# Patient Record
Sex: Male | Born: 1940
Health system: Southern US, Community
[De-identification: ages and names within clinical notes are randomized; demographics above are authoritative.]

## PROBLEM LIST (undated history)

## (undated) DIAGNOSIS — D689 Coagulation defect, unspecified: Secondary | ICD-10-CM

## (undated) DIAGNOSIS — D6862 Lupus anticoagulant syndrome: Secondary | ICD-10-CM

## (undated) DIAGNOSIS — Q211 Atrial septal defect: Secondary | ICD-10-CM

## (undated) DIAGNOSIS — Q2112 Patent foramen ovale: Secondary | ICD-10-CM

## (undated) DIAGNOSIS — I10 Essential (primary) hypertension: Secondary | ICD-10-CM

## (undated) DIAGNOSIS — R911 Solitary pulmonary nodule: Secondary | ICD-10-CM

## (undated) DIAGNOSIS — E785 Hyperlipidemia, unspecified: Secondary | ICD-10-CM

## (undated) DIAGNOSIS — Z87442 Personal history of urinary calculi: Secondary | ICD-10-CM

## (undated) DIAGNOSIS — M109 Gout, unspecified: Secondary | ICD-10-CM

## (undated) HISTORY — DX: Coagulation defect, unspecified: D68.9

## (undated) HISTORY — DX: Solitary pulmonary nodule: R91.1

## (undated) HISTORY — DX: Hyperlipidemia, unspecified: E78.5

## (undated) HISTORY — DX: Personal history of urinary calculi: Z87.442

## (undated) HISTORY — DX: Patent foramen ovale: Q21.12

## (undated) HISTORY — DX: Gout, unspecified: M10.9

## (undated) HISTORY — PX: THROMBECTOMY: SHX45

## (undated) HISTORY — DX: Atrial septal defect: Q21.1

## (undated) HISTORY — DX: Lupus anticoagulant syndrome: D68.62

## (undated) HISTORY — DX: Essential (primary) hypertension: I10

---

## 1999-05-26 DIAGNOSIS — Z87442 Personal history of urinary calculi: Secondary | ICD-10-CM

## 1999-05-26 HISTORY — DX: Personal history of urinary calculi: Z87.442

## 2008-02-01 ENCOUNTER — Ambulatory Visit: Payer: Self-pay | Admitting: Gastroenterology

## 2008-02-01 LAB — HM COLONOSCOPY: HM Colonoscopy: NORMAL

## 2009-05-25 DIAGNOSIS — R911 Solitary pulmonary nodule: Secondary | ICD-10-CM

## 2009-05-25 DIAGNOSIS — D689 Coagulation defect, unspecified: Secondary | ICD-10-CM

## 2009-05-25 HISTORY — DX: Solitary pulmonary nodule: R91.1

## 2009-05-25 HISTORY — DX: Coagulation defect, unspecified: D68.9

## 2009-12-23 ENCOUNTER — Ambulatory Visit: Payer: Self-pay | Admitting: Internal Medicine

## 2010-01-19 ENCOUNTER — Inpatient Hospital Stay: Payer: Self-pay | Admitting: Vascular Surgery

## 2010-01-21 LAB — PATHOLOGY REPORT

## 2010-01-23 ENCOUNTER — Ambulatory Visit: Payer: Self-pay | Admitting: Internal Medicine

## 2010-01-24 ENCOUNTER — Ambulatory Visit: Payer: Self-pay | Admitting: Internal Medicine

## 2010-02-04 ENCOUNTER — Ambulatory Visit: Payer: Self-pay | Admitting: Internal Medicine

## 2010-02-08 LAB — CEA: CEA: 1.6 ng/mL (ref 0.0–4.7)

## 2010-02-08 LAB — PSA

## 2010-02-22 ENCOUNTER — Ambulatory Visit: Payer: Self-pay | Admitting: Internal Medicine

## 2010-03-25 ENCOUNTER — Ambulatory Visit: Payer: Self-pay | Admitting: Internal Medicine

## 2010-04-24 ENCOUNTER — Ambulatory Visit: Payer: Self-pay | Admitting: Internal Medicine

## 2010-06-04 ENCOUNTER — Ambulatory Visit: Payer: Self-pay | Admitting: Internal Medicine

## 2010-06-11 ENCOUNTER — Ambulatory Visit: Payer: Self-pay | Admitting: Internal Medicine

## 2010-06-25 ENCOUNTER — Ambulatory Visit: Payer: Self-pay | Admitting: Internal Medicine

## 2010-07-24 ENCOUNTER — Ambulatory Visit: Payer: Self-pay | Admitting: Internal Medicine

## 2010-08-24 ENCOUNTER — Ambulatory Visit: Payer: Self-pay | Admitting: Internal Medicine

## 2010-10-08 ENCOUNTER — Ambulatory Visit: Payer: Self-pay | Admitting: Internal Medicine

## 2010-10-24 ENCOUNTER — Ambulatory Visit: Payer: Self-pay | Admitting: Internal Medicine

## 2011-04-22 ENCOUNTER — Ambulatory Visit (INDEPENDENT_AMBULATORY_CARE_PROVIDER_SITE_OTHER): Payer: Medicare Other | Admitting: Internal Medicine

## 2011-04-22 ENCOUNTER — Encounter: Payer: Self-pay | Admitting: Internal Medicine

## 2011-04-22 DIAGNOSIS — Z7901 Long term (current) use of anticoagulants: Secondary | ICD-10-CM

## 2011-04-22 DIAGNOSIS — Z79899 Other long term (current) drug therapy: Secondary | ICD-10-CM

## 2011-04-22 DIAGNOSIS — D689 Coagulation defect, unspecified: Secondary | ICD-10-CM | POA: Insufficient documentation

## 2011-04-22 DIAGNOSIS — R911 Solitary pulmonary nodule: Secondary | ICD-10-CM | POA: Insufficient documentation

## 2011-04-22 DIAGNOSIS — Z23 Encounter for immunization: Secondary | ICD-10-CM

## 2011-04-22 DIAGNOSIS — G4733 Obstructive sleep apnea (adult) (pediatric): Secondary | ICD-10-CM | POA: Insufficient documentation

## 2011-04-22 DIAGNOSIS — M109 Gout, unspecified: Secondary | ICD-10-CM | POA: Insufficient documentation

## 2011-04-22 DIAGNOSIS — I10 Essential (primary) hypertension: Secondary | ICD-10-CM

## 2011-04-22 DIAGNOSIS — Z1211 Encounter for screening for malignant neoplasm of colon: Secondary | ICD-10-CM | POA: Insufficient documentation

## 2011-04-22 DIAGNOSIS — Z87442 Personal history of urinary calculi: Secondary | ICD-10-CM | POA: Insufficient documentation

## 2011-04-22 NOTE — Patient Instructions (Addendum)
Return for your labs next week and we will call you with the results   You received your pneumonia vaccine today  Check with ble cross about the TdaP vaccine

## 2011-04-22 NOTE — Progress Notes (Signed)
Subjective:    Patient ID: Stephen Kelly, male    DOB: 09/08/1940, 70 y.o.   MRN: [id number]  HPI  Mr. Provencal is a 69 yo male who is here to establish care, referred by his son. He has a  history of a left upper arm DVT brachiocephalic vein which occurred spontaneously in January 19, 2010 treated with embolectomy by Festus Barren.  He continues to take anticoagulantsas advised by Dr. 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  His INRs have been between 2.0 and 3.0 consistently since Oct 2011, per review of list provided by patient today.  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  He also reports a history of an abnormal finding on ECHO , possibly a PFO (?) , which was reviewed by Arnoldo Hooker during ER evaluation.   Past Medical History  Diagnosis Date  . Clotting disorder 2011    history of DVT,   . Hypertension   . History of renal calculi 2001    history of lithotripsy ,  no stent  . Gout, arthritis     managed with allopurinol  . Pulmonary nodule, right 2011    unchanged, followed by gitten with serial ct   No current outpatient prescriptions on file prior to visit.     Review of Systems  Constitutional: Negative for fever, chills, diaphoresis, activity change, appetite change, fatigue and unexpected weight change.  HENT: Negative for hearing loss, ear pain, nosebleeds, congestion, sore throat, facial swelling, rhinorrhea, sneezing, drooling, mouth sores, trouble swallowing, neck pain, neck stiffness, dental problem, voice change, postnasal drip, sinus pressure, tinnitus and ear discharge.   Eyes: Negative for photophobia, pain, discharge, redness, itching and visual disturbance.  Respiratory: Negative for apnea, cough, choking, chest tightness, shortness of breath, wheezing and stridor.   Cardiovascular: Negative for chest pain, palpitations and leg swelling.  Gastrointestinal: Negative for nausea, vomiting, abdominal pain,  diarrhea, constipation, blood in stool, abdominal distention, anal bleeding and rectal pain.  Genitourinary: Negative for dysuria, urgency, frequency, hematuria, flank pain, decreased urine volume, scrotal swelling, difficulty urinating and testicular pain.  Musculoskeletal: Negative for myalgias, back pain, joint swelling, arthralgias and gait problem.  Skin: Negative for color change, rash and wound.  Neurological: Negative for dizziness, tremors, seizures, syncope, speech difficulty, weakness, light-headedness, numbness and headaches.  Psychiatric/Behavioral: Negative for suicidal ideas, hallucinations, behavioral problems, confusion, sleep disturbance, dysphoric mood, decreased concentration and agitation. The patient is not nervous/anxious.        Objective:   Physical Exam  Constitutional: He is oriented to person, place, and time.  HENT:  Head: Normocephalic and atraumatic.  Mouth/Throat: Oropharynx is clear and moist.  Eyes: Conjunctivae and EOM are normal.  Neck: Normal range of motion. Neck supple. No JVD present. No thyromegaly present.  Cardiovascular: Normal rate, regular rhythm and normal heart sounds.   Pulmonary/Chest: Effort normal and breath sounds normal. He has no wheezes. He has no rales.  Abdominal: Soft. Bowel sounds are normal. He exhibits no mass. There is no tenderness. There is no rebound.  Musculoskeletal: Normal range of motion. He exhibits no edema.  Neurological: He is alert and oriented to person, place, and time.  Skin: Skin is warm and dry.     Psychiatric: He has a normal mood and affect.          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  Records from [facility] will be requested to determine if he has a protein deficiency Factor V Leiden mutation, or AT III deficiency. I will manage his coumadin with monthly PT/INR going forward.  Hypertension Well  controlled on current medications.  No changes today.  Sleep apnea, obstructive He has not used CPAP in years since he lost 30 lbs.  He i snot interested in repeat sleep study at this time since he has good energy, restful sleep, and controlled HTN.     Updated Medication List Outpatient Encounter Prescriptions as of 04/22/2011  Medication Sig Dispense Refill  . allopurinol (ZYLOPRIM) 100 MG tablet Take 100 mg by mouth daily.        . calcium carbonate (OS-CAL) 600 MG TABS Take 600 mg by mouth 2 (two) times daily with a meal.        . fish oil-omega-3 fatty acids 1000 MG capsule Take 2 g by mouth daily.        [name] lisinopril-hydrochlorothiazide (PRINZIDE,ZESTORETIC) 10-12.5 MG per tablet Take 1 tablet by mouth daily.        . Multiple Vitamin (MULTIVITAMIN) tablet Take 1 tablet by mouth daily.        [name] warfarin (COUMADIN) 2.5 MG tablet Take 2.5 mg by mouth daily.

## 2011-04-23 ENCOUNTER — Encounter: Payer: Self-pay | Admitting: Internal Medicine

## 2011-04-23 DIAGNOSIS — I10 Essential (primary) hypertension: Secondary | ICD-10-CM | POA: Insufficient documentation

## 2011-04-23 NOTE — Assessment & Plan Note (Signed)
He has not used CPAP in years since he lost 30 lbs.  He i snot interested in repeat sleep study at this time since he has good energy, restful sleep, and controlled HTN.

## 2011-04-23 NOTE — Assessment & Plan Note (Signed)
Well controlled on current medications.  No changes today. 

## 2011-04-23 NOTE — Assessment & Plan Note (Signed)
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  Records from [facility] will be requested to determine if he has a protein deficiency Factor V Leiden mutation, or AT III deficiency. I will manage his coumadin with monthly PT/INR going forward.

## 2011-04-29 ENCOUNTER — Other Ambulatory Visit (INDEPENDENT_AMBULATORY_CARE_PROVIDER_SITE_OTHER): Payer: Medicare Other | Admitting: *Deleted

## 2011-04-29 DIAGNOSIS — Z79899 Other long term (current) drug therapy: Secondary | ICD-10-CM

## 2011-04-29 DIAGNOSIS — Z7901 Long term (current) use of anticoagulants: Secondary | ICD-10-CM

## 2011-04-29 DIAGNOSIS — Z23 Encounter for immunization: Secondary | ICD-10-CM

## 2011-04-29 LAB — COMPREHENSIVE METABOLIC PANEL
ALT: 24 U/L (ref 0–53)
AST: 23 U/L (ref 0–37)
Albumin: 3.9 g/dL (ref 3.5–5.2)
Alkaline Phosphatase: 39 U/L (ref 39–117)
BUN: 24 mg/dL — ABNORMAL HIGH (ref 6–23)
CO2: 29 mEq/L (ref 19–32)
Calcium: 9.1 mg/dL (ref 8.4–10.5)
Chloride: 104 mEq/L (ref 96–112)
Creatinine, Ser: 1.3 mg/dL (ref 0.4–1.5)
GFR: 56.05 mL/min — ABNORMAL LOW (ref >60.00)
Glucose, Bld: 127 mg/dL — ABNORMAL HIGH (ref 70–99)
Potassium: 3.9 mEq/L (ref 3.5–5.1)
Sodium: 139 mEq/L (ref 135–145)
Total Bilirubin: 0.5 mg/dL (ref 0.3–1.2)
Total Protein: 6.8 g/dL (ref 6.0–8.3)

## 2011-04-29 LAB — PROTIME-INR
INR: 2.2 ratio — ABNORMAL HIGH (ref 0.8–1.0)
Prothrombin Time: 24 s — ABNORMAL HIGH (ref 10.2–12.4)

## 2011-04-29 NOTE — Progress Notes (Signed)
Addended by: Melody Comas L on: 04/29/2011 11:46 AM   Modules accepted: Orders

## 2011-05-06 ENCOUNTER — Other Ambulatory Visit (INDEPENDENT_AMBULATORY_CARE_PROVIDER_SITE_OTHER): Payer: Medicare Other | Admitting: *Deleted

## 2011-05-06 DIAGNOSIS — Z131 Encounter for screening for diabetes mellitus: Secondary | ICD-10-CM

## 2011-05-06 DIAGNOSIS — E785 Hyperlipidemia, unspecified: Secondary | ICD-10-CM

## 2011-05-06 LAB — LIPID PANEL
Cholesterol: 168 mg/dL (ref 0–200)
HDL: 35.2 mg/dL — ABNORMAL LOW (ref >39.00)
Total CHOL/HDL Ratio: 5
Triglycerides: 232 mg/dL — ABNORMAL HIGH (ref 0.0–149.0)
VLDL: 46.4 mg/dL — ABNORMAL HIGH (ref 0.0–40.0)

## 2011-05-06 LAB — HEMOGLOBIN A1C
Hgb A1c MFr Bld: 6.5 % — ABNORMAL HIGH (ref <5.7)
Mean Plasma Glucose: 140 mg/dL — ABNORMAL HIGH (ref <117)

## 2011-05-06 LAB — POCT CBG (FASTING - GLUCOSE)-MANUAL ENTRY: Glucose Fasting, POC: 107 mg/dL — AB (ref 70–99)

## 2011-05-07 LAB — LDL CHOLESTEROL, DIRECT: Direct LDL: 84.5 mg/dL

## 2011-05-27 ENCOUNTER — Telehealth: Payer: Self-pay | Admitting: Internal Medicine

## 2011-05-27 NOTE — Telephone Encounter (Signed)
[phone] Pt called wanted to be seen today.  He has been coughing x3weeks.  Last night he started feeling congested in chest. No fever

## 2011-05-27 NOTE — Telephone Encounter (Signed)
Made appointment for 05/27/10 pt aware

## 2011-05-28 ENCOUNTER — Encounter: Payer: Self-pay | Admitting: Internal Medicine

## 2011-05-28 ENCOUNTER — Ambulatory Visit (INDEPENDENT_AMBULATORY_CARE_PROVIDER_SITE_OTHER): Payer: Medicare Other | Admitting: Internal Medicine

## 2011-05-28 ENCOUNTER — Encounter: Payer: Self-pay | Admitting: *Deleted

## 2011-05-28 VITALS — BP 120/70 | HR 76 | Temp 98.4°F | Wt 205.0 lb

## 2011-05-28 DIAGNOSIS — J40 Bronchitis, not specified as acute or chronic: Secondary | ICD-10-CM

## 2011-05-28 MED ORDER — AZITHROMYCIN 500 MG PO TABS: 500.0000 mg | ORAL_TABLET | Freq: Every day | ORAL | Status: AC

## 2011-05-28 MED ORDER — BENZONATATE 200 MG PO CAPS: 200.0000 mg | ORAL_CAPSULE | Freq: Two times a day (BID) | ORAL | Status: AC | PRN

## 2011-05-28 NOTE — Progress Notes (Signed)
Subjective:    Patient ID: Stephen Kelly, male    DOB: Sep 05, 1940, 71 y.o.   MRN: [id number]  HPI       Review of Systems     Objective:   Physical Exam  Constitutional: He is oriented to person, place, and time.  HENT:  Head: Normocephalic and atraumatic.  Mouth/Throat: Oropharynx is clear and moist.  Eyes: Conjunctivae and EOM are normal.  Neck: Normal range of motion. Neck supple. No JVD present. No thyromegaly present.  Cardiovascular: Normal rate, regular rhythm and normal heart sounds.   Pulmonary/Chest: Effort normal and breath sounds normal. He has no wheezes. He has no rales.  Abdominal: Soft. Bowel sounds are normal. He exhibits no mass. There is no tenderness. There is no rebound.  Musculoskeletal: Normal range of motion. He exhibits no edema.  Neurological: He is alert and oriented to person, place, and time.  Skin: Skin is warm and dry.  Psychiatric: He has a normal mood and affect.          Assessment & Plan:   Subjective:     Stephen Kelly is a 71 y.o. male who presents with new onset of Type 2 diabetes.. Current symptoms include: none. Patient denies foot ulcerations, increased appetite, nausea, polydipsia and visual disturbances. Evaluation to date has been: fasting blood sugar, fasting lipid panel and hemoglobin A1C. Home sugars: patient does not check sugars. Current treatments: no recent interventions. Last dilated eye exam one month ago.  He is being seen urgent for a 3 week history of cough which is worse during the day.[name]  He reports scratchy throat,  Sinuses are nonpainful and not congested.  Cough is nonproductive. No recent travel.  Denies chest pain, shortness of breath. No coughing after eating.    The following portions of the patient's history were reviewed and updated as appropriate: allergies, current medications, past family history, past medical history, past social history, past surgical history and problem list.  Review of Systems A  comprehensive review of systems was negative.    Objective:    Ears: normal TM's and external ear canals both ears Nose: Nares normal. Septum midline. Mucosa normal. No drainage or sinus tenderness. Throat: lips, mucosa, and tongue normal; teeth and gums normal Neck: JVD - 0 cm above sternal notch, no adenopathy, no carotid bruit, no JVD, supple, symmetrical, trachea midline and thyroid not enlarged, symmetric, no tenderness/mass/nodules Lungs: clear to auscultation bilaterally Chest wall: no tenderness Heart: regular rate and rhythm, S1, S2 normal, no murmur, click, rub or gallop    @DMFOOTEXAM @  Patient was not evaluated for proper footwear and sizing.  Laboratory: No components found with this basename: A1C      Assessment:    Diabetes mellitus Type II, under excellent control.    Bronchitis:  Given persistence of symptoms for 3 weeks, will treat with azithromycin x 1 week and obtain chest x ray if no improvement.  Will also consider suspending lisinopril if cxr is negative.   Updated Medication List Outpatient Encounter Prescriptions as of 05/28/2011  Medication Sig Dispense Refill  . allopurinol (ZYLOPRIM) 100 MG tablet Take 100 mg by mouth daily.        . calcium carbonate (OS-CAL) 600 MG TABS Take 600 mg by mouth 2 (two) times daily with a meal.        . fish oil-omega-3 fatty acids 1000 MG capsule Take 2 g by mouth daily.        [name] lisinopril-hydrochlorothiazide (PRINZIDE,ZESTORETIC) 10-12.5 MG  per tablet Take 1 tablet by mouth daily.        . Multiple Vitamin (MULTIVITAMIN) tablet Take 1 tablet by mouth daily.        [name] warfarin (COUMADIN) 2.5 MG tablet Take 2.5 mg by mouth daily.        [name] azithromycin (ZITHROMAX) 500 MG tablet Take 1 tablet (500 mg total) by mouth daily.  7 tablet  0  . benzonatate (TESSALON) 200 MG capsule Take 1 capsule (200 mg total) by mouth 2 (two) times daily as needed for cough.  20 capsule  0       Plan:    Discussed general issues about  diabetes pathophysiology and management. Counseling at today's visit: discussed the need for weight loss, focused on the need for regular aerobic exercise and discussed the advantages of a diet low in carbohydrates. Addressed ADA diet. Encouraged aerobic exercise. Reminded to get yearly retinal exam.

## 2011-05-28 NOTE — Patient Instructions (Signed)
Your diabetes is well controlled currently.  You do not need medications.    You need daily exercise for 20 minutes and limit your starches  To two servings per day.    Read about the low glycemic index  diet .    I am prescribing azithromycin 500 mg daily for 7 days.  If the cough does not resolve,  Call me to set up an x ray

## 2011-06-01 ENCOUNTER — Ambulatory Visit: Payer: Medicare Other | Admitting: Internal Medicine

## 2011-06-01 ENCOUNTER — Other Ambulatory Visit (INDEPENDENT_AMBULATORY_CARE_PROVIDER_SITE_OTHER): Payer: Medicare Other | Admitting: *Deleted

## 2011-06-01 DIAGNOSIS — Z7901 Long term (current) use of anticoagulants: Secondary | ICD-10-CM

## 2011-06-01 LAB — PROTIME-INR
INR: 2.4 ratio — ABNORMAL HIGH (ref 0.8–1.0)
Prothrombin Time: 26.7 s — ABNORMAL HIGH (ref 10.2–12.4)

## 2011-07-03 ENCOUNTER — Other Ambulatory Visit (INDEPENDENT_AMBULATORY_CARE_PROVIDER_SITE_OTHER): Payer: Medicare Other | Admitting: *Deleted

## 2011-07-03 DIAGNOSIS — Z7901 Long term (current) use of anticoagulants: Secondary | ICD-10-CM

## 2011-07-03 LAB — PROTIME-INR
INR: 2.2 ratio — ABNORMAL HIGH (ref 0.8–1.0)
Prothrombin Time: 24.4 s — ABNORMAL HIGH (ref 10.2–12.4)

## 2011-07-31 ENCOUNTER — Other Ambulatory Visit (INDEPENDENT_AMBULATORY_CARE_PROVIDER_SITE_OTHER): Payer: Medicare Other

## 2011-07-31 DIAGNOSIS — Z7901 Long term (current) use of anticoagulants: Secondary | ICD-10-CM

## 2011-07-31 LAB — PROTIME-INR
INR: 1.9 ratio — ABNORMAL HIGH (ref 0.8–1.0)
Prothrombin Time: 20.6 s — ABNORMAL HIGH (ref 10.2–12.4)

## 2011-08-31 ENCOUNTER — Ambulatory Visit (INDEPENDENT_AMBULATORY_CARE_PROVIDER_SITE_OTHER): Payer: Medicare Other | Admitting: Internal Medicine

## 2011-08-31 ENCOUNTER — Encounter: Payer: Self-pay | Admitting: Internal Medicine

## 2011-08-31 VITALS — BP 124/62 | HR 70 | Temp 97.8°F | Resp 16 | Wt 205.8 lb

## 2011-08-31 DIAGNOSIS — I10 Essential (primary) hypertension: Secondary | ICD-10-CM

## 2011-08-31 DIAGNOSIS — Q2112 Patent foramen ovale: Secondary | ICD-10-CM | POA: Insufficient documentation

## 2011-08-31 DIAGNOSIS — E1121 Type 2 diabetes mellitus with diabetic nephropathy: Secondary | ICD-10-CM | POA: Insufficient documentation

## 2011-08-31 DIAGNOSIS — Q211 Atrial septal defect: Secondary | ICD-10-CM | POA: Insufficient documentation

## 2011-08-31 DIAGNOSIS — Z7901 Long term (current) use of anticoagulants: Secondary | ICD-10-CM

## 2011-08-31 DIAGNOSIS — R911 Solitary pulmonary nodule: Secondary | ICD-10-CM

## 2011-08-31 DIAGNOSIS — D6862 Lupus anticoagulant syndrome: Secondary | ICD-10-CM | POA: Insufficient documentation

## 2011-08-31 DIAGNOSIS — L570 Actinic keratosis: Secondary | ICD-10-CM | POA: Insufficient documentation

## 2011-08-31 DIAGNOSIS — E119 Type 2 diabetes mellitus without complications: Secondary | ICD-10-CM

## 2011-08-31 DIAGNOSIS — M109 Gout, unspecified: Secondary | ICD-10-CM

## 2011-08-31 DIAGNOSIS — I742 Embolism and thrombosis of arteries of the upper extremities: Secondary | ICD-10-CM

## 2011-08-31 DIAGNOSIS — L989 Disorder of the skin and subcutaneous tissue, unspecified: Secondary | ICD-10-CM

## 2011-08-31 LAB — COMPREHENSIVE METABOLIC PANEL
ALT: 21 U/L (ref 0–53)
AST: 24 U/L (ref 0–37)
Albumin: 4.1 g/dL (ref 3.5–5.2)
Alkaline Phosphatase: 34 U/L — ABNORMAL LOW (ref 39–117)
BUN: 24 mg/dL — ABNORMAL HIGH (ref 6–23)
CO2: 25 mEq/L (ref 19–32)
Calcium: 9.9 mg/dL (ref 8.4–10.5)
Chloride: 102 mEq/L (ref 96–112)
Creat: 1.28 mg/dL (ref 0.50–1.35)
Glucose, Bld: 184 mg/dL — ABNORMAL HIGH (ref 70–99)
Potassium: 3.9 mEq/L (ref 3.5–5.3)
Sodium: 137 mEq/L (ref 135–145)
Total Bilirubin: 0.3 mg/dL (ref 0.3–1.2)
Total Protein: 7.2 g/dL (ref 6.0–8.3)

## 2011-08-31 NOTE — Progress Notes (Signed)
Addended by: Jobie Quaker on: 08/31/2011 02:20 PM   Modules accepted: Orders

## 2011-08-31 NOTE — Assessment & Plan Note (Signed)
On allopurinol, recent uric acid level checked by other physician after a gout flare. Records requested.

## 2011-08-31 NOTE — Progress Notes (Signed)
Patient ID: Stephen Kelly, male   DOB: 08-Jan-1941, 71 y.o.   MRN: [id number]  Patient Active Problem List  Diagnoses  . Clotting disorder  . History of renal calculi  . Gout, arthritis  . Pulmonary nodule, right  . Screening for colon cancer  . Sleep apnea, obstructive  . Hypertension  . Diabetes mellitus  . 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 Skin lesion    Subjective:  CC:   Chief Complaint  Patient presents with  . Follow-up    HPI:   Stephen Kelly a 71 y.o. male who presents   Follow up on chronic conditions including DVT/Pe in 2011 and recent onset DM.  Eats whatever he wants. Weight stable.  Not too concerned about the diabetes.  Understands it may get worse with time and require medication.  No hypoglycemic events.  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 , as far as [place], [place].  Takes coumadin,  No recent bruising or bleeding.  Last hgba1c 6.5 .  Due for PT.  Has question about several skin lesions: foreheaf, bilateral, right temple   Chest wall (2)  Past Medical History  Diagnosis Date  . Clotting disorder 2011    history of DVT,   . Hypertension   . History of renal calculi 2001    history of lithotripsy ,  no stent  . Gout, arthritis     managed with allopurinol  . Pulmonary nodule, right 2011    unchanged, followed by gitten with serial ct    Past Surgical History  Procedure Date  . Thrombectomy     left brachicephalic vein         The following portions of the patient's history were reviewed and updated as appropriate: Allergies, current medications, and problem list.    Review of Systems:   12 Pt  review of systems was negative except those addressed in the HPI,     History   Social History  . Marital Status: Married    Spouse Name: N/A    Number of Children: N/A  . Years of Education: N/A   Occupational History  . Not on file.   Social History Main Topics  . Smoking  status: Never Smoker   . Smokeless tobacco: Never Used  . Alcohol Use: No  . Drug Use: No  . Sexually Active: Not on file   Other Topics Concern  . Not on file   Social History Narrative  . No narrative on file    Objective:  BP 124/62  Pulse 70  Temp(Src) 97.8 F (36.6 C) (Oral)  Resp 16  Wt 205 lb 12 oz (93.328 kg)  SpO2 96%  General appearance: alert, cooperative and appears stated age Ears: normal TM's and external ear canals both ears Throat: lips, mucosa, and tongue normal; teeth and gums normal Neck: no adenopathy, no carotid bruit, supple, symmetrical, trachea midline and thyroid not enlarged, symmetric, no tenderness/mass/nodules Back: symmetric, no curvature. ROM normal. No CVA tenderness. Lungs: clear to auscultation bilaterally Heart: regular rate and rhythm, S1, S2 normal, no murmur, click, rub or gallop Abdomen: soft, non-tender; bowel sounds normal; no masses,  no organomegaly Pulses: 2+ and symmetric Skin: Skin color, texture, turgor normal. No rashes or lesions Lymph nodes: Cervical, supraclavicular, and axillary nodes normal.  Assessment and Plan:  Hypertension Well controlled on current medications.  No changes today.  Gout, arthritis On allopurinol, recent uric acid level checked  by other physician after a gout flare. Records requested.   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 But wife dose most of cooking. If repeat a1c is > 7.0  Will bring wife in for educations and start metformin.   Skin lesion He has multiple areas of concern,  bilateral forehead and temple have signs of sun damage, and possible AK.  Chest has two large macular hyperpigmented areas with irregular border.  Refer to [facility] for evaluation of all.     Updated Medication List Outpatient Encounter Prescriptions as of 08/31/2011    Medication Sig Dispense Refill  . allopurinol (ZYLOPRIM) 100 MG tablet Take 100 mg by mouth daily.        . calcium carbonate (OS-CAL) 600 MG TABS Take 600 mg by mouth 2 (two) times daily with a meal.        . fish oil-omega-3 fatty acids 1000 MG capsule Take 2 g by mouth daily.        [name] lisinopril-hydrochlorothiazide (PRINZIDE,ZESTORETIC) 10-12.5 MG per tablet Take 1 tablet by mouth daily.        . Multiple Vitamin (MULTIVITAMIN) tablet Take 1 tablet by mouth daily.        [name] warfarin (COUMADIN) 2.5 MG tablet Take 2.5 mg by mouth daily.           Orders Placed This Encounter  Procedures  . COMPLETE METABOLIC PANEL WITH GFR  . Hemoglobin A1c  . Microalbumin / creatinine urine ratio  . Ambulatory referral to Dermatology  . HM COLONOSCOPY    Return in about 3 months (around 11/30/2011).

## 2011-08-31 NOTE — Progress Notes (Signed)
Addended by: Jobie Quaker on: 08/31/2011 05:06 PM   Modules accepted: Orders

## 2011-08-31 NOTE — Assessment & Plan Note (Signed)
Patient counseled on low glycemic index diet. But wife dose most of cooking. If repeat a1c is > 7.0  Will bring wife in for educations and start metformin.

## 2011-08-31 NOTE — Assessment & Plan Note (Signed)
He has multiple areas of concern,  bilateral forehead and temple have signs of sun damage, and possible AK.  Chest has two large macular hyperpigmented areas with irregular border.  Refer to [facility] for evaluation of all.

## 2011-08-31 NOTE — Assessment & Plan Note (Signed)
Last seen by Festus Barren Dec,  Next visit June,  Continue coumadin

## 2011-08-31 NOTE — Assessment & Plan Note (Signed)
Well controlled on current medications.  No changes today. 

## 2011-08-31 NOTE — Progress Notes (Signed)
Addended by: Jobie Quaker on: 08/31/2011 02:39 PM   Modules accepted: Orders

## 2011-08-31 NOTE — Assessment & Plan Note (Signed)
Followed with serial CTs, last one ordered in June 2012 by gitten,  Likely benign nodules,.  With evidence of granulomatous disease suggested.

## 2011-08-31 NOTE — Patient Instructions (Addendum)
You need to have a dilated eye exam every year because of the new diagnosis of diabetes.    We will check your hgba1c every 3 month to make sure diabetes stays under control with diet.   I recommend limiting your starches to one serving per day  As long as you also switch your breads to lower carb alternatives:[name]  Arnold's Sandwich thins (everywhere) Joseph's flatbread and Pita bread ([employer] and [employer])  [employer] make whole wheat low carb tortillas (found in most grocery stores)  Food [employer] carries a brand Toufayah (low carb wrap)   For ice cream:  Look for no sugar added varieties.  Skinny Cow,  Weight Watchers,  Breyer's carb smart  Moderation in all things!!!!!  Referral to [facility] to look at your forehead, temple and chest spots.   Try generic zantac 150 mg in the evening to see if the cough at night is due to reflux.

## 2011-09-01 LAB — COMPLETE METABOLIC PANEL WITH GFR
ALT: 18 U/L (ref 0–53)
AST: 20 U/L (ref 0–37)
Albumin: 4.1 g/dL (ref 3.5–5.2)
Alkaline Phosphatase: 35 U/L — ABNORMAL LOW (ref 39–117)
BUN: 24 mg/dL — ABNORMAL HIGH (ref 6–23)
CO2: 27 mEq/L (ref 19–32)
Calcium: 9.8 mg/dL (ref 8.4–10.5)
Chloride: 102 mEq/L (ref 96–112)
Creat: 1.33 mg/dL (ref 0.50–1.35)
GFR, Est African American: 62 mL/min
GFR, Est Non African American: 54 mL/min — ABNORMAL LOW
Glucose, Bld: 185 mg/dL — ABNORMAL HIGH (ref 70–99)
Potassium: 3.9 mEq/L (ref 3.5–5.3)
Sodium: 138 mEq/L (ref 135–145)
Total Bilirubin: 0.4 mg/dL (ref 0.3–1.2)
Total Protein: 6.9 g/dL (ref 6.0–8.3)

## 2011-09-01 LAB — PROTIME-INR
INR: 2.21 — ABNORMAL HIGH (ref <1.50)
Prothrombin Time: 25.3 seconds — ABNORMAL HIGH (ref 11.6–15.2)

## 2011-09-01 LAB — MICROALBUMIN / CREATININE URINE RATIO
Creatinine, Urine: 57.6 mg/dL
Microalb Creat Ratio: 32.1 mg/g — ABNORMAL HIGH (ref 0.0–30.0)
Microalb, Ur: 1.85 mg/dL (ref 0.00–1.89)

## 2011-09-01 LAB — HEMOGLOBIN A1C
Hgb A1c MFr Bld: 6.6 % — ABNORMAL HIGH (ref <5.7)
Mean Plasma Glucose: 143 mg/dL — ABNORMAL HIGH (ref <117)

## 2011-09-29 ENCOUNTER — Other Ambulatory Visit (INDEPENDENT_AMBULATORY_CARE_PROVIDER_SITE_OTHER): Payer: Medicare Other | Admitting: *Deleted

## 2011-09-29 DIAGNOSIS — Z7901 Long term (current) use of anticoagulants: Secondary | ICD-10-CM

## 2011-09-29 LAB — PROTIME-INR
INR: 2.4 ratio — ABNORMAL HIGH (ref 0.8–1.0)
Prothrombin Time: 26.5 s — ABNORMAL HIGH (ref 10.2–12.4)

## 2011-10-27 ENCOUNTER — Other Ambulatory Visit (INDEPENDENT_AMBULATORY_CARE_PROVIDER_SITE_OTHER): Payer: Medicare Other | Admitting: *Deleted

## 2011-10-27 DIAGNOSIS — Z7901 Long term (current) use of anticoagulants: Secondary | ICD-10-CM

## 2011-10-27 LAB — PROTIME-INR
INR: 2.4 ratio — ABNORMAL HIGH (ref 0.8–1.0)
Prothrombin Time: 26.6 s — ABNORMAL HIGH (ref 10.2–12.4)

## 2011-12-01 ENCOUNTER — Ambulatory Visit (INDEPENDENT_AMBULATORY_CARE_PROVIDER_SITE_OTHER): Payer: Medicare Other | Admitting: Internal Medicine

## 2011-12-01 ENCOUNTER — Ambulatory Visit (INDEPENDENT_AMBULATORY_CARE_PROVIDER_SITE_OTHER)
Admission: RE | Admit: 2011-12-01 | Discharge: 2011-12-01 | Disposition: A | Discharge status: Home / Self Care | Payer: Medicare Other | Source: Ambulatory Visit | Attending: Internal Medicine | Admitting: Internal Medicine

## 2011-12-01 ENCOUNTER — Encounter: Payer: Self-pay | Admitting: Internal Medicine

## 2011-12-01 VITALS — BP 112/68 | HR 76 | Temp 98.4°F | Resp 16 | Wt 206.5 lb

## 2011-12-01 DIAGNOSIS — R05 Cough: Secondary | ICD-10-CM

## 2011-12-01 DIAGNOSIS — M109 Gout, unspecified: Secondary | ICD-10-CM

## 2011-12-01 DIAGNOSIS — R059 Cough, unspecified: Secondary | ICD-10-CM

## 2011-12-01 DIAGNOSIS — N179 Acute kidney failure, unspecified: Secondary | ICD-10-CM

## 2011-12-01 DIAGNOSIS — D689 Coagulation defect, unspecified: Secondary | ICD-10-CM

## 2011-12-01 DIAGNOSIS — I10 Essential (primary) hypertension: Secondary | ICD-10-CM

## 2011-12-01 DIAGNOSIS — L989 Disorder of the skin and subcutaneous tissue, unspecified: Secondary | ICD-10-CM

## 2011-12-01 DIAGNOSIS — Z7901 Long term (current) use of anticoagulants: Secondary | ICD-10-CM

## 2011-12-01 DIAGNOSIS — E119 Type 2 diabetes mellitus without complications: Secondary | ICD-10-CM

## 2011-12-01 LAB — PROTIME-INR
INR: 2.1 — ABNORMAL HIGH (ref <1.50)
Prothrombin Time: 24.3 seconds — ABNORMAL HIGH (ref 11.6–15.2)

## 2011-12-01 NOTE — Progress Notes (Signed)
Patient ID: Stephen Kelly, male   DOB: 1941/01/15, 71 y.o.   MRN: [id number]  Patient Active Problem List  Diagnosis  . Clotting disorder  . History of renal calculi  . Gout, arthritis  . Pulmonary nodule, right  . Screening for colon cancer  . Sleep apnea, obstructive  . Hypertension  . Diabetes mellitus type 2, controlled  . Radial artery thrombosis, right  . Actinic keratosis of left side of forehead  . Patent foramen ovale  . Lupus anticoagulant inhibitor syndrome  . Cough    Subjective:  CC:   Chief Complaint  Patient presents with  . Follow-up    3 month    HPI:   Stephen Brittian Payneis a 71 y.o. male who presents  For follow up on chronic issues of diabetes, hypertension and hyperlipidemia.  He was treated recently by Dr. Charlsie Merles for foot pain secondary to gout vs OA with a prednisone taper and a steroid shot.   He was a former patient of Dr.  Jarold Motto for scalp lesions due to some type of hair root problems  Treated with T gel XS.  Which helped, and was recently treated by Willeen Niece [facility] for AKs and irritated SKs and was not happy with the process bc informed consent was not obtained, per patient.  Was not told what to expect, and his forehead skin peeled off after a bout a week or so. Wants to see Dr. Adolphus Birchwood for follow up.  He is requesting refills on all med's, prefers 90 days with 3 refills. blood sugars have been well controlled, fastings < 130. No lows.   Past Medical History  Diagnosis Date  . Hypertension   . History of renal calculi 2001    history of lithotripsy ,  no stent  . Gout, arthritis     managed with allopurinol  . Pulmonary nodule, right 2011    unchanged, followed by gitten with serial ct  . Patent foramen ovale   . Lupus anticoagulant inhibitor syndrome     per gitten's test March 2012  . Clotting disorder 2011    Lupus Inhibitor positive by March 2012 studies    Past Surgical History  Procedure Date  . Thrombectomy     left  brachicephalic artery         The following portions of the patient's history were reviewed and updated as appropriate: Allergies, current medications, and problem list.    Review of Systems:   12 Pt  review of systems was negative except those addressed in the HPI,     History   Social History  . Marital Status: Married    Spouse Name: N/A    Number of Children: N/A  . Years of Education: N/A   Occupational History  . Not on file.   Social History Main Topics  . Smoking status: Never Smoker   . Smokeless tobacco: Never Used  . Alcohol Use: No  . Drug Use: No  . Sexually Active: Not on file   Other Topics Concern  . Not on file   Social History Narrative  . No narrative on file    Objective:  BP 112/68  Pulse 76  Temp 98.4 F (36.9 C) (Oral)  Resp 16  Wt 206 lb 8 oz (93.668 kg)  SpO2 98%  General appearance: alert, cooperative and appears stated age Ears: normal TM's and external ear canals both ears Throat: lips, mucosa, and tongue normal; teeth and gums normal Neck: no  adenopathy, no carotid bruit, supple, symmetrical, trachea midline and thyroid not enlarged, symmetric, no tenderness/mass/nodules Back: symmetric, no curvature. ROM normal. No CVA tenderness. Lungs: clear to auscultation bilaterally Heart: regular rate and rhythm, S1, S2 normal, no murmur, click, rub or gallop Abdomen: soft, non-tender; bowel sounds normal; no masses,  no organomegaly Pulses: 2+ and symmetric Skin: Skin color, texture, turgor normal. No rashes or lesions Lymph nodes: Cervical, supraclavicular, and axillary nodes normal. Foot exam:  Nails are well trimmed,  No callouses,  Sensation intact to microfilament  Assessment and Plan:  Skin lesion Recently treated for AKs and SK by Willeen Niece.  Forehead lesions no resolved, skin intact  Gout, arthritis Treated with prednisone by podiatry recently.  Has one attacks per year. Continue allopurinol  Cough Nocturnal,  not resolved with PPI trial.  CXR ordered.  If normal will try antihistamine followed by PFTs  Diabetes mellitus type 2, controlled Well controlled previously without medications.  Hgba1c is due, he is up to date on eye exams.  Foot exam done,  Sees Dr. Charlsie Merles for toenail and gout care.   Hypertension Well controlled on current medications.  No changes today.  Clotting disorder PT INR due today for chronic anticoagulation   Updated Medication List Outpatient Encounter Prescriptions as of 12/01/2011  Medication Sig Dispense Refill  . allopurinol (ZYLOPRIM) 100 MG tablet Take 1 tablet (100 mg total) by mouth daily.  90 tablet  3  . calcium carbonate (OS-CAL) 600 MG TABS Take 600 mg by mouth 2 (two) times daily with a meal.        . fish oil-omega-3 fatty acids 1000 MG capsule Take 2 g by mouth daily.        [name] lisinopril-hydrochlorothiazide (PRINZIDE,ZESTORETIC) 10-12.5 MG per tablet Take 1 tablet by mouth daily.  90 tablet  3  . Multiple Vitamin (MULTIVITAMIN) tablet Take 1 tablet by mouth daily.        [name] warfarin (COUMADIN) 2.5 MG tablet Take 1 tablet (2.5 mg total) by mouth daily.  90 tablet  3  . DISCONTD: allopurinol (ZYLOPRIM) 100 MG tablet Take 100 mg by mouth daily.        [name] DISCONTD: lisinopril-hydrochlorothiazide (PRINZIDE,ZESTORETIC) 10-12.5 MG per tablet Take 1 tablet by mouth daily.        [name] DISCONTD: warfarin (COUMADIN) 2.5 MG tablet Take 2.5 mg by mouth daily.           Orders Placed This Encounter  Procedures  . DG Chest 2 View  . COMPLETE METABOLIC PANEL WITH GFR  . Sedimentation rate  . Protime-INR  . Hemoglobin A1c  . Microalbumin / creatinine urine ratio  . Sedimentation rate  . Microalbumin, urine  . Hemoglobin A1c  . Comp Met (CMET)  . INR/PT  . Sedimentation rate  . Microalbumin, urine  . INR/PT  . Hemoglobin A1c  . Comp Met (CMET)  . Sedimentation rate  . Hemoglobin A1c  . Comprehensive metabolic panel  . Microalbumin, urine  . INR/PT    Return in  about 3 months (around 03/02/2012).

## 2011-12-02 ENCOUNTER — Telehealth: Payer: Self-pay | Admitting: Internal Medicine

## 2011-12-02 ENCOUNTER — Other Ambulatory Visit: Payer: Self-pay | Admitting: Internal Medicine

## 2011-12-02 LAB — COMPREHENSIVE METABOLIC PANEL
ALT: 24 U/L (ref 0–53)
AST: 26 U/L (ref 0–37)
Albumin: 4 g/dL (ref 3.5–5.2)
Alkaline Phosphatase: 36 U/L — ABNORMAL LOW (ref 39–117)
BUN: 27 mg/dL — ABNORMAL HIGH (ref 6–23)
CO2: 29 mEq/L (ref 19–32)
Calcium: 9.6 mg/dL (ref 8.4–10.5)
Chloride: 101 mEq/L (ref 96–112)
Creatinine, Ser: 1.6 mg/dL — ABNORMAL HIGH (ref 0.4–1.5)
GFR: 45.6 mL/min — ABNORMAL LOW (ref >60.00)
Glucose, Bld: 123 mg/dL — ABNORMAL HIGH (ref 70–99)
Potassium: 4.4 mEq/L (ref 3.5–5.1)
Sodium: 138 mEq/L (ref 135–145)
Total Bilirubin: 0.5 mg/dL (ref 0.3–1.2)
Total Protein: 7.2 g/dL (ref 6.0–8.3)

## 2011-12-02 LAB — MICROALBUMIN, URINE: Microalb, Ur: 1.1 mg/dL (ref 0.00–1.89)

## 2011-12-02 LAB — HEMOGLOBIN A1C: Hgb A1c MFr Bld: 6.4 % (ref 4.6–6.5)

## 2011-12-02 LAB — SEDIMENTATION RATE: Sed Rate: 10 mm/hr (ref 0–22)

## 2011-12-02 MED ORDER — ALLOPURINOL 100 MG PO TABS: 100.0000 mg | ORAL_TABLET | Freq: Every day | ORAL | Status: DC

## 2011-12-02 MED ORDER — WARFARIN SODIUM 2.5 MG PO TABS: 2.5000 mg | ORAL_TABLET | Freq: Every day | ORAL | Status: DC

## 2011-12-02 MED ORDER — PREDNISONE (PAK) 10 MG PO TABS: ORAL_TABLET | ORAL | Status: AC

## 2011-12-02 MED ORDER — AZITHROMYCIN 250 MG PO TABS: ORAL_TABLET | ORAL | Status: AC

## 2011-12-02 MED ORDER — LISINOPRIL-HYDROCHLOROTHIAZIDE 10-12.5 MG PO TABS: 1.0000 | ORAL_TABLET | Freq: Every day | ORAL | Status: DC

## 2011-12-02 NOTE — Assessment & Plan Note (Signed)
Well controlled previously without medications.  Hgba1c is due, he is up to date on eye exams.  Foot exam done,  Sees Dr. Charlsie Merles for toenail and gout care.

## 2011-12-02 NOTE — Assessment & Plan Note (Addendum)
Treated with prednisone by podiatry recently.  Has one attacks per year. Continue allopurinol

## 2011-12-02 NOTE — Assessment & Plan Note (Signed)
Recently treated for AKs and SK by Willeen Niece.  Forehead lesions no resolved, skin intact

## 2011-12-02 NOTE — Assessment & Plan Note (Signed)
Well controlled on current medications.  No changes today. 

## 2011-12-02 NOTE — Assessment & Plan Note (Signed)
PT INR due today for chronic anticoagulation

## 2011-12-02 NOTE — Telephone Encounter (Signed)
Make sure he suspends coumadin for 2 days during treatment for bronchitis to keep blood from getting too thin

## 2011-12-02 NOTE — Assessment & Plan Note (Signed)
Nocturnal, not resolved with PPI trial.  CXR ordered.  If normal will try antihistamine followed by PFTs

## 2011-12-03 NOTE — Telephone Encounter (Signed)
Patient notified

## 2011-12-04 ENCOUNTER — Telehealth: Payer: Self-pay | Admitting: Internal Medicine

## 2011-12-04 NOTE — Telephone Encounter (Signed)
No, he should stop it, if he is still coughing on Monday I will call in an new abx

## 2011-12-04 NOTE — Telephone Encounter (Signed)
Patient stated he started the azithromycin last night and when he woke up this morning he has a rash on his face.  He wanted to know if you wanted him to continue the medication, he has 3 days left.  He stated he has never taken azithromycin before and that was the only new thing he started.  Please advise.

## 2011-12-04 NOTE — Addendum Note (Signed)
Addended by: Duncan Dull on: 12/04/2011 06:53 AM   Modules accepted: Orders

## 2011-12-04 NOTE — Telephone Encounter (Signed)
Patient notified

## 2011-12-08 ENCOUNTER — Telehealth: Payer: Self-pay | Admitting: *Deleted

## 2011-12-08 ENCOUNTER — Other Ambulatory Visit (INDEPENDENT_AMBULATORY_CARE_PROVIDER_SITE_OTHER): Payer: Medicare Other | Admitting: *Deleted

## 2011-12-08 DIAGNOSIS — N179 Acute kidney failure, unspecified: Secondary | ICD-10-CM

## 2011-12-08 LAB — BASIC METABOLIC PANEL
BUN: 34 mg/dL — ABNORMAL HIGH (ref 6–23)
CO2: 31 mEq/L (ref 19–32)
Calcium: 9.1 mg/dL (ref 8.4–10.5)
Chloride: 100 mEq/L (ref 96–112)
Creatinine, Ser: 1.3 mg/dL (ref 0.4–1.5)
GFR: 60.07 mL/min (ref >60.00)
Glucose, Bld: 155 mg/dL — ABNORMAL HIGH (ref 70–99)
Potassium: 3.8 mEq/L (ref 3.5–5.1)
Sodium: 139 mEq/L (ref 135–145)

## 2011-12-08 NOTE — Telephone Encounter (Signed)
Patient came in for labs today and while he was here he was stating that when he came in last week he had bronchitis. He has now finished the meds that were given to him, but he is still coughing quite a bit and feels that there is mucus in her chest. He is asking if he needs to give it more time or if he needs to take another round of antibiotics. Please advise.

## 2011-12-08 NOTE — Telephone Encounter (Signed)
nomore antibiotics.  Give it time

## 2011-12-09 NOTE — Telephone Encounter (Signed)
Patient notified

## 2012-01-04 ENCOUNTER — Other Ambulatory Visit (INDEPENDENT_AMBULATORY_CARE_PROVIDER_SITE_OTHER): Payer: Medicare Other | Admitting: *Deleted

## 2012-01-04 DIAGNOSIS — Z7901 Long term (current) use of anticoagulants: Secondary | ICD-10-CM

## 2012-01-04 LAB — PROTIME-INR
INR: 2.4 ratio — ABNORMAL HIGH (ref 0.8–1.0)
Prothrombin Time: 26.3 s — ABNORMAL HIGH (ref 10.2–12.4)

## 2012-02-08 ENCOUNTER — Other Ambulatory Visit (INDEPENDENT_AMBULATORY_CARE_PROVIDER_SITE_OTHER): Payer: Medicare Other

## 2012-02-08 DIAGNOSIS — Z7901 Long term (current) use of anticoagulants: Secondary | ICD-10-CM

## 2012-02-08 LAB — PROTIME-INR
INR: 2.4 ratio — ABNORMAL HIGH (ref 0.8–1.0)
Prothrombin Time: 26.8 s — ABNORMAL HIGH (ref 10.2–12.4)

## 2012-03-02 ENCOUNTER — Ambulatory Visit (INDEPENDENT_AMBULATORY_CARE_PROVIDER_SITE_OTHER): Payer: Medicare Other | Admitting: Internal Medicine

## 2012-03-02 ENCOUNTER — Encounter: Payer: Self-pay | Admitting: Internal Medicine

## 2012-03-02 VITALS — BP 118/66 | HR 69 | Temp 97.7°F | Ht 69.0 in | Wt 202.2 lb

## 2012-03-02 DIAGNOSIS — I10 Essential (primary) hypertension: Secondary | ICD-10-CM

## 2012-03-02 DIAGNOSIS — E119 Type 2 diabetes mellitus without complications: Secondary | ICD-10-CM

## 2012-03-02 DIAGNOSIS — E785 Hyperlipidemia, unspecified: Secondary | ICD-10-CM

## 2012-03-02 DIAGNOSIS — Z23 Encounter for immunization: Secondary | ICD-10-CM

## 2012-03-02 DIAGNOSIS — D689 Coagulation defect, unspecified: Secondary | ICD-10-CM

## 2012-03-02 LAB — HM DIABETES FOOT EXAM: HM Diabetic Foot Exam: NORMAL

## 2012-03-02 NOTE — Patient Instructions (Addendum)
Return tomorrow for fasting labs including protime  Return in 6 months for office visit

## 2012-03-02 NOTE — Progress Notes (Signed)
Patient ID: Stephen Kelly, male   DOB: 02/25/1941, 71 y.o.   MRN: [id number]   Patient Active Problem List  Diagnosis  . Clotting disorder  . History of renal calculi  . Gout, arthritis  . Pulmonary nodule, right  . Screening for colon cancer  . Sleep apnea, obstructive  . Hypertension  . Diabetes mellitus type 2, controlled  . Radial artery thrombosis, right  . Actinic keratosis of left side of forehead  . Patent foramen ovale  . Lupus anticoagulant inhibitor syndrome  . Other and unspecified hyperlipidemia    Subjective:  CC:   Chief Complaint  Patient presents with  . Follow-up    HPI:   Stephen Kelly a 71 y.o. male who presents  Past Medical History  Diagnosis Date  . Hypertension   . History of renal calculi 2001    history of lithotripsy ,  no stent  . Gout, arthritis     managed with allopurinol  . Pulmonary nodule, right 2011    unchanged, followed by gitten with serial ct  . Patent foramen ovale   . Lupus anticoagulant inhibitor syndrome     per gitten's test March 2012  . Clotting disorder 2011    Lupus Inhibitor positive by March 2012 studies    Past Surgical History  Procedure Date  . Thrombectomy     left brachicephalic artery         The following portions of the patient's history were reviewed and updated as appropriate: Allergies, current medications, and problem list.    Review of Systems:   12 Pt  review of systems was negative except those addressed in the HPI,     History   Social History  . Marital Status: Married    Spouse Name: N/A    Number of Children: N/A  . Years of Education: N/A   Occupational History  . Not on file.   Social History Main Topics  . Smoking status: Never Smoker   . Smokeless tobacco: Never Used  . Alcohol Use: No  . Drug Use: No  . Sexually Active: Not on file   Other Topics Concern  . Not on file   Social History Narrative  . No narrative on file    Objective:  BP  118/66  Pulse 69  Temp 97.7 F (36.5 C) (Oral)  Ht 5\' 9"  (1.753 m)  Wt 202 lb 4 oz (91.74 kg)  BMI 29.87 kg/m2  SpO2 96%  General appearance: alert, cooperative and appears stated age Ears: normal TM's and external ear canals both ears Throat: lips, mucosa, and tongue normal; teeth and gums normal Neck: no adenopathy, no carotid bruit, supple, symmetrical, trachea midline and thyroid not enlarged, symmetric, no tenderness/mass/nodules Back: symmetric, no curvature. ROM normal. No CVA tenderness. Lungs: clear to auscultation bilaterally Heart: regular rate and rhythm, S1, S2 normal, no murmur, click, rub or gallop Abdomen: soft, non-tender; bowel sounds normal; no masses,  no organomegaly Pulses: 2+ and symmetric Skin: Skin color, texture, turgor normal. No rashes or lesions Lymph nodes: Cervical, supraclavicular, and axillary nodes normal. Foot exam:  Nails are well trimmed,  No callouses,  Sensation intact to microfilament   Assessment and Plan:  Hypertension Well controlled on current regimen. Renal function stable, no changes today.  Clotting disorder Secondary to Lupus Anticoagulant inhibitor syndrome. 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  asmptomatic.   Other and  unspecified hyperlipidemia LDL 84 and triglycerides  161 at time of diabetes diagnosis in Dec 2012. Repeat lipids due.   Diabetes mellitus type 2, controlled Managed with low glycemic index diet and regular exercise.  Repeat A1c is due. He is up to date on eye exams  .  Foot exam is normal   Updated Medication List Outpatient Encounter Prescriptions as of 03/02/2012  Medication Sig Dispense Refill  . allopurinol (ZYLOPRIM) 100 MG tablet Take 1 tablet (100 mg total) by mouth daily.  90 tablet  3  . calcium carbonate (OS-CAL) 600 MG TABS Take 600 mg by mouth 2 (two) times daily with a meal.        . fish oil-omega-3  fatty acids 1000 MG capsule Take 2 g by mouth daily.        [name] lisinopril-hydrochlorothiazide (PRINZIDE,ZESTORETIC) 10-12.5 MG per tablet Take 1 tablet by mouth daily.  90 tablet  3  . Multiple Vitamin (MULTIVITAMIN) tablet Take 1 tablet by mouth daily.        [name] warfarin (COUMADIN) 2.5 MG tablet Take 1 tablet (2.5 mg total) by mouth daily.  90 tablet  3

## 2012-03-03 ENCOUNTER — Encounter: Payer: Self-pay | Admitting: Internal Medicine

## 2012-03-03 DIAGNOSIS — E785 Hyperlipidemia, unspecified: Secondary | ICD-10-CM | POA: Insufficient documentation

## 2012-03-03 DIAGNOSIS — E1169 Type 2 diabetes mellitus with other specified complication: Secondary | ICD-10-CM | POA: Insufficient documentation

## 2012-03-03 NOTE — Assessment & Plan Note (Signed)
Well controlled on current regimen. Renal function stable, no changes today. 

## 2012-03-03 NOTE — Assessment & Plan Note (Signed)
LDL 84 and triglycerides  161 at time of diabetes diagnosis in Dec 2012. Repeat lipids due.

## 2012-03-03 NOTE — Assessment & Plan Note (Addendum)
Managed with low glycemic index diet and regular exercise.  Repeat A1c is due. He is up to date on eye exams  .  Foot exam is normal

## 2012-03-03 NOTE — Assessment & Plan Note (Addendum)
Secondary to Lupus Anticoagulant inhibitor syndrome. 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  asmptomatic.

## 2012-03-04 ENCOUNTER — Other Ambulatory Visit (INDEPENDENT_AMBULATORY_CARE_PROVIDER_SITE_OTHER): Payer: Medicare Other

## 2012-03-04 DIAGNOSIS — Z79899 Other long term (current) drug therapy: Secondary | ICD-10-CM

## 2012-03-04 DIAGNOSIS — E119 Type 2 diabetes mellitus without complications: Secondary | ICD-10-CM

## 2012-03-04 DIAGNOSIS — Z7901 Long term (current) use of anticoagulants: Secondary | ICD-10-CM

## 2012-03-04 LAB — COMPREHENSIVE METABOLIC PANEL
ALT: 21 U/L (ref 0–53)
AST: 20 U/L (ref 0–37)
Albumin: 3.7 g/dL (ref 3.5–5.2)
Alkaline Phosphatase: 35 U/L — ABNORMAL LOW (ref 39–117)
BUN: 26 mg/dL — ABNORMAL HIGH (ref 6–23)
CO2: 28 mEq/L (ref 19–32)
Calcium: 9 mg/dL (ref 8.4–10.5)
Chloride: 103 mEq/L (ref 96–112)
Creatinine, Ser: 1.4 mg/dL (ref 0.4–1.5)
GFR: 51.87 mL/min — ABNORMAL LOW (ref >60.00)
Glucose, Bld: 137 mg/dL — ABNORMAL HIGH (ref 70–99)
Potassium: 4.2 mEq/L (ref 3.5–5.1)
Sodium: 138 mEq/L (ref 135–145)
Total Bilirubin: 1.1 mg/dL (ref 0.3–1.2)
Total Protein: 7 g/dL (ref 6.0–8.3)

## 2012-03-04 LAB — LIPID PANEL
Cholesterol: 157 mg/dL (ref 0–200)
HDL: 29.8 mg/dL — ABNORMAL LOW (ref >39.00)
Total CHOL/HDL Ratio: 5
Triglycerides: 225 mg/dL — ABNORMAL HIGH (ref 0.0–149.0)
VLDL: 45 mg/dL — ABNORMAL HIGH (ref 0.0–40.0)

## 2012-03-04 LAB — HEMOGLOBIN A1C: Hgb A1c MFr Bld: 6.3 % (ref 4.6–6.5)

## 2012-03-04 LAB — PROTIME-INR
INR: 2.1 ratio — ABNORMAL HIGH (ref 0.8–1.0)
Prothrombin Time: 22.2 s — ABNORMAL HIGH (ref 10.2–12.4)

## 2012-03-04 LAB — MICROALBUMIN / CREATININE URINE RATIO
Creatinine,U: 161.6 mg/dL
Microalb Creat Ratio: 0.9 mg/g (ref 0.0–30.0)
Microalb, Ur: 1.4 mg/dL (ref 0.0–1.9)

## 2012-03-04 LAB — LDL CHOLESTEROL, DIRECT: Direct LDL: 91.6 mg/dL

## 2012-03-05 LAB — HM DIABETES EYE EXAM: HM Diabetic Eye Exam: NORMAL

## 2012-03-08 ENCOUNTER — Encounter: Payer: Self-pay | Admitting: *Deleted

## 2012-03-22 ENCOUNTER — Telehealth: Payer: Self-pay | Admitting: Internal Medicine

## 2012-03-22 MED ORDER — ZOSTER VACCINE LIVE 19400 UNT/0.65ML ~~LOC~~ SOLR: 0.6500 mL | Freq: Once | SUBCUTANEOUS | Status: DC

## 2012-03-22 NOTE — Telephone Encounter (Signed)
Rx for Zostavax sent to [facility] electronically.

## 2012-03-22 NOTE — Telephone Encounter (Signed)
Pt is needing rx for Shingles shot jrt

## 2012-03-22 NOTE — Telephone Encounter (Signed)
On printer

## 2012-06-14 ENCOUNTER — Telehealth: Payer: Self-pay | Admitting: Internal Medicine

## 2012-06-14 MED ORDER — GUAIFENESIN-CODEINE 100-10 MG/5ML PO SYRP: 5.0000 mL | ORAL_SOLUTION | Freq: Three times a day (TID) | ORAL | Status: DC | PRN

## 2012-06-14 NOTE — Telephone Encounter (Signed)
Ok to call in ,  authorizied in epic

## 2012-06-14 NOTE — Telephone Encounter (Signed)
Pt called  Stating he has cold and wanted to know if dr Darrick Huntsman would call him in a rx for cough med.  Dr Artis Flock prescribed him cheratussin hc cough sryup  Take 2 teaspoon by mouth as needed [facility]

## 2012-06-14 NOTE — Telephone Encounter (Signed)
Called prescription in to pharmacy 

## 2012-09-02 ENCOUNTER — Encounter: Payer: Self-pay | Admitting: Internal Medicine

## 2012-09-02 ENCOUNTER — Ambulatory Visit (INDEPENDENT_AMBULATORY_CARE_PROVIDER_SITE_OTHER): Payer: Medicare Other | Admitting: Internal Medicine

## 2012-09-02 ENCOUNTER — Telehealth: Payer: Self-pay | Admitting: Internal Medicine

## 2012-09-02 VITALS — BP 110/68 | HR 80 | Temp 98.1°F | Resp 12 | Wt 203.0 lb

## 2012-09-02 DIAGNOSIS — E1059 Type 1 diabetes mellitus with other circulatory complications: Secondary | ICD-10-CM

## 2012-09-02 DIAGNOSIS — Q211 Atrial septal defect: Secondary | ICD-10-CM

## 2012-09-02 DIAGNOSIS — Z7901 Long term (current) use of anticoagulants: Secondary | ICD-10-CM

## 2012-09-02 DIAGNOSIS — D6862 Lupus anticoagulant syndrome: Secondary | ICD-10-CM

## 2012-09-02 DIAGNOSIS — D6859 Other primary thrombophilia: Secondary | ICD-10-CM

## 2012-09-02 DIAGNOSIS — R911 Solitary pulmonary nodule: Secondary | ICD-10-CM

## 2012-09-02 DIAGNOSIS — I10 Essential (primary) hypertension: Secondary | ICD-10-CM

## 2012-09-02 DIAGNOSIS — R05 Cough: Secondary | ICD-10-CM

## 2012-09-02 DIAGNOSIS — E119 Type 2 diabetes mellitus without complications: Secondary | ICD-10-CM

## 2012-09-02 DIAGNOSIS — I742 Embolism and thrombosis of arteries of the upper extremities: Secondary | ICD-10-CM

## 2012-09-02 DIAGNOSIS — M109 Gout, unspecified: Secondary | ICD-10-CM

## 2012-09-02 DIAGNOSIS — R059 Cough, unspecified: Secondary | ICD-10-CM

## 2012-09-02 DIAGNOSIS — Q2111 Secundum atrial septal defect: Secondary | ICD-10-CM

## 2012-09-02 DIAGNOSIS — E785 Hyperlipidemia, unspecified: Secondary | ICD-10-CM

## 2012-09-02 DIAGNOSIS — G4733 Obstructive sleep apnea (adult) (pediatric): Secondary | ICD-10-CM

## 2012-09-02 DIAGNOSIS — Q2112 Patent foramen ovale: Secondary | ICD-10-CM

## 2012-09-02 LAB — COMPREHENSIVE METABOLIC PANEL
ALT: 24 U/L (ref 0–53)
AST: 23 U/L (ref 0–37)
Albumin: 4.1 g/dL (ref 3.5–5.2)
Alkaline Phosphatase: 35 U/L — ABNORMAL LOW (ref 39–117)
BUN: 28 mg/dL — ABNORMAL HIGH (ref 6–23)
CO2: 26 mEq/L (ref 19–32)
Calcium: 9.1 mg/dL (ref 8.4–10.5)
Chloride: 101 mEq/L (ref 96–112)
Creatinine, Ser: 1.4 mg/dL (ref 0.4–1.5)
GFR: 53.08 mL/min — ABNORMAL LOW (ref >60.00)
Glucose, Bld: 146 mg/dL — ABNORMAL HIGH (ref 70–99)
Potassium: 4.1 mEq/L (ref 3.5–5.1)
Sodium: 135 mEq/L (ref 135–145)
Total Bilirubin: 0.5 mg/dL (ref 0.3–1.2)
Total Protein: 7.2 g/dL (ref 6.0–8.3)

## 2012-09-02 LAB — PROTIME-INR
INR: 2.7 ratio — ABNORMAL HIGH (ref 0.8–1.0)
Prothrombin Time: 28.5 s — ABNORMAL HIGH (ref 10.2–12.4)

## 2012-09-02 LAB — MICROALBUMIN / CREATININE URINE RATIO
Creatinine,U: 129.9 mg/dL
Microalb Creat Ratio: 1.2 mg/g (ref 0.0–30.0)
Microalb, Ur: 1.6 mg/dL (ref 0.0–1.9)

## 2012-09-02 LAB — HM DIABETES EYE EXAM: HM Diabetic Eye Exam: NORMAL

## 2012-09-02 LAB — HEMOGLOBIN A1C: Hgb A1c MFr Bld: 6.5 % (ref 4.6–6.5)

## 2012-09-02 LAB — LDL CHOLESTEROL, DIRECT: Direct LDL: 77.8 mg/dL

## 2012-09-02 MED ORDER — LOSARTAN POTASSIUM 50 MG PO TABS: 50.0000 mg | ORAL_TABLET | Freq: Every day | ORAL | Status: DC

## 2012-09-02 NOTE — Patient Instructions (Addendum)
Return in 6 months for your medicare wellness exam  Fasting labs the same day!    Changing your blood pressure medication to losartan in July to see if the cough goes away when we stop the lisinopril

## 2012-09-02 NOTE — Telephone Encounter (Signed)
Please advise if any results come in.

## 2012-09-02 NOTE — Progress Notes (Signed)
Patient ID: Stephen Kelly, male   DOB: 1940/07/18, 72 y.o.   MRN: [id number]   Patient Active Problem List  Diagnosis  . Clotting disorder  . History of renal calculi  . Gout, arthritis  . Pulmonary nodule, right  . Screening for colon cancer  . Sleep apnea, obstructive  . Hypertension  . Diabetes mellitus type 2, controlled  . Radial artery thrombosis, right  . Actinic keratosis of left side of forehead  . Patent foramen ovale  . Lupus anticoagulant inhibitor syndrome  . Other and unspecified hyperlipidemia    Subjective:  CC:   Chief Complaint  Patient presents with  . Follow-up    6 months    HPI:   Stephen Hamed Payneis a 72 y.o. male who presents for Six-month followup on diet-controlled diabetes, hyper tension, and gout. He has had no gout episodes since last visit. He takes allopurinol daily with good tolerance. He has noticed a dry cough which occurs several times daily but does not wake him from sleep. He denies any sinus symptoms or symptoms of allergic rhinitis. His weight is stable. He is nonsmoker. He does take lisinopril. He will he is physically active but does not exercise during the winter months. He has gained several pounds. He denies any numbness or tingling in his legs and has no claudication symptoms. He is frustrated by the continued living arrangement which involves his 19 year old grandson living with him and his wife for the past 4 years. His grandson is unemployed and has multiple psychiatric diagnoses which prevent him from maintaining gainful appointment and living independently.       Past Medical History  Diagnosis Date  . Hypertension   . History of renal calculi 2001    history of lithotripsy ,  no stent  . Gout, arthritis     managed with allopurinol  . Pulmonary nodule, right 2011    unchanged, followed by gitten with serial ct  . Patent foramen ovale   . Lupus anticoagulant inhibitor syndrome     per gitten's test March 2012  .  Clotting disorder 2011    Lupus Inhibitor positive by March 2012 studies    Past Surgical History  Procedure Laterality Date  . Thrombectomy      left brachicephalic artery     The following portions of the patient's history were reviewed and updated as appropriate: Allergies, current medications, and problem list.    Review of Systems:   Patient denies headache, fevers, malaise, unintentional weight loss, skin rash, eye pain, sinus congestion and sinus pain, sore throat, dysphagia,  hemoptysis , cough, dyspnea, wheezing, chest pain, palpitations, orthopnea, edema, abdominal pain, nausea, melena, diarrhea, constipation, flank pain, dysuria, hematuria, urinary  Frequency, nocturia, numbness, tingling, seizures,  Focal weakness, Loss of consciousness,  Tremor, insomnia, depression, anxiety, and suicidal ideation.        History   Social History  . Marital Status: Married    Spouse Name: N/A    Number of Children: N/A  . Years of Education: N/A   Occupational History  . Not on file.   Social History Main Topics  . Smoking status: Never Smoker   . Smokeless tobacco: Never Used  . Alcohol Use: No  . Drug Use: No  . Sexually Active: Not on file   Other Topics Concern  . Not on file   Social History Narrative  . No narrative on file    Objective:  BP 110/68  Pulse 80  Temp(Src) 98.1  F (36.7 C) (Oral)  Resp 12  Wt 203 lb (92.08 kg)  BMI 29.96 kg/m2  SpO2 95%  General appearance: alert, cooperative and appears stated age Ears: normal TM's and external ear canals both ears Throat: lips, mucosa, and tongue normal; teeth and gums normal Neck: no adenopathy, no carotid bruit, supple, symmetrical, trachea midline and thyroid not enlarged, symmetric, no tenderness/mass/nodules Back: symmetric, no curvature. ROM normal. No CVA tenderness. Lungs: clear to auscultation bilaterally Heart: regular rate and rhythm, S1, S2 normal, no murmur, click, rub or gallop Abdomen:  soft, non-tender; bowel sounds normal; no masses,  no organomegaly Pulses: 2+ and symmetric Skin: Skin color, texture, turgor normal. No rashes or lesions Lymph nodes: Cervical, supraclavicular, and axillary nodes normal. Foot exam:  Nails are well trimmed,  No callouses,  Sensation intact to microfilament  Assessment and Plan:  Hypertension Changing meds to losartan due to cough starting in July.  Sleep apnea, obstructive He maintains good sleep habits with no morning fatigue or hypersomnolence. He has no interest in repeating a sleep study given his lack of symptoms.  Radial artery thrombosis, right  continue Coumadin, INR is therapeutic today at 2.7  He is inquiring about whether he is a suitable candidate for the Coumadin alternatives. Because of his history of lupus anticoagulator he is not a candidate for the other anticoagulants but may discuss this with Dr. 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  He is not a candidate for Eliquis or Pradaxa due to his history.   Gout, arthritis Quiescent since allopurinol was started.  Diabetes mellitus type 2, controlled Excellent control, hemoglobin A1c 6.6. No signs of proteinuria by today's urinalysis. No neuropathy by foot exam today. LDL is at goal. Her mind or for annual diabetic eye exam given. Foot exam done today.  Cough Ideology may clear. May be due to the ACE inhibitor versus pulmonary nodules, no, versus reflux. We discussed stopping his lisinopril and changing to losartan for a trial to meds. He has medications to last until July and does not want to switch before then. He is up-to-date on followup on pulmonary nodules.  Pulmonary nodule, right His last chest CT was in 2012 at which time he nodules seen were care (stable calcified granulomas). He has not had followup  since then. If his cough does not improve with change in blood pressure medications he will need to undergo repeat pulmonary evaluation.  A total of 40 minutes was spent with patient more than half of which was spent in counseling, reviewing records from other prviders and coordination of care.  Updated Medication List Outpatient Encounter Prescriptions as of 09/02/2012  Medication Sig Dispense Refill  . allopurinol (ZYLOPRIM) 100 MG tablet Take 1 tablet (100 mg total) by mouth daily.  90 tablet  3  . warfarin (COUMADIN) 2.5 MG tablet Take 1 tablet (2.5 mg total) by mouth daily.  90 tablet  3  . [DISCONTINUED] lisinopril-hydrochlorothiazide (PRINZIDE,ZESTORETIC) 10-12.5 MG per tablet Take 1 tablet by mouth daily.  90 tablet  3  . calcium carbonate (OS-CAL) 600 MG TABS Take 600 mg by mouth 2 (two) times daily with a meal.        . fish oil-omega-3 fatty acids 1000 MG capsule Take 2 g by mouth daily.        [name] guaiFENesin-codeine (CHERATUSSIN AC) 100-10 MG/5ML syrup  Take 5 mLs by mouth 3 (three) times daily as needed for cough.  180 mL  0  . losartan (COZAAR) 50 MG tablet Take 1 tablet (50 mg total) by mouth daily.  90 tablet  3  . Multiple Vitamin (MULTIVITAMIN) tablet Take 1 tablet by mouth daily.        [name] zoster vaccine live, PF, (ZOSTAVAX) 16109 UNT/0.65ML injection Inject 19,400 Units into the skin once.  1 vial  0   No facility-administered encounter medications on file as of 09/02/2012.

## 2012-09-02 NOTE — Telephone Encounter (Signed)
Pt call pt when you get lab results

## 2012-09-02 NOTE — Assessment & Plan Note (Addendum)
Changing meds to losartan due to cough starting in July.

## 2012-09-03 ENCOUNTER — Encounter: Payer: Self-pay | Admitting: Internal Medicine

## 2012-09-03 DIAGNOSIS — R05 Cough: Secondary | ICD-10-CM | POA: Insufficient documentation

## 2012-09-03 DIAGNOSIS — R059 Cough, unspecified: Secondary | ICD-10-CM | POA: Insufficient documentation

## 2012-09-03 LAB — HM DIABETES FOOT EXAM: HM Diabetic Foot Exam: NORMAL

## 2012-09-03 NOTE — Assessment & Plan Note (Signed)
His last chest CT was in 2012 at which time he nodules seen were care (stable calcified granulomas). He has not had followup since then. If his cough does not improve with change in blood pressure medications he will need to undergo repeat pulmonary evaluation.

## 2012-09-03 NOTE — Assessment & Plan Note (Signed)
Ideology may clear. May be due to the ACE inhibitor versus pulmonary nodules, no, versus reflux. We discussed stopping his lisinopril and changing to losartan for a trial to meds. He has medications to last until July and does not want to switch before then. He is up-to-date on followup on pulmonary nodules.

## 2012-09-03 NOTE — Assessment & Plan Note (Signed)
Excellent control, hemoglobin A1c 6.6. No signs of proteinuria by today's urinalysis. No neuropathy by foot exam today. LDL is at goal. Her mind or for annual diabetic eye exam given. Foot exam done today.

## 2012-09-03 NOTE — Assessment & Plan Note (Signed)
Quiescent since allopurinol was started.

## 2012-09-03 NOTE — Assessment & Plan Note (Addendum)
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  He is not a candidate for Eliquis or Pradaxa due to his history.  

## 2012-09-03 NOTE — Assessment & Plan Note (Signed)
He maintains good sleep habits with no morning fatigue or hypersomnolence. He has no interest in repeating a sleep study given his lack of symptoms.

## 2012-09-03 NOTE — Assessment & Plan Note (Signed)
continue Coumadin, INR due today. He is inquiring about whether he is a suitable candidate for the Coumadin alternatives.

## 2012-09-08 ENCOUNTER — Ambulatory Visit: Payer: Medicare Other

## 2013-01-04 ENCOUNTER — Other Ambulatory Visit: Payer: Self-pay | Admitting: Internal Medicine

## 2013-01-05 ENCOUNTER — Other Ambulatory Visit (INDEPENDENT_AMBULATORY_CARE_PROVIDER_SITE_OTHER): Payer: Medicare Other

## 2013-01-05 DIAGNOSIS — Z7901 Long term (current) use of anticoagulants: Secondary | ICD-10-CM

## 2013-01-05 NOTE — Addendum Note (Signed)
Addended by: Montine Circle D on: 01/05/2013 11:23 AM   Modules accepted: Orders

## 2013-01-06 LAB — PROTIME-INR
INR: 4.15 — ABNORMAL HIGH (ref <1.50)
Prothrombin Time: 37.9 seconds — ABNORMAL HIGH (ref 11.6–15.2)

## 2013-01-10 MED ORDER — WARFARIN SODIUM 2 MG PO TABS: 2.0000 mg | ORAL_TABLET | Freq: Every day | ORAL | Status: DC

## 2013-01-10 NOTE — Addendum Note (Signed)
Addended by: Sherlene Shams on: 01/10/2013 06:52 AM   Modules accepted: Orders

## 2013-01-18 ENCOUNTER — Other Ambulatory Visit (INDEPENDENT_AMBULATORY_CARE_PROVIDER_SITE_OTHER): Payer: Medicare Other

## 2013-01-18 ENCOUNTER — Other Ambulatory Visit: Payer: Self-pay | Admitting: *Deleted

## 2013-01-18 DIAGNOSIS — Z7901 Long term (current) use of anticoagulants: Secondary | ICD-10-CM

## 2013-01-18 LAB — PROTIME-INR
INR: 2 ratio — ABNORMAL HIGH (ref 0.8–1.0)
Prothrombin Time: 21.1 s — ABNORMAL HIGH (ref 10.2–12.4)

## 2013-01-20 ENCOUNTER — Other Ambulatory Visit: Payer: Self-pay | Admitting: *Deleted

## 2013-02-05 ENCOUNTER — Other Ambulatory Visit: Payer: Self-pay | Admitting: Internal Medicine

## 2013-02-07 ENCOUNTER — Other Ambulatory Visit: Payer: Self-pay | Admitting: Internal Medicine

## 2013-02-07 NOTE — Telephone Encounter (Signed)
Lisinopril was stopped at 4/14 visit due to cough, changed to Losartan

## 2013-02-20 ENCOUNTER — Other Ambulatory Visit (INDEPENDENT_AMBULATORY_CARE_PROVIDER_SITE_OTHER): Payer: Medicare Other

## 2013-02-20 ENCOUNTER — Ambulatory Visit (INDEPENDENT_AMBULATORY_CARE_PROVIDER_SITE_OTHER): Payer: Medicare Other

## 2013-02-20 DIAGNOSIS — Z23 Encounter for immunization: Secondary | ICD-10-CM

## 2013-02-20 DIAGNOSIS — Z7901 Long term (current) use of anticoagulants: Secondary | ICD-10-CM

## 2013-02-20 LAB — PROTIME-INR
INR: 2.5 ratio — ABNORMAL HIGH (ref 0.8–1.0)
Prothrombin Time: 26.3 s — ABNORMAL HIGH (ref 10.2–12.4)

## 2013-02-27 LAB — HM DIABETES EYE EXAM: HM Diabetic Eye Exam: NORMAL

## 2013-03-08 ENCOUNTER — Encounter: Payer: Self-pay | Admitting: Internal Medicine

## 2013-03-08 ENCOUNTER — Encounter (INDEPENDENT_AMBULATORY_CARE_PROVIDER_SITE_OTHER): Payer: Self-pay

## 2013-03-08 ENCOUNTER — Ambulatory Visit (INDEPENDENT_AMBULATORY_CARE_PROVIDER_SITE_OTHER): Payer: Medicare Other | Admitting: Internal Medicine

## 2013-03-08 VITALS — BP 148/88 | HR 82 | Temp 98.2°F | Resp 14 | Ht 68.25 in | Wt 211.0 lb

## 2013-03-08 DIAGNOSIS — M109 Gout, unspecified: Secondary | ICD-10-CM

## 2013-03-08 DIAGNOSIS — E119 Type 2 diabetes mellitus without complications: Secondary | ICD-10-CM

## 2013-03-08 DIAGNOSIS — Z1211 Encounter for screening for malignant neoplasm of colon: Secondary | ICD-10-CM

## 2013-03-08 DIAGNOSIS — Z0000 Encounter for general adult medical examination without abnormal findings: Secondary | ICD-10-CM

## 2013-03-08 DIAGNOSIS — R5381 Other malaise: Secondary | ICD-10-CM

## 2013-03-08 DIAGNOSIS — E785 Hyperlipidemia, unspecified: Secondary | ICD-10-CM

## 2013-03-08 DIAGNOSIS — Z7901 Long term (current) use of anticoagulants: Secondary | ICD-10-CM

## 2013-03-08 DIAGNOSIS — Z125 Encounter for screening for malignant neoplasm of prostate: Secondary | ICD-10-CM

## 2013-03-08 LAB — COMPREHENSIVE METABOLIC PANEL
ALT: 20 U/L (ref 0–53)
AST: 18 U/L (ref 0–37)
Albumin: 3.7 g/dL (ref 3.5–5.2)
Alkaline Phosphatase: 51 U/L (ref 39–117)
BUN: 24 mg/dL — ABNORMAL HIGH (ref 6–23)
CO2: 29 mEq/L (ref 19–32)
Calcium: 9.2 mg/dL (ref 8.4–10.5)
Chloride: 103 mEq/L (ref 96–112)
Creatinine, Ser: 1.2 mg/dL (ref 0.4–1.5)
GFR: 63.33 mL/min (ref >60.00)
Glucose, Bld: 176 mg/dL — ABNORMAL HIGH (ref 70–99)
Potassium: 4.3 mEq/L (ref 3.5–5.1)
Sodium: 139 mEq/L (ref 135–145)
Total Bilirubin: 0.5 mg/dL (ref 0.3–1.2)
Total Protein: 6.9 g/dL (ref 6.0–8.3)

## 2013-03-08 LAB — CBC WITH DIFFERENTIAL/PLATELET
Basophils Absolute: 0 10*3/uL (ref 0.0–0.1)
Basophils Relative: 0.6 % (ref 0.0–3.0)
Eosinophils Absolute: 0.3 10*3/uL (ref 0.0–0.7)
Eosinophils Relative: 3.6 % (ref 0.0–5.0)
HCT: 41.5 % (ref 39.0–52.0)
Hemoglobin: 14.3 g/dL (ref 13.0–17.0)
Lymphocytes Relative: 30 % (ref 12.0–46.0)
Lymphs Abs: 2.2 10*3/uL (ref 0.7–4.0)
MCHC: 34.3 g/dL (ref 30.0–36.0)
MCV: 85.9 fl (ref 78.0–100.0)
Monocytes Absolute: 0.5 10*3/uL (ref 0.1–1.0)
Monocytes Relative: 6.3 % (ref 3.0–12.0)
Neutro Abs: 4.3 10*3/uL (ref 1.4–7.7)
Neutrophils Relative %: 59.5 % (ref 43.0–77.0)
Platelets: 273 10*3/uL (ref 150.0–400.0)
RBC: 4.83 Mil/uL (ref 4.22–5.81)
RDW: 14.1 % (ref 11.5–14.6)
WBC: 7.2 10*3/uL (ref 4.5–10.5)

## 2013-03-08 LAB — LDL CHOLESTEROL, DIRECT: Direct LDL: 87.4 mg/dL

## 2013-03-08 LAB — LIPID PANEL
Cholesterol: 175 mg/dL (ref 0–200)
HDL: 30.3 mg/dL — ABNORMAL LOW (ref >39.00)
Total CHOL/HDL Ratio: 6
Triglycerides: 348 mg/dL — ABNORMAL HIGH (ref 0.0–149.0)
VLDL: 69.6 mg/dL — ABNORMAL HIGH (ref 0.0–40.0)

## 2013-03-08 LAB — URIC ACID: Uric Acid, Serum: 5.9 mg/dL (ref 4.0–7.8)

## 2013-03-08 LAB — PSA, MEDICARE: PSA: 1.25 ng/ml (ref 0.10–4.00)

## 2013-03-08 LAB — PROTIME-INR
INR: 2.3 ratio — ABNORMAL HIGH (ref 0.8–1.0)
Prothrombin Time: 23.8 s — ABNORMAL HIGH (ref 10.2–12.4)

## 2013-03-08 LAB — MICROALBUMIN / CREATININE URINE RATIO
Creatinine,U: 182.4 mg/dL
Microalb Creat Ratio: 9.8 mg/g (ref 0.0–30.0)
Microalb, Ur: 17.9 mg/dL — ABNORMAL HIGH (ref 0.0–1.9)

## 2013-03-08 LAB — HEMOGLOBIN A1C: Hgb A1c MFr Bld: 7.3 % — ABNORMAL HIGH (ref 4.6–6.5)

## 2013-03-08 NOTE — Patient Instructions (Signed)
Ask your insurance agent about the [facility] Advantage plan  I checked your stool for blood today when i checked your prostate and it was normal   You had your annual Medicare wellness exam today  Please use the stool kit to send [place] back a sample to test for colon CA     We will contact you with the bloodwork results

## 2013-03-08 NOTE — Progress Notes (Signed)
Patient ID: Stephen Kelly, male   DOB: [birth year], 72 y.o.   MRN: [id number]  Annual exam and follow up on chronic conditions including DNM, htn,  VTE disease,  Lipids.,  And obesity  The patient is here for annual Medicare wellness examination and management of other chronic and acute problems.   The risk factors are reflected in the social history.  The roster of all physicians providing medical care to patient - is listed in the Snapshot section of the chart.  Activities of daily living:  The patient is 100% independent in all ADLs: dressing, toileting, feeding as well as independent mobility  Home safety : The patient has smoke detectors in the home. They wear seatbelts.  There are no firearms at home. There is no violence in the home.   There is no risks for hepatitis, STDs or HIV. There is no   history of blood transfusion. They have no travel history to infectious disease endemic areas of the world.  The patient has seen their dentist in the last six month. They have seen their eye doctor in the last year. They admit to slight hearing difficulty with regard to whispered voices and some television programs.  They have deferred audiologic testing in the last year.  They do not  have excessive sun exposure. Discussed the need for sun protection: hats, long sleeves and use of sunscreen if there is significant sun exposure.   Diet: the importance of a healthy diet is discussed. They do have a healthy diet.  The benefits of regular aerobic exercise were discussed. She walks 4 times per week ,  20 minutes.   Mows 6 yards per week with a push mower, in the fall does yardwark.   Had a gout flare in right knee last week,  Mild with swelling and warmth . Uses aleve On allopurinol.   Depression screen: there are no signs or vegative symptoms of depression- irritability, change in appetite, anhedonia, sadness/tearfullness.  Cognitive assessment: the patient manages all their financial and personal  affairs and is actively engaged. They could relate day,date,year and events; recalled 2/3 objects at 3 minutes; performed clock-face test normally.  The following portions of the patient's history were reviewed and updated as appropriate: allergies, current medications, past family history, past medical history,  past surgical history, past social history  and problem list.  Visual acuity was not assessed per patient preference since she has regular follow up with her ophthalmologist. Hearing and body mass index were assessed and reviewed.   During the course of the visit the patient was educated and counseled about appropriate screening and preventive services including : fall prevention , diabetes screening, nutrition counseling, colorectal cancer screening, and recommended immunizations.    Objective:  BP 148/88  Pulse 82  Temp(Src) 98.2 F (36.8 C) (Oral)  Resp 14  Ht 5' 8.25" (1.734 m)  Wt 211 lb (95.709 kg)  BMI 31.83 kg/m2  SpO2 97%  General Appearance:    Alert, cooperative, no distress, appears stated age  Head:    Normocephalic, without obvious abnormality, atraumatic  Eyes:    PERRL, conjunctiva/corneas clear, EOM's intact, fundi    benign, both eyes       Ears:    Normal TM's and external ear canals, both ears  Nose:   Nares normal, septum midline, mucosa normal, no drainage   or sinus tenderness  Throat:   Lips, mucosa, and tongue normal; teeth and gums normal  Neck:   Supple, symmetrical,  trachea midline, no adenopathy;       thyroid:  No enlargement/tenderness/nodules; no carotid   bruit or JVD  Back:     Symmetric, no curvature, ROM normal, no CVA tenderness  Lungs:     Clear to auscultation bilaterally, respirations unlabored  Chest wall:    No tenderness or deformity  Heart:    Regular rate and rhythm, S1 and S2 normal, no murmur, rub   or gallop  Abdomen:     Soft, non-tender, bowel sounds active all four quadrants,    no masses, no organomegaly  Genitalia:     Normal male without lesion, discharge or tenderness  Rectal:    Normal tone, normal prostate, no masses or tenderness;   guaiac negative stool  Extremities:   Extremities normal, atraumatic, no cyanosis or edema  Pulses:   2+ and symmetric all extremities  Skin:   Skin color, texture, turgor normal, no rashes or lesions  Lymph nodes:   Cervical, supraclavicular, and axillary nodes normal  Neurologic:   CNII-XII intact. Normal strength, sensation and reflexes      Throughout    Assessment and Plan:  Other and unspecified hyperlipidemia Lipids were normal, except for triglycerides which rose from 225 to 348 .  Will allow patient to address with low GI diet for 6 weeksa and repeat with plans to treat if fenofibrate if > 200 at next check.   Controlled type 2 diabetes mellitus with microalbuminuria or microproteinuria Historically well-controlled on diet alone. hemoglobin A1c has been consistently less than 7.0  But now 7.3 . He is up-to-date on eye exams and his foot exam is normal.  New onset microalbuminura.   Continue cozaar. Advice abotu low GI diet and handouts given. Return in 3 months   Encounter for initial preventive physical examination covered by Medicare Annual male exam was done including testicular and prostate exam. PSA is normal.  Colon ca screening is up to date .   Updated Medication List Outpatient Encounter Prescriptions as of 03/08/2013  Medication Sig Dispense Refill  . allopurinol (ZYLOPRIM) 100 MG tablet TAKE 1 TABLET (100 MG TOTAL) BY MOUTH DAILY.  90 tablet  3  . losartan (COZAAR) 50 MG tablet Take 1 tablet (50 mg total) by mouth daily.  90 tablet  3  . omeprazole (PRILOSEC OTC) 20 MG tablet Take 20 mg by mouth daily.      [name] warfarin (COUMADIN) 2 MG tablet Take 1 tablet (2 mg total) by mouth daily.  30 tablet  3  . zoster vaccine live, PF, (ZOSTAVAX) 10272 UNT/0.65ML injection Inject 19,400 Units into the skin once.  1 vial  0  . [DISCONTINUED] calcium carbonate  (OS-CAL) 600 MG TABS Take 600 mg by mouth 2 (two) times daily with a meal.        . [DISCONTINUED] fish oil-omega-3 fatty acids 1000 MG capsule Take 2 g by mouth daily.        . [DISCONTINUED] guaiFENesin-codeine (CHERATUSSIN AC) 100-10 MG/5ML syrup Take 5 mLs by mouth 3 (three) times daily as needed for cough.  180 mL  0  . [DISCONTINUED] Multiple Vitamin (MULTIVITAMIN) tablet Take 1 tablet by mouth daily.         No facility-administered encounter medications on file as of 03/08/2013.

## 2013-03-09 ENCOUNTER — Encounter: Payer: Self-pay | Admitting: *Deleted

## 2013-03-09 DIAGNOSIS — Z0000 Encounter for general adult medical examination without abnormal findings: Secondary | ICD-10-CM | POA: Insufficient documentation

## 2013-03-09 LAB — HM DIABETES FOOT EXAM: HM Diabetic Foot Exam: NORMAL

## 2013-03-09 LAB — TSH: TSH: 1.97 u[IU]/mL (ref 0.35–5.50)

## 2013-03-09 NOTE — Assessment & Plan Note (Signed)
Lipids were normal, except for triglycerides which rose from 225 to 348 .  Will allow patient to address with low GI diet for 6 weeksa and repeat with plans to treat if fenofibrate if > 200 at next check.

## 2013-03-09 NOTE — Assessment & Plan Note (Addendum)
Historically well-controlled on diet alone. hemoglobin A1c has been consistently less than 7.0  But now 7.3 . He is up-to-date on eye exams and his foot exam is normal.  New onset microalbuminura.   Continue cozaar. Advice abotu low GI diet and handouts given. Return in 3 months

## 2013-03-09 NOTE — Assessment & Plan Note (Signed)
Annual male exam was done including testicular and prostate exam. PSA is normal.  Colon ca screening is up to date .

## 2013-03-09 NOTE — Assessment & Plan Note (Signed)
Continue allopurinol for gout prevention

## 2013-03-11 LAB — HM DIABETES EYE EXAM

## 2013-04-06 ENCOUNTER — Telehealth: Payer: Self-pay | Admitting: Internal Medicine

## 2013-04-06 NOTE — Telephone Encounter (Signed)
Patient reports having cough and symptoms of cold X 1 week cough keeping him up at night please advise as to refill?

## 2013-04-06 NOTE — Telephone Encounter (Signed)
He has a  a viral  Syndrome .  The post nasal drip may be  causing his cougH.   Lavage your sinuses twice daly with Simply saline nasal spray.  Use benadryl 25 mg every 8 hours and Sudafed PE 10 to 30 every 8 hours to manage the drainage and congestion.  Gargle with salt water often for the sore throat.  If theCOUGH is no better  In 3 to 4 days OR  if you develop T > 100.4,  Green nasal discharge,  Or facial pain,  NEED TO BE SEEN .  Delsym OTc EVERY 8 TO 12 HOURS FOR COUGH

## 2013-04-06 NOTE — Telephone Encounter (Signed)
Patient notified of instructions and verbalized understanding.

## 2013-04-06 NOTE — Telephone Encounter (Signed)
Pt came in today wanting to get a refill on cheratussin ac syrup Take 3 times a day a needed [facility] Pt didn't want to call drug store to get refill he stated it took longer that way Pt is out

## 2013-05-08 ENCOUNTER — Telehealth: Payer: Self-pay | Admitting: Internal Medicine

## 2013-05-08 MED ORDER — WARFARIN SODIUM 2 MG PO TABS: 2.0000 mg | ORAL_TABLET | Freq: Every day | ORAL | Status: DC

## 2013-05-08 NOTE — Telephone Encounter (Signed)
Warfarin 2 mg refill needed.  [facility]

## 2013-05-08 NOTE — Telephone Encounter (Signed)
Refill sent as requested. 

## 2013-06-07 ENCOUNTER — Telehealth: Payer: Self-pay | Admitting: *Deleted

## 2013-06-07 DIAGNOSIS — E119 Type 2 diabetes mellitus without complications: Secondary | ICD-10-CM

## 2013-06-07 DIAGNOSIS — E785 Hyperlipidemia, unspecified: Secondary | ICD-10-CM

## 2013-06-07 NOTE — Telephone Encounter (Signed)
Pt is coming in tomorrow 01.15.2015 what labs and dX?  

## 2013-06-08 ENCOUNTER — Telehealth: Payer: Self-pay | Admitting: *Deleted

## 2013-06-08 ENCOUNTER — Other Ambulatory Visit (INDEPENDENT_AMBULATORY_CARE_PROVIDER_SITE_OTHER): Payer: Medicare Other

## 2013-06-08 DIAGNOSIS — E119 Type 2 diabetes mellitus without complications: Secondary | ICD-10-CM

## 2013-06-08 DIAGNOSIS — Z7901 Long term (current) use of anticoagulants: Secondary | ICD-10-CM

## 2013-06-08 DIAGNOSIS — E785 Hyperlipidemia, unspecified: Secondary | ICD-10-CM

## 2013-06-08 LAB — LIPID PANEL
Cholesterol: 166 mg/dL (ref 0–200)
HDL: 28.3 mg/dL — ABNORMAL LOW (ref >39.00)
Total CHOL/HDL Ratio: 6
Triglycerides: 469 mg/dL — ABNORMAL HIGH (ref 0.0–149.0)
VLDL: 93.8 mg/dL — ABNORMAL HIGH (ref 0.0–40.0)

## 2013-06-08 LAB — COMPREHENSIVE METABOLIC PANEL
ALT: 19 U/L (ref 0–53)
AST: 17 U/L (ref 0–37)
Albumin: 3.8 g/dL (ref 3.5–5.2)
Alkaline Phosphatase: 55 U/L (ref 39–117)
BUN: 22 mg/dL (ref 6–23)
CO2: 27 mEq/L (ref 19–32)
Calcium: 9.2 mg/dL (ref 8.4–10.5)
Chloride: 104 mEq/L (ref 96–112)
Creatinine, Ser: 1.4 mg/dL (ref 0.4–1.5)
GFR: 51.69 mL/min — ABNORMAL LOW (ref >60.00)
Glucose, Bld: 187 mg/dL — ABNORMAL HIGH (ref 70–99)
Potassium: 4.2 mEq/L (ref 3.5–5.1)
Sodium: 138 mEq/L (ref 135–145)
Total Bilirubin: 0.6 mg/dL (ref 0.3–1.2)
Total Protein: 7 g/dL (ref 6.0–8.3)

## 2013-06-08 LAB — LDL CHOLESTEROL, DIRECT: Direct LDL: 78.5 mg/dL

## 2013-06-08 LAB — PROTIME-INR
INR: 1.7 ratio — ABNORMAL HIGH (ref 0.8–1.0)
Prothrombin Time: 17.4 s — ABNORMAL HIGH (ref 10.2–12.4)

## 2013-06-08 LAB — HEMOGLOBIN A1C: Hgb A1c MFr Bld: 7.1 % — ABNORMAL HIGH (ref 4.6–6.5)

## 2013-06-08 NOTE — Telephone Encounter (Signed)
Sure , can you make it a monthly repeating?

## 2013-06-08 NOTE — Telephone Encounter (Signed)
Pt wants to add the pt/inr?

## 2013-06-08 NOTE — Addendum Note (Signed)
Addended by: Montine CircleMALDONADO, Jovanne Riggenbach D on: 06/08/2013 11:55 AM   Modules accepted: Orders

## 2013-06-16 ENCOUNTER — Telehealth: Payer: Self-pay | Admitting: Internal Medicine

## 2013-06-16 MED ORDER — GEMFIBROZIL 600 MG PO TABS: 600.0000 mg | ORAL_TABLET | Freq: Two times a day (BID) | ORAL | Status: DC

## 2013-06-16 MED ORDER — WARFARIN SODIUM 2 MG PO TABS: ORAL_TABLET | ORAL | Status: DC

## 2013-06-16 NOTE — Telephone Encounter (Signed)
New coumadin dose 4 mg on Fridays and Tuesdays,  2 mg all other days repeat INR 2 week Adding gemfibrozil 600 mg twice daily for triglycerides.

## 2013-06-16 NOTE — Telephone Encounter (Signed)
Message copied by Sherlene ShamsULLO, TERESA L on Fri Jun 16, 2013  1:28 PM ------      Message from: Dennie BibleAVIS, KATHY R      Created: Tue Jun 13, 2013  3:49 PM       Patient stated he was fasting and that he is taking 2 mg of coumadin daily please advise. ------

## 2013-06-16 NOTE — Telephone Encounter (Signed)
[facility] requested read back and voiced understanding.

## 2013-06-19 ENCOUNTER — Telehealth: Payer: Self-pay | Admitting: Internal Medicine

## 2013-06-19 NOTE — Telephone Encounter (Signed)
Patient called in questing the reason why his triglycerides are high. Before he picks up this new medication that was told him on 06/16/13 he would like someone to call him back. This is the medication in question copied from telephone note on 06/16/13 gemfibrozil 600 mg twice daily for triglycerides.

## 2013-06-20 NOTE — Telephone Encounter (Signed)
Please explain to [name] that the gemfibrozil is to was prescribed to lower his cholesterol specifically his triglycerides. He was called by Lynden Angathy several weeks ago to confirm that he was fasting when he had his recent cholesterol panel done. I do not know why his triglycerides are high.   it could be his diet,  it could be familial, it could be a lab error.  If he wants to repeat the panel to be sure they are correct he can return for that. If they are indeed high on repeat, he needs to lower them either through diet and exercise or with medication. If he would like to try a low glycemic index diet and exercise and repeat the fasting lipids in  6 weeks that will be fine.  .[name]

## 2013-06-20 NOTE — Telephone Encounter (Signed)
When he was asked about fasting he thought it was for October labs.  Pt states that he did not know he had to be fasting on the 15th cause he thought they were checking his protime so he ate before lab appt.  He would like to know if Dr Darrick Huntsmanullo would still like to wait 6 wks to repeat lipid panel

## 2013-06-20 NOTE — Telephone Encounter (Signed)
Mystery solved.  He was not fasting!  We will repeat in 3 months

## 2013-06-21 NOTE — Telephone Encounter (Signed)
Pt verbalized understanding and had questions

## 2013-09-01 ENCOUNTER — Other Ambulatory Visit (INDEPENDENT_AMBULATORY_CARE_PROVIDER_SITE_OTHER): Payer: Medicare Other

## 2013-09-01 ENCOUNTER — Telehealth: Payer: Self-pay | Admitting: *Deleted

## 2013-09-01 DIAGNOSIS — R5383 Other fatigue: Secondary | ICD-10-CM

## 2013-09-01 DIAGNOSIS — R5381 Other malaise: Secondary | ICD-10-CM

## 2013-09-01 DIAGNOSIS — Z7901 Long term (current) use of anticoagulants: Secondary | ICD-10-CM

## 2013-09-01 DIAGNOSIS — Z79899 Other long term (current) drug therapy: Secondary | ICD-10-CM

## 2013-09-01 DIAGNOSIS — E785 Hyperlipidemia, unspecified: Secondary | ICD-10-CM

## 2013-09-01 DIAGNOSIS — I10 Essential (primary) hypertension: Secondary | ICD-10-CM

## 2013-09-01 LAB — COMPREHENSIVE METABOLIC PANEL
ALT: 19 U/L (ref 0–53)
AST: 17 U/L (ref 0–37)
Albumin: 3.6 g/dL (ref 3.5–5.2)
Alkaline Phosphatase: 50 U/L (ref 39–117)
BUN: 33 mg/dL — ABNORMAL HIGH (ref 6–23)
CO2: 25 mEq/L (ref 19–32)
Calcium: 9.4 mg/dL (ref 8.4–10.5)
Chloride: 106 mEq/L (ref 96–112)
Creatinine, Ser: 1.2 mg/dL (ref 0.4–1.5)
GFR: 63.85 mL/min (ref >60.00)
Glucose, Bld: 125 mg/dL — ABNORMAL HIGH (ref 70–99)
Potassium: 4.7 mEq/L (ref 3.5–5.1)
Sodium: 138 mEq/L (ref 135–145)
Total Bilirubin: 0.8 mg/dL (ref 0.3–1.2)
Total Protein: 6.7 g/dL (ref 6.0–8.3)

## 2013-09-01 LAB — LIPID PANEL
Cholesterol: 198 mg/dL (ref 0–200)
HDL: 36 mg/dL — ABNORMAL LOW (ref >39.00)
LDL Cholesterol: 139 mg/dL — ABNORMAL HIGH (ref 0–99)
Total CHOL/HDL Ratio: 6
Triglycerides: 115 mg/dL (ref 0.0–149.0)
VLDL: 23 mg/dL (ref 0.0–40.0)

## 2013-09-01 LAB — PROTIME-INR
INR: 2.5 ratio — ABNORMAL HIGH (ref 0.8–1.0)
Prothrombin Time: 25.8 s — ABNORMAL HIGH (ref 10.2–12.4)

## 2013-09-01 NOTE — Telephone Encounter (Signed)
What labs and dx?  

## 2013-09-01 NOTE — Telephone Encounter (Signed)
What dx?  

## 2013-09-01 NOTE — Telephone Encounter (Signed)
The labs should be in with diagnosis codes.  V58,61 v58.61 780.79  a1c willl be 250.00

## 2013-09-01 NOTE — Telephone Encounter (Signed)
He is early for his A1c by 5 days an d will have to return    just check CMET TSH, lipids, and iNR

## 2013-09-04 LAB — TSH: TSH: 1.79 u[IU]/mL (ref 0.35–5.50)

## 2013-09-06 ENCOUNTER — Encounter: Payer: Self-pay | Admitting: Internal Medicine

## 2013-09-06 ENCOUNTER — Ambulatory Visit (INDEPENDENT_AMBULATORY_CARE_PROVIDER_SITE_OTHER): Payer: Medicare Other | Admitting: Internal Medicine

## 2013-09-06 ENCOUNTER — Other Ambulatory Visit: Payer: Self-pay | Admitting: Internal Medicine

## 2013-09-06 VITALS — BP 144/78 | HR 84 | Temp 97.9°F | Resp 16 | Wt 210.5 lb

## 2013-09-06 DIAGNOSIS — E119 Type 2 diabetes mellitus without complications: Secondary | ICD-10-CM

## 2013-09-06 DIAGNOSIS — Z23 Encounter for immunization: Secondary | ICD-10-CM

## 2013-09-06 DIAGNOSIS — R809 Proteinuria, unspecified: Secondary | ICD-10-CM

## 2013-09-06 DIAGNOSIS — M109 Gout, unspecified: Secondary | ICD-10-CM

## 2013-09-06 DIAGNOSIS — I10 Essential (primary) hypertension: Secondary | ICD-10-CM

## 2013-09-06 DIAGNOSIS — G4733 Obstructive sleep apnea (adult) (pediatric): Secondary | ICD-10-CM

## 2013-09-06 DIAGNOSIS — IMO0001 Reserved for inherently not codable concepts without codable children: Secondary | ICD-10-CM

## 2013-09-06 DIAGNOSIS — E785 Hyperlipidemia, unspecified: Secondary | ICD-10-CM

## 2013-09-06 LAB — MICROALBUMIN / CREATININE URINE RATIO
Creatinine,U: 191.8 mg/dL
Microalb Creat Ratio: 4.6 mg/g (ref 0.0–30.0)
Microalb, Ur: 8.9 mg/dL — ABNORMAL HIGH (ref 0.0–1.9)

## 2013-09-06 LAB — HM DIABETES FOOT EXAM: HM Diabetic Foot Exam: NORMAL

## 2013-09-06 LAB — HEMOGLOBIN A1C: Hgb A1c MFr Bld: 6.9 % — ABNORMAL HIGH (ref 4.6–6.5)

## 2013-09-06 MED ORDER — FLUOCINONIDE-E 0.05 % EX CREA: 1.0000 "application " | TOPICAL_CREAM | Freq: Two times a day (BID) | CUTANEOUS | Status: DC

## 2013-09-06 MED ORDER — GUAIFENESIN-CODEINE 100-10 MG/5ML PO SYRP: 5.0000 mL | ORAL_SOLUTION | Freq: Three times a day (TID) | ORAL | Status: DC | PRN

## 2013-09-06 NOTE — Assessment & Plan Note (Signed)
Well controlled on current regimen. Renal function stable, no changes today. 

## 2013-09-06 NOTE — Progress Notes (Signed)
Pre-visit discussion using our clinic review tool. No additional management support is needed unless otherwise documented below in the visit note.  

## 2013-09-06 NOTE — Assessment & Plan Note (Signed)
No flares since starting allopurinol

## 2013-09-06 NOTE — Assessment & Plan Note (Addendum)
Lab Results  Component Value Date   HGBA1C 7.1* 06/08/2013   Well-controlled on diet alone . He does not check sugars.  His hemoglobin A1c has been at or less than 7.0 . Patient is up-to-date on eye exams and foot exam was done today.  There is minimal proteinuria on today's micro urinalysis .  Fasting lipids have been reviewed and statin therapy has been advised and updated according to new ACC guidelines based on patient's 10 year risk of CAD

## 2013-09-06 NOTE — Progress Notes (Signed)
Patient ID: Stephen Kelly, male   DOB: 12/15/1940, 73 y.o.   MRN: [id number]   Patient Active Problem List   Diagnosis Date Noted  . Encounter for initial preventive physical examination [facility] 03/09/2013  . Cough 09/03/2012  . Other and unspecified hyperlipidemia 03/03/2012  . Controlled type 2 diabetes mellitus with microalbuminuria or microproteinuria 08/31/2011  . Radial artery thrombosis, right 08/31/2011  . Actinic keratosis of left side of forehead 08/31/2011  . Patent foramen ovale   . Lupus anticoagulant inhibitor syndrome   . Hypertension 04/23/2011  . Screening for colon cancer 04/22/2011  . Sleep apnea, obstructive 04/22/2011  . Clotting disorder   . History of renal calculi   . Gout, arthritis   . Pulmonary nodule, right     Subjective:  CC:   Chief Complaint  Patient presents with  . Follow-up  . Diabetes    HPI:   Stephen Kelly is a 73 y.o. male who presents for Six-month followup on diet-controlled diabetes, hyper tension, and gout. He has had no gout episodes since last visit. He takes allopurinol daily with good tolerance. [name]. He does take lisinopril. He will he is physically active but does not exercise during the winter months. He has gained several pounds. He denies any numbness or tingling in his legs and has no claudication symptoms.       Past Medical History  Diagnosis Date  . Hypertension   . History of renal calculi 2001    history of lithotripsy ,  no stent  . Gout, arthritis     managed with allopurinol  . Pulmonary nodule, right 2011    unchanged, followed by gitten with serial ct  . Patent foramen ovale   . Lupus anticoagulant inhibitor syndrome     per gitten's test March 2012  . Clotting disorder 2011    Lupus Inhibitor positive by March 2012 studies    Past Surgical History  Procedure Laterality Date  . Thrombectomy      left brachicephalic artery       The following portions of the patient's history were  reviewed and updated as appropriate: Allergies, current medications, and problem list.    Review of Systems:   Patient denies headache, fevers, malaise, unintentional weight loss, skin rash, eye pain, sinus congestion and sinus pain, sore throat, dysphagia,  hemoptysis , cough, dyspnea, wheezing, chest pain, palpitations, orthopnea, edema, abdominal pain, nausea, melena, diarrhea, constipation, flank pain, dysuria, hematuria, urinary  Frequency, nocturia, numbness, tingling, seizures,  Focal weakness, Loss of consciousness,  Tremor, insomnia, depression, anxiety, and suicidal ideation.     History   Social History  . Marital Status: Married    Spouse Name: N/A    Number of Children: N/A  . Years of Education: N/A   Occupational History  . Not on file.   Social History Main Topics  . Smoking status: Never Smoker   . Smokeless tobacco: Never Used  . Alcohol Use: No  . Drug Use: No  . Sexual Activity: Not on file   Other Topics Concern  . Not on file   Social History Narrative  . No narrative on file    Objective:  Filed Vitals:   09/06/13 1355  BP: 144/78  Pulse: 84  Temp: 97.9 F (36.6 C)  Resp: 16     General appearance: alert, cooperative and appears stated age Ears: normal TM's and external ear canals both ears Throat: lips, mucosa, and tongue normal; teeth and gums  normal Neck: no adenopathy, no carotid bruit, supple, symmetrical, trachea midline and thyroid not enlarged, symmetric, no tenderness/mass/nodules Back: symmetric, no curvature. ROM normal. No CVA tenderness. Lungs: clear to auscultation bilaterally Heart: regular rate and rhythm, S1, S2 normal, no murmur, click, rub or gallop Abdomen: soft, non-tender; bowel sounds normal; no masses,  no organomegaly Pulses: 2+ and symmetric Skin: Skin color, texture, turgor normal. No rashes or lesions Lymph nodes: Cervical, supraclavicular, and axillary nodes normal. Foot exam:  Nails are well trimmed,  No  callouses,  Sensation intact to microfilament   Assessment and Plan:  Controlled type 2 diabetes mellitus with microalbuminuria or microproteinuria Lab Results  Component Value Date   HGBA1C 7.1* 06/08/2013   Well-controlled on diet alone . He does not check sugars.  His hemoglobin A1c has been at or less than 7.0 . Patient is up-to-date on eye exams and foot exam was done today.  There is minimal proteinuria on today's micro urinalysis .  Fasting lipids have been reviewed and statin therapy has been advised and updated according to new ACC guidelines based on patient's 10 year risk of CAD   Gout, arthritis No flares since starting allopurinol   Hypertension Well controlled on current regimen. Renal function stable, no changes today.  Other and unspecified hyperlipidemia Elevated triglycerides managed with gemfibrozil.  Lab Results  Component Value Date   CHOL 198 09/01/2013   HDL 36.00* 09/01/2013   LDLCALC 139* 09/01/2013   LDLDIRECT 78.5 06/08/2013   TRIG 115.0 09/01/2013   CHOLHDL 6 09/01/2013   Lab Results  Component Value Date   ALT 19 09/01/2013   AST 17 09/01/2013   ALKPHOS 50 09/01/2013   BILITOT 0.8 09/01/2013     Sleep apnea, obstructive Managed with weight loss of 30 lbs.  He ha sno signs of symptoms of untreated OSA> and does not want repeat testing.    Updated Medication List Outpatient Encounter Prescriptions as of 09/06/2013  Medication Sig  . allopurinol (ZYLOPRIM) 100 MG tablet TAKE 1 TABLET (100 MG TOTAL) BY MOUTH DAILY.  [name]. gemfibrozil (LOPID) 600 MG tablet Take 1 tablet (600 mg total) by mouth 2 (two) times daily before a meal.  . warfarin (COUMADIN) 2 MG tablet 4 mg on Fridays and Tuesdays,  2 mg all other days  . [DISCONTINUED] losartan (COZAAR) 50 MG tablet Take 1 tablet (50 mg total) by mouth daily.  . fluocinonide-emollient (LIDEX-E) 0.05 % cream Apply 1 application topically 2 (two) times daily.  [name]. guaiFENesin-codeine (ROBITUSSIN AC) 100-10 MG/5ML  syrup Take 5 mLs by mouth 3 (three) times daily as needed for cough.  [name]. omeprazole (PRILOSEC OTC) 20 MG tablet Take 20 mg by mouth daily.  [name]. zoster vaccine live, PF, (ZOSTAVAX) [id number] UNT/0.65ML injection Inject 19,400 Units into the skin once.     Orders Placed This Encounter  Procedures  . Pneumococcal conjugate vaccine 13-valent  . Hemoglobin A1c  . Microalbumin / creatinine urine ratio  . HM DIABETES FOOT EXAM    Return in about 3 months (around 12/06/2013) for follow up diabetes.

## 2013-09-06 NOTE — Patient Instructions (Signed)
You can have turnip greens once a week!  You can have alternative breads if they have < 13 carbs/serving

## 2013-09-07 ENCOUNTER — Telehealth: Payer: Self-pay | Admitting: Internal Medicine

## 2013-09-07 ENCOUNTER — Encounter: Payer: Self-pay | Admitting: *Deleted

## 2013-09-07 NOTE — Telephone Encounter (Signed)
Relevant patient education mailed to patient.  

## 2013-09-09 NOTE — Assessment & Plan Note (Addendum)
Elevated triglycerides managed with gemfibrozil.  Lab Results  Component Value Date   CHOL 198 09/01/2013   HDL 36.00* 09/01/2013   LDLCALC 139* 09/01/2013   LDLDIRECT 78.5 06/08/2013   TRIG 115.0 09/01/2013   CHOLHDL 6 09/01/2013   Lab Results  Component Value Date   ALT 19 09/01/2013   AST 17 09/01/2013   ALKPHOS 50 09/01/2013   BILITOT 0.8 09/01/2013

## 2013-09-09 NOTE — Assessment & Plan Note (Signed)
Managed with weight loss of 30 lbs.  He ha sno signs of symptoms of untreated OSA> and does not want repeat testing.

## 2013-09-20 ENCOUNTER — Telehealth: Payer: Self-pay

## 2013-09-20 NOTE — Telephone Encounter (Signed)
Relevant patient education mailed to patient.  

## 2013-09-22 ENCOUNTER — Other Ambulatory Visit: Payer: Self-pay | Admitting: *Deleted

## 2013-09-22 MED ORDER — GEMFIBROZIL 600 MG PO TABS: 600.0000 mg | ORAL_TABLET | Freq: Two times a day (BID) | ORAL | Status: DC

## 2013-11-17 ENCOUNTER — Other Ambulatory Visit: Payer: Self-pay | Admitting: *Deleted

## 2013-11-17 MED ORDER — WARFARIN SODIUM 2 MG PO TABS: ORAL_TABLET | ORAL | Status: DC

## 2013-12-06 ENCOUNTER — Ambulatory Visit (INDEPENDENT_AMBULATORY_CARE_PROVIDER_SITE_OTHER): Payer: Medicare Other | Admitting: Internal Medicine

## 2013-12-06 ENCOUNTER — Encounter: Payer: Self-pay | Admitting: Internal Medicine

## 2013-12-06 VITALS — BP 142/84 | HR 77 | Temp 97.9°F | Resp 18 | Ht 68.25 in | Wt 206.8 lb

## 2013-12-06 DIAGNOSIS — IMO0001 Reserved for inherently not codable concepts without codable children: Secondary | ICD-10-CM

## 2013-12-06 DIAGNOSIS — R809 Proteinuria, unspecified: Secondary | ICD-10-CM

## 2013-12-06 DIAGNOSIS — Z7901 Long term (current) use of anticoagulants: Secondary | ICD-10-CM

## 2013-12-06 DIAGNOSIS — E119 Type 2 diabetes mellitus without complications: Secondary | ICD-10-CM

## 2013-12-06 DIAGNOSIS — I10 Essential (primary) hypertension: Secondary | ICD-10-CM

## 2013-12-06 DIAGNOSIS — M109 Gout, unspecified: Secondary | ICD-10-CM

## 2013-12-06 DIAGNOSIS — E785 Hyperlipidemia, unspecified: Secondary | ICD-10-CM

## 2013-12-06 LAB — COMPREHENSIVE METABOLIC PANEL
ALT: 20 U/L (ref 0–53)
AST: 21 U/L (ref 0–37)
Albumin: 3.8 g/dL (ref 3.5–5.2)
Alkaline Phosphatase: 59 U/L (ref 39–117)
BUN: 27 mg/dL — ABNORMAL HIGH (ref 6–23)
CO2: 26 mEq/L (ref 19–32)
Calcium: 9.6 mg/dL (ref 8.4–10.5)
Chloride: 102 mEq/L (ref 96–112)
Creatinine, Ser: 1.3 mg/dL (ref 0.4–1.5)
GFR: 58.66 mL/min — ABNORMAL LOW (ref >60.00)
Glucose, Bld: 157 mg/dL — ABNORMAL HIGH (ref 70–99)
Potassium: 4.5 mEq/L (ref 3.5–5.1)
Sodium: 136 mEq/L (ref 135–145)
Total Bilirubin: 0.6 mg/dL (ref 0.2–1.2)
Total Protein: 7.5 g/dL (ref 6.0–8.3)

## 2013-12-06 LAB — LIPID PANEL
Cholesterol: 229 mg/dL — ABNORMAL HIGH (ref 0–200)
HDL: 33.1 mg/dL — ABNORMAL LOW (ref >39.00)
LDL Cholesterol: 154 mg/dL — ABNORMAL HIGH (ref 0–99)
NonHDL: 195.9
Total CHOL/HDL Ratio: 7
Triglycerides: 212 mg/dL — ABNORMAL HIGH (ref 0.0–149.0)
VLDL: 42.4 mg/dL — ABNORMAL HIGH (ref 0.0–40.0)

## 2013-12-06 LAB — MICROALBUMIN / CREATININE URINE RATIO
Creatinine,U: 127.6 mg/dL
Microalb Creat Ratio: 6 mg/g (ref 0.0–30.0)
Microalb, Ur: 7.6 mg/dL — ABNORMAL HIGH (ref 0.0–1.9)

## 2013-12-06 LAB — PROTIME-INR
INR: 3 ratio — ABNORMAL HIGH (ref 0.8–1.0)
Prothrombin Time: 32.8 s — ABNORMAL HIGH (ref 9.6–13.1)

## 2013-12-06 LAB — HEMOGLOBIN A1C: Hgb A1c MFr Bld: 7.4 % — ABNORMAL HIGH (ref 4.6–6.5)

## 2013-12-06 LAB — HM DIABETES FOOT EXAM: HM Diabetic Foot Exam: NORMAL

## 2013-12-06 MED ORDER — LOSARTAN POTASSIUM 100 MG PO TABS: 100.0000 mg | ORAL_TABLET | Freq: Every day | ORAL | Status: DC

## 2013-12-06 MED ORDER — WARFARIN SODIUM 2 MG PO TABS: ORAL_TABLET | ORAL | Status: DC

## 2013-12-06 NOTE — Patient Instructions (Signed)
Good job on the weight loss !  Your goal to get your BMI < 30 is a weight of  198 lbs  Remember that eating a small low carb snack every 3 hours while awake will HELP you lose weight by keeping your metabolism more active  I have increased your losartan to 100 mg daily because your blood pressure was not at goal  Hypertension Hypertension, commonly called high blood pressure, is when the force of blood pumping through your arteries is too strong. Your arteries are the blood vessels that carry blood from your heart throughout your body. A blood pressure reading consists of a higher number over a lower number, such as 110/72. The higher number (systolic) is the pressure inside your arteries when your heart pumps. The lower number (diastolic) is the pressure inside your arteries when your heart relaxes. Ideally you want your blood pressure below 120/80. Hypertension forces your heart to work harder to pump blood. Your arteries may become narrow or stiff. Having hypertension puts you at risk for heart disease, stroke, and other problems.  RISK FACTORS Some risk factors for high blood pressure are controllable. Others are not.  Risk factors you cannot control include:   Race. You may be at higher risk if you are African American.  Age. Risk increases with age.  Gender. Men are at higher risk than women before age 7 years. After age 4, women are at higher risk than men. Risk factors you can control include:  Not getting enough exercise or physical activity.  Being overweight.  Getting too much fat, sugar, calories, or salt in your diet.  Drinking too much alcohol. SIGNS AND SYMPTOMS Hypertension does not usually cause signs or symptoms. Extremely high blood pressure (hypertensive crisis) may cause headache, anxiety, shortness of breath, and nosebleed. DIAGNOSIS  To check if you have hypertension, your health care provider will measure your blood pressure while you are seated, with your  arm held at the level of your heart. It should be measured at least twice using the same arm. Certain conditions can cause a difference in blood pressure between your right and left arms. A blood pressure reading that is higher than normal on one occasion does not mean that you need treatment. If one blood pressure reading is high, ask your health care provider about having it checked again. TREATMENT  Treating high blood pressure includes making lifestyle changes and possibly taking medication. Living a healthy lifestyle can help lower high blood pressure. You may need to change some of your habits. Lifestyle changes may include:  Following the DASH diet. This diet is high in fruits, vegetables, and whole grains. It is low in salt, red meat, and added sugars.  Getting at least 2 1/2 hours of brisk physical activity every week.  Losing weight if necessary.  Not smoking.  Limiting alcoholic beverages.  Learning ways to reduce stress. If lifestyle changes are not enough to get your blood pressure under control, your health care provider may prescribe medicine. You may need to take more than one. Work closely with your health care provider to understand the risks and benefits. HOME CARE INSTRUCTIONS  Have your blood pressure rechecked as directed by your health care provider.   Only take medicine as directed by your health care provider. Follow the directions carefully. Blood pressure medicines must be taken as prescribed. The medicine does not work as well when you skip doses. Skipping doses also puts you at risk for problems.   Do  not smoke.   Monitor your blood pressure at home as directed by your health care provider. SEEK MEDICAL CARE IF:   You think you are having a reaction to medicines taken.  You have recurrent headaches or feel dizzy.  You have swelling in your ankles.  You have trouble with your vision. SEEK IMMEDIATE MEDICAL CARE IF:  You develop a severe headache  or confusion.  You have unusual weakness, numbness, or feel faint.  You have severe chest or abdominal pain.  You vomit repeatedly.  You have trouble breathing. MAKE SURE YOU:   Understand these instructions.  Will watch your condition.  Will get help right away if you are not doing well or get worse. Document Released: 05/11/2005 Document Revised: 05/16/2013 Document Reviewed: 03/03/2013 [facility] Patient Information 2015 [place], [place]. This information is not intended to replace advice given to you by your health care provider. Make sure you discuss any questions you have with your health care provider. Diabetes and Exercise Exercising regularly is important. It is not just about losing weight. It has many health benefits, such as:  Improving your overall fitness, flexibility, and endurance.  Increasing your bone density.  Helping with weight control.  Decreasing your body fat.  Increasing your muscle strength.  Reducing stress and tension.  Improving your overall health. People with diabetes who exercise gain additional benefits because exercise:  Reduces appetite.  Improves the body's use of blood sugar (glucose).  Helps lower or control blood glucose.  Decreases blood pressure.  Helps control blood lipids (such as cholesterol and triglycerides).  Improves the body's use of the hormone insulin by:  Increasing the body's insulin sensitivity.  Reducing the body's insulin needs.  Decreases the risk for heart disease because exercising:  Lowers cholesterol and triglycerides levels.  Increases the levels of good cholesterol (such as high-density lipoproteins [HDL]) in the body.  Lowers blood glucose levels. YOUR ACTIVITY PLAN  Choose an activity that you enjoy and set realistic goals. Your health care provider or diabetes educator can help you make an activity plan that works for you. You can break activities into 2 or 3 sessions throughout the day. Doing  so is as good as one long session. Exercise ideas include:  Taking the dog for a walk.  Taking the stairs instead of the elevator.  Dancing to your favorite song.  Doing your favorite exercise with a friend. RECOMMENDATIONS FOR EXERCISING WITH TYPE 1 OR TYPE 2 DIABETES   Check your blood glucose before exercising. If blood glucose levels are greater than 240 mg/dL, check for urine ketones. Do not exercise if ketones are present.  Avoid injecting insulin into areas of the body that are going to be exercised. For example, avoid injecting insulin into:  The arms when playing tennis.  The legs when jogging.  Keep a record of:  Food intake before and after you exercise.  Expected peak times of insulin action.  Blood glucose levels before and after you exercise.  The type and amount of exercise you have done.  Review your records with your health care provider. Your health care provider will help you to develop guidelines for adjusting food intake and insulin amounts before and after exercising.  If you take insulin or oral hypoglycemic agents, watch for signs and symptoms of hypoglycemia. They include:  Dizziness.  Shaking.  Sweating.  Chills.  Confusion.  Drink plenty of water while you exercise to prevent dehydration or heat stroke. Body water is lost during exercise and  must be replaced.  Talk to your health care provider before starting an exercise program to make sure it is safe for you. Remember, almost any type of activity is better than none. Document Released: 08/01/2003 Document Revised: 01/11/2013 Document Reviewed: 10/18/2012 [facility] Patient Information 2015 [place], [place]. This information is not intended to replace advice given to you by your health care provider. Make sure you discuss any questions you have with your health care provider.

## 2013-12-06 NOTE — Assessment & Plan Note (Signed)
Has had no episodes in over  a year,  continue daily allopurinol

## 2013-12-06 NOTE — Progress Notes (Signed)
Pre-visit discussion using our clinic review tool. No additional management support is needed unless otherwise documented below in the visit note.  

## 2013-12-08 ENCOUNTER — Encounter: Payer: Self-pay | Admitting: *Deleted

## 2013-12-08 DIAGNOSIS — Z7901 Long term (current) use of anticoagulants: Secondary | ICD-10-CM | POA: Insufficient documentation

## 2013-12-08 MED ORDER — ATORVASTATIN CALCIUM 20 MG PO TABS: 20.0000 mg | ORAL_TABLET | Freq: Every day | ORAL | Status: DC

## 2013-12-08 NOTE — Assessment & Plan Note (Signed)
   cholesterol is too high on gemfibrozil alone. Your LDL should be < 100 and it is currently 154.  I am recommending a change in therapy . stop the gemfibrozil. Start atorvastatin.     return in 3 months and have fasting labs done prior to your visit.

## 2013-12-08 NOTE — Assessment & Plan Note (Signed)
diabetes is still under good control on current regimen, but your a1c has risen slightly . And is now 7.4   (goal is < 7.0)  no medication changes are needed , but please try to limit the number of complex carbohydrates (starches, including white potatoes, Cereals, Cookies and cake) in your diet to as few as possible and make sure you are getting some exercise regularly .  Please return in 3 months and have fasting labs done prior to your visit.

## 2013-12-08 NOTE — Assessment & Plan Note (Addendum)
Not Well controlled on current regimen. Renal function stable, increasing dose to 100 mg daily  Lab Results  Component Value Date   NA 136 12/06/2013   K 4.5 12/06/2013   CL 102 12/06/2013   CO2 26 12/06/2013   Lab Results  Component Value Date   CREATININE 1.3 12/06/2013

## 2013-12-08 NOTE — Progress Notes (Signed)
Patient ID: Stephen Kelly, male   DOB: 12/29/1940, 73 y.o.   MRN: [id number]   Patient Active Problem List   Diagnosis Date Noted  . Long term (current) use of anticoagulants 12/08/2013  . [facility] physical [facility]  . Cough 09/03/2012  . Other and unspecified hyperlipidemia 03/03/2012  . Controlled type 2 diabetes mellitus with microalbuminuria or microproteinuria 08/31/2011  . Radial artery thrombosis, right 08/31/2011  . Actinic keratosis of left side of forehead 08/31/2011  . Patent foramen ovale   . Lupus anticoagulant inhibitor syndrome   . Hypertension 04/23/2011  . Screening for colon cancer 04/22/2011  . Sleep apnea, obstructive 04/22/2011  . Clotting disorder   . History of renal calculi   . Gout, arthritis   . Pulmonary nodule, right     Subjective:  CC:   Chief Complaint  Patient presents with  . Follow-up  . Diabetes    HPI:   Stephen Kelly is a 73 y.o. male who presents for 3 month follow up on Type 2 DM,   Hypertension, hyperlipidemia and history of DVT due to hypercoag state. Patient has no complaints today.  Patient is following a low glycemic index diet and taking all prescribed medications regularly without side effects.  Fasting sugars have been under less than 140 most of the time and post prandials have been under 160 except on rare occasions. Patient is exercising about 3 times per week and intentionally trying to lose weight .  Patient has had an eye exam in the last 12 months and checks feet regularly for signs of infection.  Patient does not walk barefoot outside,  And denies an numbness tingling or burning in feet. Patient is up to date on all recommended vaccinations   Past Medical History  Diagnosis Date  . Hypertension   . History of renal calculi 2001    history of lithotripsy ,  no stent  . Gout, arthritis     managed with allopurinol  . Pulmonary nodule, right 2011     unchanged, followed by gitten with serial ct  . Patent foramen ovale   . Lupus anticoagulant inhibitor syndrome     per gitten's test March 2012  . Clotting disorder 2011    Lupus Inhibitor positive by March 2012 studies    Past Surgical History  Procedure Laterality Date  . Thrombectomy      left brachicephalic artery       The following portions of the patient's history were reviewed and updated as appropriate: Allergies, current medications, and problem list.    Review of Systems:   Patient denies headache, fevers, malaise, unintentional weight loss, skin rash, eye pain, sinus congestion and sinus pain, sore throat, dysphagia,  hemoptysis , cough, dyspnea, wheezing, chest pain, palpitations, orthopnea, edema, abdominal pain, nausea, melena, diarrhea, constipation, flank pain, dysuria, hematuria, urinary  Frequency, nocturia, numbness, tingling, seizures,  Focal weakness, Loss of consciousness,  Tremor, insomnia, depression, anxiety, and suicidal ideation.     History   Social History  . Marital Status: Married    Spouse Name: N/A    Number of Children: N/A  . Years of Education: N/A   Occupational History  . Not on file.   Social History Main Topics  . Smoking status: Never Smoker   . Smokeless tobacco: Never Used  . Alcohol Use: No  . Drug Use: No  . Sexual Activity: Not on file   Other Topics Concern  .  Not on file   Social History Narrative  . No narrative on file    Objective:  Filed Vitals:   12/06/13 0905  BP: 142/84  Pulse: 77  Temp: 97.9 F (36.6 C)  Resp: 18     General appearance: alert, cooperative and appears stated age Ears: normal TM's and external ear canals both ears Throat: lips, mucosa, and tongue normal; teeth and gums normal Neck: no adenopathy, no carotid bruit, supple, symmetrical, trachea midline and thyroid not enlarged, symmetric, no tenderness/mass/nodules Back: symmetric, no curvature. ROM normal. No CVA  tenderness. Lungs: clear to auscultation bilaterally Heart: regular rate and rhythm, S1, S2 normal, no murmur, click, rub or gallop Abdomen: soft, non-tender; bowel sounds normal; no masses,  no organomegaly Pulses: 2+ and symmetric Skin: Skin color, texture, turgor normal. No rashes or lesions Lymph nodes: Cervical, supraclavicular, and axillary nodes normal.  Assessment and Plan:  Gout, arthritis Has had no episodes in over  a year,  continue daily allopurinol   Other and unspecified hyperlipidemia   cholesterol is too high on gemfibrozil alone. Your LDL should be < 100 and it is currently 154.  I am recommending a change in therapy . stop the gemfibrozil. Start atorvastatin.     return in 3 months and have fasting labs done prior to your visit.      Controlled type 2 diabetes mellitus with microalbuminuria or microproteinuria diabetes is still under good control on current regimen, but your a1c has risen slightly . And is now 7.4   (goal is < 7.0)  no medication changes are needed , but please try to limit the number of complex carbohydrates (starches, including white potatoes, Cereals, Cookies and cake) in your diet to as few as possible and make sure you are getting some exercise regularly .  Please return in 3 months and have fasting labs done prior to your visit.    Long term (current) use of anticoagulants Coumadin level is therapeutic,  Continue current regimen and repeat PT/INR in one month./    Hypertension Not Well controlled on current regimen. Renal function stable, increasing dose to 100 mg daily  Lab Results  Component Value Date   NA 136 12/06/2013   K 4.5 12/06/2013   CL 102 12/06/2013   CO2 26 12/06/2013   Lab Results  Component Value Date   CREATININE 1.3 12/06/2013       Updated Medication List Outpatient Encounter Prescriptions as of 12/06/2013  Medication Sig  . allopurinol (ZYLOPRIM) 100 MG tablet TAKE 1 TABLET (100 MG TOTAL) BY MOUTH DAILY.  [name]  atorvastatin (LIPITOR) 20 MG tablet Take 1 tablet (20 mg total) by mouth daily.  . fluocinonide-emollient (LIDEX-E) 0.05 % cream Apply 1 application topically 2 (two) times daily.  [name] guaiFENesin-codeine (ROBITUSSIN AC) 100-10 MG/5ML syrup Take 5 mLs by mouth 3 (three) times daily as needed for cough.  . losartan (COZAAR) 100 MG tablet Take 1 tablet (100 mg total) by mouth daily.  [name] omeprazole (PRILOSEC OTC) 20 MG tablet Take 20 mg by mouth daily.  [name] warfarin (COUMADIN) 2 MG tablet 2 on Tuesday,  2 on Friday ,  1 all other days  . zoster vaccine live, PF, (ZOSTAVAX) 96045 UNT/0.65ML injection Inject 19,400 Units into the skin once.  . [DISCONTINUED] gemfibrozil (LOPID) 600 MG tablet Take 1 tablet (600 mg total) by mouth 2 (two) times daily before a meal.  . [DISCONTINUED] losartan (COZAAR) 50 MG tablet TAKE 1 TABLET (50 MG TOTAL) BY MOUTH  DAILY.  . [DISCONTINUED] warfarin (COUMADIN) 2 MG tablet Take as directed     Orders Placed This Encounter  Procedures  . Hemoglobin A1c  . Microalbumin / creatinine urine ratio  . Lipid panel  . Comprehensive metabolic panel  . Protime-INR  . Protime-INR  . Hemoglobin A1c  . Lipid panel  . Comprehensive metabolic panel  . HM DIABETES FOOT EXAM    Return in about 3 months (around 03/08/2014).

## 2013-12-08 NOTE — Assessment & Plan Note (Signed)
Coumadin level is therapeutic,  Continue current regimen and repeat PT/INR in one month 

## 2013-12-12 ENCOUNTER — Telehealth: Payer: Self-pay | Admitting: Internal Medicine

## 2013-12-12 NOTE — Telephone Encounter (Signed)
Patient was confused as to medication change patient advised to stop the gemfibrozil and to start Lipitor.

## 2014-02-05 ENCOUNTER — Ambulatory Visit (INDEPENDENT_AMBULATORY_CARE_PROVIDER_SITE_OTHER): Payer: Medicare Other | Admitting: Internal Medicine

## 2014-02-05 ENCOUNTER — Encounter: Payer: Self-pay | Admitting: Internal Medicine

## 2014-02-05 VITALS — BP 148/88 | HR 83 | Temp 97.9°F | Resp 16 | Ht 68.25 in | Wt 208.0 lb

## 2014-02-05 DIAGNOSIS — E785 Hyperlipidemia, unspecified: Secondary | ICD-10-CM

## 2014-02-05 DIAGNOSIS — Z79899 Other long term (current) drug therapy: Secondary | ICD-10-CM

## 2014-02-05 DIAGNOSIS — M130 Polyarthritis, unspecified: Secondary | ICD-10-CM

## 2014-02-05 MED ORDER — TRAMADOL HCL 50 MG PO TABS: 50.0000 mg | ORAL_TABLET | Freq: Three times a day (TID) | ORAL | Status: DC | PRN

## 2014-02-05 NOTE — Progress Notes (Signed)
Pre-visit discussion using our clinic review tool. No additional management support is needed unless otherwise documented below in the visit note.  

## 2014-02-05 NOTE — Patient Instructions (Signed)
I am prescribing  tramadol for your pain.  It will not interfere with your coumadin.  You can add tylenol to it if needed   I am checking your electrolytes and muscle enzymes and some other tests

## 2014-02-06 ENCOUNTER — Encounter: Payer: Self-pay | Admitting: Internal Medicine

## 2014-02-06 DIAGNOSIS — E785 Hyperlipidemia, unspecified: Secondary | ICD-10-CM | POA: Insufficient documentation

## 2014-02-06 NOTE — Assessment & Plan Note (Signed)
Atorvastatin was prescribed in July for elevated LDL in setting of Type 2 DM and hypertension,.  He will return for fasting lipids.

## 2014-02-06 NOTE — Progress Notes (Signed)
Patient ID: Stephen Kelly, male   DOB: [birth year], 73 y.o.   MRN: [id number]   Patient Active Problem List   Diagnosis Date Noted  . Hyperlipidemia   . Polyarthritis of shoulder 02/05/2014  . Long term (current) use of anticoagulants 12/08/2013  . Encounter for initial preventive physical examination covered by [facility] 03/09/2013  . Cough 09/03/2012  . Other and unspecified hyperlipidemia 03/03/2012  . Controlled type 2 diabetes mellitus with microalbuminuria or microproteinuria 08/31/2011  . Radial artery thrombosis, right 08/31/2011  . Actinic keratosis of left side of forehead 08/31/2011  . Patent foramen ovale   . Lupus anticoagulant inhibitor syndrome   . Hypertension 04/23/2011  . Screening for colon cancer 04/22/2011  . Sleep apnea, obstructive 04/22/2011  . Clotting disorder   . History of renal calculi   . Gout, arthritis   . Pulmonary nodule, right     Subjective:  CC:   Chief Complaint  Patient presents with  . Fatigue    bilateral shoulder pain and in back of knees    HPI:   Stephen Kelly is a 73 y.o. male who presents for Acute visit to discuss subacute development of  Fatigue, muscle and joint pain and stiffness.  Patient states that after mowing a lawn last Thursday, which took several hours with a push mower,  He felt completely fatigued an unable to continue any exertional activity, despite hydrating with water (34 ounces total).  He denies chest pain and dyspnea, but noted that both sides of his lateral chest wall (points to pectoralis muscles on both sides of chest) were tender to touch for days, along with the backs of both legs.  Additionally, for the past 3 weeks he has noted weakness of both hands, decreased grip strength   And bilateral swelling of fingers, to the point that he cannot close his fist.  He denies any new medications, but did start atorvastatin for hyperlipidemia in mid July.  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    Past Medical History  Diagnosis Date  . Hypertension   . History of renal calculi 2001    history of lithotripsy ,  no stent  . Gout, arthritis     managed with allopurinol  . Pulmonary nodule, right 2011    unchanged, followed by gitten with serial ct  . Patent foramen ovale   . Lupus anticoagulant inhibitor syndrome     per gitten's test March 2012  . Clotting disorder 2011    Lupus Inhibitor positive by March 2012 studies  . Hyperlipidemia     Past Surgical History  Procedure Laterality Date  . Thrombectomy      left brachicephalic artery       The following portions of the patient's history were reviewed and updated as appropriate: Allergies, current medications, and problem list.    Review of Systems:   Patient denies headache, fevers, malaise, unintentional weight loss, skin rash, eye pain, sinus congestion and sinus pain, sore throat, dysphagia,  hemoptysis , cough, dyspnea, wheezing, chest pain, palpitations, orthopnea, edema, abdominal pain, nausea, melena, diarrhea, constipation, flank pain, dysuria, hematuria, urinary  Frequency, nocturia, numbness, tingling, seizures,  Focal weakness, Loss of consciousness,  Tremor, insomnia, depression, anxiety, and suicidal ideation.     History   Social History  . Marital Status: Married    Spouse Name: N/A    Number of Children: N/A  . Years of Education: N/A  Occupational History  . Not on file.   Social History Main Topics  . Smoking status: Never Smoker   . Smokeless tobacco: Never Used  . Alcohol Use: No  . Drug Use: No  . Sexual Activity: Not on file   Other Topics Concern  . Not on file   Social History Narrative  . No narrative on file    Objective:  Filed Vitals:   02/05/14 1840  BP: 148/88  Pulse: 83  Temp: 97.9 F (36.6 C)  Resp: 16     General appearance: alert, cooperative and appears stated age Ears: normal TM's and external ear canals both  ears Throat: lips, mucosa, and tongue normal; teeth and gums normal Neck: no adenopathy, no carotid bruit, supple, symmetrical, trachea midline and thyroid not enlarged, symmetric, no tenderness/mass/nodules Back: symmetric, no curvature. ROM normal. No CVA tenderness. Lungs: clear to auscultation bilaterally Heart: regular rate and rhythm, S1, S2 normal, no murmur, click, rub or gallop Abdomen: soft, non-tender; bowel sounds normal; no masses,  no organomegaly Pulses: 2+ and symmetric Skin: Skin color, texture, turgor normal. No rashes or lesions Lymph nodes: Cervical, supraclavicular, and axillary nodes normal. MSK:  Decreased grip strength bilaterally,  No proximal muscle weakness.   Assessment and Plan:  Polyarthritis of shoulder His subacute onset of joint and muscle pain may be due to dehydration,  Rheumatologic disorder or statin intolerance. History of gout,  On allopurinol ,  Checking uric acid level.  Will rule out the former and consider suspending statin for a few months if all labs are normal. Avoiding NSAIDs due to chronic anticoagulation with coumadin.   Hyperlipidemia Atorvastatin was prescribed in July for elevated LDL in setting of Type 2 DM and hypertension,.  He will return for fasting lipids.    Updated Medication List Outpatient Encounter Prescriptions as of 02/05/2014  Medication Sig  . allopurinol (ZYLOPRIM) 100 MG tablet TAKE 1 TABLET (100 MG TOTAL) BY MOUTH DAILY.  [name] atorvastatin (LIPITOR) 20 MG tablet Take 1 tablet (20 mg total) by mouth daily.  . fluocinonide-emollient (LIDEX-E) 0.05 % cream Apply 1 application topically 2 (two) times daily.  [name] guaiFENesin-codeine (ROBITUSSIN AC) 100-10 MG/5ML syrup Take 5 mLs by mouth 3 (three) times daily as needed for cough.  . losartan (COZAAR) 100 MG tablet Take 1 tablet (100 mg total) by mouth daily.  [name] omeprazole (PRILOSEC OTC) 20 MG tablet Take 20 mg by mouth daily.  [name] warfarin (COUMADIN) 2 MG tablet 2 on Tuesday,  2  on Friday ,  1 all other days  . zoster vaccine live, PF, (ZOSTAVAX) 16109 UNT/0.65ML injection Inject 19,400 Units into the skin once.  . traMADol (ULTRAM) 50 MG tablet Take 1 tablet (50 mg total) by mouth every 8 (eight) hours as needed.

## 2014-02-06 NOTE — Assessment & Plan Note (Addendum)
His subacute onset of joint and muscle pain may be due to dehydration,  Rheumatologic disorder or statin intolerance. History of gout,  On allopurinol ,  Checking uric acid level.  Will rule out the former and consider suspending statin for a few months if all labs are normal. Avoiding NSAIDs due to chronic anticoagulation with coumadin.

## 2014-02-07 ENCOUNTER — Other Ambulatory Visit (INDEPENDENT_AMBULATORY_CARE_PROVIDER_SITE_OTHER): Payer: Medicare Other

## 2014-02-07 DIAGNOSIS — E785 Hyperlipidemia, unspecified: Secondary | ICD-10-CM

## 2014-02-07 DIAGNOSIS — Z79899 Other long term (current) drug therapy: Secondary | ICD-10-CM

## 2014-02-07 DIAGNOSIS — M130 Polyarthritis, unspecified: Secondary | ICD-10-CM

## 2014-02-07 LAB — COMPREHENSIVE METABOLIC PANEL
ALT: 16 U/L (ref 0–53)
AST: 17 U/L (ref 0–37)
Albumin: 3.7 g/dL (ref 3.5–5.2)
Alkaline Phosphatase: 65 U/L (ref 39–117)
BUN: 30 mg/dL — ABNORMAL HIGH (ref 6–23)
CO2: 27 mEq/L (ref 19–32)
Calcium: 8.9 mg/dL (ref 8.4–10.5)
Chloride: 105 mEq/L (ref 96–112)
Creatinine, Ser: 1.3 mg/dL (ref 0.4–1.5)
GFR: 56.58 mL/min — ABNORMAL LOW (ref >60.00)
Glucose, Bld: 223 mg/dL — ABNORMAL HIGH (ref 70–99)
Potassium: 4.2 mEq/L (ref 3.5–5.1)
Sodium: 138 mEq/L (ref 135–145)
Total Bilirubin: 0.7 mg/dL (ref 0.2–1.2)
Total Protein: 7.3 g/dL (ref 6.0–8.3)

## 2014-02-07 LAB — CBC WITH DIFFERENTIAL/PLATELET
Basophils Absolute: 0.1 10*3/uL (ref 0.0–0.1)
Basophils Relative: 0.5 % (ref 0.0–3.0)
Eosinophils Absolute: 0.3 10*3/uL (ref 0.0–0.7)
Eosinophils Relative: 3.1 % (ref 0.0–5.0)
HCT: 34.9 % — ABNORMAL LOW (ref 39.0–52.0)
Hemoglobin: 11.7 g/dL — ABNORMAL LOW (ref 13.0–17.0)
Lymphocytes Relative: 16.7 % (ref 12.0–46.0)
Lymphs Abs: 1.8 10*3/uL (ref 0.7–4.0)
MCHC: 33.6 g/dL (ref 30.0–36.0)
MCV: 86.5 fl (ref 78.0–100.0)
Monocytes Absolute: 0.7 10*3/uL (ref 0.1–1.0)
Monocytes Relative: 6.3 % (ref 3.0–12.0)
Neutro Abs: 7.8 10*3/uL — ABNORMAL HIGH (ref 1.4–7.7)
Neutrophils Relative %: 73.4 % (ref 43.0–77.0)
Platelets: 303 10*3/uL (ref 150.0–400.0)
RBC: 4.03 Mil/uL — ABNORMAL LOW (ref 4.22–5.81)
RDW: 14.4 % (ref 11.5–15.5)
WBC: 10.6 10*3/uL — ABNORMAL HIGH (ref 4.0–10.5)

## 2014-02-07 LAB — LIPID PANEL
Cholesterol: 116 mg/dL (ref 0–200)
HDL: 27.6 mg/dL — ABNORMAL LOW (ref >39.00)
LDL Cholesterol: 50 mg/dL (ref 0–99)
NonHDL: 88.4
Total CHOL/HDL Ratio: 4
Triglycerides: 192 mg/dL — ABNORMAL HIGH (ref 0.0–149.0)
VLDL: 38.4 mg/dL (ref 0.0–40.0)

## 2014-02-07 LAB — TSH: TSH: 2.73 u[IU]/mL (ref 0.35–4.50)

## 2014-02-07 LAB — FERRITIN: Ferritin: 68.7 ng/mL (ref 22.0–322.0)

## 2014-02-07 LAB — IRON AND TIBC
%SAT: 26 % (ref 20–55)
Iron: 63 ug/dL (ref 42–165)
TIBC: 242 ug/dL (ref 215–435)
UIBC: 179 ug/dL (ref 125–400)

## 2014-02-07 LAB — URIC ACID: Uric Acid, Serum: 7.1 mg/dL (ref 4.0–7.8)

## 2014-02-07 LAB — RHEUMATOID FACTOR: Rhuematoid fact SerPl-aCnc: <10 IU/mL (ref <=14)

## 2014-02-07 LAB — CK: Total CK: 31 U/L (ref 7–232)

## 2014-02-07 LAB — SEDIMENTATION RATE: Sed Rate: 11 mm/hr (ref 0–22)

## 2014-02-08 LAB — ANA W/REFLEX IF POSITIVE: Anti Nuclear Antibody(ANA): NEGATIVE

## 2014-02-09 LAB — LYME ABY, WSTRN BLT IGG & IGM W/BANDS

## 2014-03-06 ENCOUNTER — Other Ambulatory Visit: Payer: Self-pay | Admitting: *Deleted

## 2014-03-06 ENCOUNTER — Other Ambulatory Visit: Payer: Self-pay | Admitting: Internal Medicine

## 2014-03-06 MED ORDER — ALLOPURINOL 100 MG PO TABS: ORAL_TABLET | ORAL | Status: DC

## 2014-03-08 ENCOUNTER — Telehealth: Payer: Self-pay | Admitting: Internal Medicine

## 2014-03-08 DIAGNOSIS — IMO0001 Reserved for inherently not codable concepts without codable children: Secondary | ICD-10-CM

## 2014-03-08 NOTE — Telephone Encounter (Signed)
Spoke with pt , advised labs are ordered.

## 2014-03-08 NOTE — Telephone Encounter (Signed)
YES, HE WAS ADVISED TO HAVE FASTING LABS DONE PRIOR TO VISIT,  LABS ARE ORDERED

## 2014-03-08 NOTE — Telephone Encounter (Signed)
Does this patient need labs done before his appointment on Monday.

## 2014-03-09 ENCOUNTER — Other Ambulatory Visit (INDEPENDENT_AMBULATORY_CARE_PROVIDER_SITE_OTHER): Payer: Medicare Other

## 2014-03-09 DIAGNOSIS — E119 Type 2 diabetes mellitus without complications: Secondary | ICD-10-CM

## 2014-03-09 DIAGNOSIS — R809 Proteinuria, unspecified: Secondary | ICD-10-CM

## 2014-03-09 DIAGNOSIS — IMO0001 Reserved for inherently not codable concepts without codable children: Secondary | ICD-10-CM

## 2014-03-09 LAB — COMPREHENSIVE METABOLIC PANEL
ALT: 19 U/L (ref 0–53)
AST: 17 U/L (ref 0–37)
Albumin: 3.3 g/dL — ABNORMAL LOW (ref 3.5–5.2)
Alkaline Phosphatase: 55 U/L (ref 39–117)
BUN: 26 mg/dL — ABNORMAL HIGH (ref 6–23)
CO2: 21 mEq/L (ref 19–32)
Calcium: 9 mg/dL (ref 8.4–10.5)
Chloride: 108 mEq/L (ref 96–112)
Creatinine, Ser: 1.2 mg/dL (ref 0.4–1.5)
GFR: 64.38 mL/min (ref >60.00)
Glucose, Bld: 154 mg/dL — ABNORMAL HIGH (ref 70–99)
Potassium: 4 mEq/L (ref 3.5–5.1)
Sodium: 140 mEq/L (ref 135–145)
Total Bilirubin: 0.7 mg/dL (ref 0.2–1.2)
Total Protein: 7.3 g/dL (ref 6.0–8.3)

## 2014-03-09 LAB — LIPID PANEL
Cholesterol: 111 mg/dL (ref 0–200)
HDL: 21.5 mg/dL — ABNORMAL LOW (ref >39.00)
NonHDL: 89.5
Total CHOL/HDL Ratio: 5
Triglycerides: 213 mg/dL — ABNORMAL HIGH (ref 0.0–149.0)
VLDL: 42.6 mg/dL — ABNORMAL HIGH (ref 0.0–40.0)

## 2014-03-09 LAB — HEMOGLOBIN A1C: Hgb A1c MFr Bld: 7.4 % — ABNORMAL HIGH (ref 4.6–6.5)

## 2014-03-12 ENCOUNTER — Encounter: Payer: Self-pay | Admitting: Internal Medicine

## 2014-03-12 ENCOUNTER — Ambulatory Visit (INDEPENDENT_AMBULATORY_CARE_PROVIDER_SITE_OTHER): Payer: Medicare Other | Admitting: Internal Medicine

## 2014-03-12 VITALS — BP 158/82 | HR 80 | Temp 97.6°F | Resp 16 | Ht 69.0 in | Wt 206.2 lb

## 2014-03-12 DIAGNOSIS — Z23 Encounter for immunization: Secondary | ICD-10-CM

## 2014-03-12 DIAGNOSIS — Z125 Encounter for screening for malignant neoplasm of prostate: Secondary | ICD-10-CM

## 2014-03-12 DIAGNOSIS — R809 Proteinuria, unspecified: Secondary | ICD-10-CM

## 2014-03-12 DIAGNOSIS — E1169 Type 2 diabetes mellitus with other specified complication: Secondary | ICD-10-CM

## 2014-03-12 DIAGNOSIS — IMO0001 Reserved for inherently not codable concepts without codable children: Secondary | ICD-10-CM

## 2014-03-12 DIAGNOSIS — Z0000 Encounter for general adult medical examination without abnormal findings: Secondary | ICD-10-CM

## 2014-03-12 DIAGNOSIS — E785 Hyperlipidemia, unspecified: Secondary | ICD-10-CM

## 2014-03-12 DIAGNOSIS — E119 Type 2 diabetes mellitus without complications: Secondary | ICD-10-CM

## 2014-03-12 DIAGNOSIS — I10 Essential (primary) hypertension: Secondary | ICD-10-CM

## 2014-03-12 DIAGNOSIS — Z7901 Long term (current) use of anticoagulants: Secondary | ICD-10-CM

## 2014-03-12 LAB — MICROALBUMIN / CREATININE URINE RATIO
Creatinine,U: 147.3 mg/dL
Microalb Creat Ratio: 16.4 mg/g (ref 0.0–30.0)
Microalb, Ur: 24.1 mg/dL — ABNORMAL HIGH (ref 0.0–1.9)

## 2014-03-12 LAB — PROTIME-INR
INR: 2.3 ratio — ABNORMAL HIGH (ref 0.8–1.0)
Prothrombin Time: 24.9 s — ABNORMAL HIGH (ref 9.6–13.1)

## 2014-03-12 LAB — HM DIABETES FOOT EXAM: HM Diabetic Foot Exam: NORMAL

## 2014-03-12 LAB — PSA, MEDICARE: PSA: 1.58 ng/ml (ref 0.10–4.00)

## 2014-03-12 LAB — LDL CHOLESTEROL, DIRECT: Direct LDL: 49.9 mg/dL

## 2014-03-12 MED ORDER — METFORMIN HCL 500 MG PO TABS: 500.0000 mg | ORAL_TABLET | Freq: Two times a day (BID) | ORAL | Status: DC

## 2014-03-12 MED ORDER — ZOSTER VACCINE LIVE 19400 UNT/0.65ML ~~LOC~~ SOLR: 0.6500 mL | Freq: Once | SUBCUTANEOUS | Status: DC

## 2014-03-12 NOTE — Progress Notes (Signed)
Patient ID: Stephen Kelly, male   DOB: 10/08/1940, 73 y.o.   MRN: [id number] The patient is here for annual Medicare wellness examination and management of other chronic and acute problems.  STILL HAVING SLIGHT TRIGGER FINGER ISSUES BILATERALLY.  Does not want to see an orthopedist at this time since neither ring finger is getting stuck.  Managing his diabetes with dietary restrictions and regular exercise.  Had fasting labs done last Friday These were reviewed today as well.       The risk factors are reflected in the social history.  The roster of all physicians providing medical care to patient - is listed in the Snapshot section of the chart.  Activities of daily living:  The patient is 100% independent in all ADLs: dressing, toileting, feeding as well as independent mobility  Home safety : The patient has smoke detectors in the home. They wear seatbelts.  There are no firearms at home. There is no violence in the home.   There is no risks for hepatitis, STDs or HIV. There is no   history of blood transfusion. They have no travel history to infectious disease endemic areas of the world.  The patient has seen their dentist in the last six month. They have seen their eye doctor in the last year. They admit to slight hearing difficulty with regard to whispered voices and some television programs.  They have deferred audiologic testing in the last year.  They do not  have excessive sun exposure. Discussed the need for sun protection: hats, long sleeves and use of sunscreen if there is significant sun exposure.   Diet: the importance of a healthy diet is discussed. They do have a healthy diet.  The benefits of regular aerobic exercise were discussed. She walks 4 times per week ,  20 minutes.   Depression screen: there are no signs or vegative symptoms of depression- irritability, change in appetite, anhedonia, sadness/tearfullness.  Cognitive assessment: the patient manages all their  financial and personal affairs and is actively engaged. They could relate day,date,year and events; recalled 2/3 objects at 3 minutes; performed clock-face test normally.  The following portions of the patient's history were reviewed and updated as appropriate: allergies, current medications, past family history, past medical history,  past surgical history, past social history  and problem list.  Visual acuity was not assessed per patient preference since she has regular follow up with her ophthalmologist. Hearing and body mass index were assessed and reviewed.   During the course of the visit the patient was educated and counseled about appropriate screening and preventive services including : fall prevention , diabetes screening, nutrition counseling, colorectal cancer screening, and recommended immunizations.    Objective:  BP 158/82  Pulse 80  Temp(Src) 97.6 F (36.4 C) (Oral)  Resp 16  Ht 5\' 9"  (1.753 m)  Wt 206 lb 4 oz (93.554 kg)  BMI 30.44 kg/m2  SpO2 94%  General Appearance:    Alert, cooperative, no distress, appears stated age  Head:    Normocephalic, without obvious abnormality, atraumatic  Eyes:    PERRL, conjunctiva/corneas clear, EOM's intact, fundi    benign, both eyes       Ears:    Normal TM's and external ear canals, both ears  Nose:   Nares normal, septum midline, mucosa normal, no drainage   or sinus tenderness  Throat:   Lips, mucosa, and tongue normal; teeth and gums normal  Neck:   Supple, symmetrical, trachea midline, no adenopathy;  thyroid:  No enlargement/tenderness/nodules; no carotid   bruit or JVD  Back:     Symmetric, no curvature, ROM normal, no CVA tenderness  Lungs:     Clear to auscultation bilaterally, respirations unlabored  Chest wall:    No tenderness or deformity  Heart:    Regular rate and rhythm, S1 and S2 normal, no murmur, rub   or gallop  Abdomen:     Soft, non-tender, bowel sounds active all four quadrants,    no masses, no  organomegaly  Genitalia:    Normal male without lesion, discharge or tenderness  Rectal:    Normal tone, normal prostate, no masses or tenderness;   guaiac negative stool  Extremities:   Extremities normal, atraumatic, no cyanosis or edema  Pulses:   2+ and symmetric all extremities  Skin:   Skin color, texture, turgor normal, no rashes or lesions  Lymph nodes:   Cervical, supraclavicular, and axillary nodes normal  Neurologic:   CNII-XII intact. Normal strength, sensation and reflexes      throughout   Assessment and Plan:   Hypertension Well controlled on current regimen. Renal function stable, no changes today.  Lab Results  Component Value Date   CREATININE 1.2 03/09/2014   Lab Results  Component Value Date   MICROALBUR 24.1* 03/12/2014   Lab Results  Component Value Date   NA 140 03/09/2014   K 4.0 03/09/2014   CL 108 03/09/2014   CO2 21 03/09/2014     Controlled type 2 diabetes mellitus with microalbuminuria or microproteinuria diabetes is still under good control on current regimen, but  a1c has risen slightly.  Discussed starting low dose metformin for  Lab Results  Component Value Date   HGBA1C 7.4* 03/09/2014   . Lab Results  Component Value Date   MICROALBUR 24.1* 03/12/2014     Asked to  return in 3 months and have fasting labs done prior to your visit.      Hyperlipidemia associated with type 2 diabetes mellitus Atorvastatin was prescribed in July for elevated LDL in setting of Type 2 DM and hypertension. He is tolerating therapy, no changes today,   Lab Results  Component Value Date   CHOL 111 03/09/2014   HDL 21.50* 03/09/2014   LDLCALC 50 02/07/2014   LDLDIRECT 49.9 03/09/2014   TRIG 213.0* 03/09/2014   CHOLHDL 5 03/09/2014   Lab Results  Component Value Date   ALT 19 03/09/2014   AST 17 03/09/2014   ALKPHOS 55 03/09/2014   BILITOT 0.7 03/09/2014     Medicare annual wellness visit, subsequent Annual Medicare wellness  exam was  done as well as a comprehensive physical exam and management of acute and chronic conditions .  During the course of the visit the patient was educated and counseled about appropriate screening and preventive services including : fall prevention , diabetes screening, nutrition counseling, colorectal cancer screening, and recommended immunizations.  Printed recommendations for health maintenance screenings was given.    Updated Medication List Outpatient Encounter Prescriptions as of 03/12/2014  Medication Sig  . allopurinol (ZYLOPRIM) 100 MG tablet TAKE 1 TABLET (100 MG TOTAL) BY MOUTH DAILY.  [name] atorvastatin (LIPITOR) 20 MG tablet Take 1 tablet (20 mg total) by mouth daily.  . fluocinonide-emollient (LIDEX-E) 0.05 % cream Apply 1 application topically 2 (two) times daily.  [name] guaiFENesin-codeine (ROBITUSSIN AC) 100-10 MG/5ML syrup Take 5 mLs by mouth 3 (three) times daily as needed for cough.  . losartan (COZAAR) 100 MG tablet TAKE  1 TABLET (100 MG TOTAL) BY MOUTH DAILY.  [name]. omeprazole (PRILOSEC OTC) 20 MG tablet Take 20 mg by mouth daily.  . traMADol (ULTRAM) 50 MG tablet Take 1 tablet (50 mg total) by mouth every 8 (eight) hours as needed.  . warfarin (COUMADIN) 2 MG tablet 2 on Tuesday,  2 on Friday ,  1 all other days  . metFORMIN (GLUCOPHAGE) 500 MG tablet Take 1 tablet (500 mg total) by mouth 2 (two) times daily with a meal.  . zoster vaccine live, PF, (ZOSTAVAX) [id number] UNT/0.65ML injection Inject 19,400 Units into the skin once.  . zoster vaccine live, PF, (ZOSTAVAX) [id number] UNT/0.65ML injection Inject 19,400 Units into the skin once.

## 2014-03-12 NOTE — Patient Instructions (Signed)
Your diabetes control is slipping a little, in spite of you following a pretty good diet.  I am recommending we start you on metformin twice daily to prevent your control from getting any worse.  Take it twice daily with breakfast and dinner  You had your annual Medicare wellness exam today  You received the influenza vaccine today.  We will contact you with the bloodwork results to determine if it is safe for you to have the Shingles vaccine  Health Maintenance A healthy lifestyle and preventative care can promote health and wellness.  Maintain regular health, dental, and eye exams.  Eat a healthy diet. Foods like vegetables, fruits, whole grains, low-fat dairy products, and lean protein foods contain the nutrients you need and are low in calories. Decrease your intake of foods high in solid fats, added sugars, and salt. Get information about a proper diet from your health care provider, if necessary.  Regular physical exercise is one of the most important things you can do for your health. Most adults should get at least 150 minutes of moderate-intensity exercise (any activity that increases your heart rate and causes you to sweat) each week. In addition, most adults need muscle-strengthening exercises on 2 or more days a week.   Maintain a healthy weight. The body mass index (BMI) is a screening tool to identify possible weight problems. It provides an estimate of body fat based on height and weight. Your health care provider can find your BMI and can help you achieve or maintain a healthy weight. For males 20 years and older:  A BMI below 18.5 is considered underweight.  A BMI of 18.5 to 24.9 is normal.  A BMI of 25 to 29.9 is considered overweight.  A BMI of 30 and above is considered obese.  Maintain normal blood lipids and cholesterol by exercising and minimizing your intake of saturated fat. Eat a balanced diet with plenty of fruits and vegetables. Blood tests for lipids and  cholesterol should begin at age 105 and be repeated every 5 years. If your lipid or cholesterol levels are high, you are over age 29, or you are at high risk for heart disease, you may need your cholesterol levels checked more frequently.Ongoing high lipid and cholesterol levels should be treated with medicines if diet and exercise are not working.  If you smoke, find out from your health care provider how to quit. If you do not use tobacco, do not start.  Lung cancer screening is recommended for adults aged 55-80 years who are at high risk for developing lung cancer because of a history of smoking. A yearly low-dose CT scan of the lungs is recommended for people who have at least a 30-pack-year history of smoking and are current smokers or have quit within the past 15 years. A pack year of smoking is smoking an average of 1 pack of cigarettes a day for 1 year (for example, a 30-pack-year history of smoking could mean smoking 1 pack a day for 30 years or 2 packs a day for 15 years). Yearly screening should continue until the smoker has stopped smoking for at least 15 years. Yearly screening should be stopped for people who develop a health problem that would prevent them from having lung cancer treatment.  If you choose to drink alcohol, do not have more than 2 drinks per day. One drink is considered to be 12 oz (360 mL) of beer, 5 oz (150 mL) of wine, or 1.5 oz (45 mL)  of liquor.  Avoid the use of street drugs. Do not share needles with anyone. Ask for help if you need support or instructions about stopping the use of drugs.  High blood pressure causes heart disease and increases the risk of stroke. Blood pressure should be checked at least every 1-2 years. Ongoing high blood pressure should be treated with medicines if weight loss and exercise are not effective.  If you are 4045-72 years old, ask your health care provider if you should take aspirin to prevent heart disease.  Diabetes screening involves  taking a blood sample to check your fasting blood sugar level. This should be done once every 3 years after age 72 if you are at a normal weight and without risk factors for diabetes. Testing should be considered at a younger age or be carried out more frequently if you are overweight and have at least 1 risk factor for diabetes.  Colorectal cancer can be detected and often prevented. Most routine colorectal cancer screening begins at the age of 72 and continues through age 72. However, your health care provider may recommend screening at an earlier age if you have risk factors for colon cancer. On a yearly basis, your health care provider may provide home test kits to check for hidden blood in the stool. A small camera at the end of a tube may be used to directly examine the colon (sigmoidoscopy or colonoscopy) to detect the earliest forms of colorectal cancer. Talk to your health care provider about this at age 72 when routine screening begins. A direct exam of the colon should be repeated every 5-10 years through age 72, unless early forms of precancerous polyps or small growths are found.  People who are at an increased risk for hepatitis B should be screened for this virus. You are considered at high risk for hepatitis B if:  You were born in a country where hepatitis B occurs often. Talk with your health care provider about which countries are considered high risk.  Your parents were born in a high-risk country and you have not received a shot to protect against hepatitis B (hepatitis B vaccine).  You have HIV or AIDS.  You use needles to inject street drugs.  You live with, or have sex with, someone who has hepatitis B.  You are a man who has sex with other men (MSM).  You get hemodialysis treatment.  You take certain medicines for conditions like cancer, organ transplantation, and autoimmune conditions.  Hepatitis C blood testing is recommended for all people born from [id number] through 1964  and any individual with known risk factors for hepatitis C.  Healthy men should no longer receive prostate-specific antigen (PSA) blood tests as part of routine cancer screening. Talk to your health care provider about prostate cancer screening.  Testicular cancer screening is not recommended for adolescents or adult males who have no symptoms. Screening includes self-exam, a health care provider exam, and other screening tests. Consult with your health care provider about any symptoms you have or any concerns you have about testicular cancer.  Practice safe sex. Use condoms and avoid high-risk sexual practices to reduce the spread of sexually transmitted infections (STIs).  You should be screened for STIs, including gonorrhea and chlamydia if:  You are sexually active and are younger than 24 years.  You are older than 24 years, and your health care provider tells you that you are at risk for this type of infection.  Your sexual activity has  changed since you were last screened, and you are at an increased risk for chlamydia or gonorrhea. Ask your health care provider if you are at risk.  If you are at risk of being infected with HIV, it is recommended that you take a prescription medicine daily to prevent HIV infection. This is called pre-exposure prophylaxis (PrEP). You are considered at risk if:  You are a man who has sex with other men (MSM).  You are a heterosexual man who is sexually active with multiple partners.  You take drugs by injection.  You are sexually active with a partner who has HIV.  Talk with your health care provider about whether you are at high risk of being infected with HIV. If you choose to begin PrEP, you should first be tested for HIV. You should then be tested every 3 months for as long as you are taking PrEP.  Use sunscreen. Apply sunscreen liberally and repeatedly throughout the day. You should seek shade when your shadow is shorter than you. Protect  yourself by wearing long sleeves, pants, a wide-brimmed hat, and sunglasses year round whenever you are outdoors.  Tell your health care provider of new moles or changes in moles, especially if there is a change in shape or color. Also, tell your health care provider if a mole is larger than the size of a pencil eraser.  A one-time screening for abdominal aortic aneurysm (AAA) and surgical repair of large AAAs by ultrasound is recommended for men aged 65-75 years who are current or former smokers.  Stay current with your vaccines (immunizations). Document Released: 11/07/2007 Document Revised: 05/16/2013 Document Reviewed: 10/06/2010 [facility] Patient Information 2015 [employer] [place]. This information is not intended to replace advice given to you by your health care provider. Make sure you discuss any questions you have with your health care provider.

## 2014-03-12 NOTE — Progress Notes (Signed)
Pre-visit discussion using our clinic review tool. No additional management support is needed unless otherwise documented below in the visit note.  

## 2014-03-13 DIAGNOSIS — Z299 Encounter for prophylactic measures, unspecified: Secondary | ICD-10-CM | POA: Insufficient documentation

## 2014-03-13 LAB — VARICELLA ZOSTER ABS, IGG/IGM
Varicella IgM: <0.91 index (ref 0.00–0.90)
Varicella zoster IgG: 889 index (ref >165)

## 2014-03-13 NOTE — Assessment & Plan Note (Signed)

## 2014-03-13 NOTE — Assessment & Plan Note (Signed)
Well controlled on current regimen. Renal function stable, no changes today.  Lab Results  Component Value Date   CREATININE 1.2 03/09/2014   Lab Results  Component Value Date   MICROALBUR 24.1* 03/12/2014   Lab Results  Component Value Date   NA 140 03/09/2014   K 4.0 03/09/2014   CL 108 03/09/2014   CO2 21 03/09/2014

## 2014-03-13 NOTE — Assessment & Plan Note (Signed)
diabetes is still under good control on current regimen, but  a1c has risen slightly.  Discussed starting low dose metformin for  Lab Results  Component Value Date   HGBA1C 7.4* 03/09/2014   . Lab Results  Component Value Date   MICROALBUR 24.1* 03/12/2014     Asked to  return in 3 months and have fasting labs done prior to your visit.

## 2014-03-13 NOTE — Assessment & Plan Note (Signed)
Atorvastatin was prescribed in July for elevated LDL in setting of Type 2 DM and hypertension. He is tolerating therapy, no changes today,   Lab Results  Component Value Date   CHOL 111 03/09/2014   HDL 21.50* 03/09/2014   LDLCALC 50 02/07/2014   LDLDIRECT 49.9 03/09/2014   TRIG 213.0* 03/09/2014   CHOLHDL 5 03/09/2014   Lab Results  Component Value Date   ALT 19 03/09/2014   AST 17 03/09/2014   ALKPHOS 55 03/09/2014   BILITOT 0.7 03/09/2014

## 2014-03-14 ENCOUNTER — Encounter: Payer: Self-pay | Admitting: *Deleted

## 2014-06-04 ENCOUNTER — Other Ambulatory Visit: Payer: Self-pay | Admitting: Internal Medicine

## 2014-06-11 ENCOUNTER — Ambulatory Visit (INDEPENDENT_AMBULATORY_CARE_PROVIDER_SITE_OTHER)
Admission: RE | Admit: 2014-06-11 | Discharge: 2014-06-11 | Disposition: A | Discharge status: Home / Self Care | Payer: Medicare Other | Source: Ambulatory Visit | Attending: Internal Medicine | Admitting: Internal Medicine

## 2014-06-11 ENCOUNTER — Encounter: Payer: Self-pay | Admitting: Internal Medicine

## 2014-06-11 ENCOUNTER — Ambulatory Visit
Admission: RE | Admit: 2014-06-11 | Discharge: 2014-06-11 | Disposition: A | Discharge status: Home / Self Care | Payer: Medicare Other | Source: Ambulatory Visit | Attending: Internal Medicine | Admitting: Internal Medicine

## 2014-06-11 ENCOUNTER — Ambulatory Visit (INDEPENDENT_AMBULATORY_CARE_PROVIDER_SITE_OTHER): Payer: Medicare Other | Admitting: Internal Medicine

## 2014-06-11 VITALS — BP 140/98 | HR 82 | Temp 97.7°F | Resp 14 | Ht 69.0 in | Wt 203.8 lb

## 2014-06-11 DIAGNOSIS — Z8639 Personal history of other endocrine, nutritional and metabolic disease: Secondary | ICD-10-CM

## 2014-06-11 DIAGNOSIS — G4733 Obstructive sleep apnea (adult) (pediatric): Secondary | ICD-10-CM

## 2014-06-11 DIAGNOSIS — M130 Polyarthritis, unspecified: Secondary | ICD-10-CM

## 2014-06-11 DIAGNOSIS — E119 Type 2 diabetes mellitus without complications: Secondary | ICD-10-CM

## 2014-06-11 DIAGNOSIS — E785 Hyperlipidemia, unspecified: Secondary | ICD-10-CM

## 2014-06-11 DIAGNOSIS — M064 Inflammatory polyarthropathy: Secondary | ICD-10-CM

## 2014-06-11 DIAGNOSIS — M109 Gout, unspecified: Secondary | ICD-10-CM

## 2014-06-11 DIAGNOSIS — IMO0001 Reserved for inherently not codable concepts without codable children: Secondary | ICD-10-CM

## 2014-06-11 DIAGNOSIS — M1009 Idiopathic gout, multiple sites: Secondary | ICD-10-CM

## 2014-06-11 DIAGNOSIS — Z8739 Personal history of other diseases of the musculoskeletal system and connective tissue: Secondary | ICD-10-CM

## 2014-06-11 DIAGNOSIS — I10 Essential (primary) hypertension: Secondary | ICD-10-CM

## 2014-06-11 DIAGNOSIS — R809 Proteinuria, unspecified: Secondary | ICD-10-CM

## 2014-06-11 DIAGNOSIS — Z7901 Long term (current) use of anticoagulants: Secondary | ICD-10-CM

## 2014-06-11 LAB — CBC WITH DIFFERENTIAL/PLATELET
Basophils Absolute: 0 10*3/uL (ref 0.0–0.1)
Basophils Relative: 0.4 % (ref 0.0–3.0)
Eosinophils Absolute: 0.4 10*3/uL (ref 0.0–0.7)
Eosinophils Relative: 3.9 % (ref 0.0–5.0)
HCT: 42.2 % (ref 39.0–52.0)
Hemoglobin: 13.9 g/dL (ref 13.0–17.0)
Lymphocytes Relative: 23.7 % (ref 12.0–46.0)
Lymphs Abs: 2.7 10*3/uL (ref 0.7–4.0)
MCHC: 33 g/dL (ref 30.0–36.0)
MCV: 84.2 fl (ref 78.0–100.0)
Monocytes Absolute: 0.8 10*3/uL (ref 0.1–1.0)
Monocytes Relative: 7.1 % (ref 3.0–12.0)
Neutro Abs: 7.3 10*3/uL (ref 1.4–7.7)
Neutrophils Relative %: 64.9 % (ref 43.0–77.0)
Platelets: 285 10*3/uL (ref 150.0–400.0)
RBC: 5.01 Mil/uL (ref 4.22–5.81)
RDW: 15.1 % (ref 11.5–15.5)
WBC: 11.2 10*3/uL — ABNORMAL HIGH (ref 4.0–10.5)

## 2014-06-11 LAB — LIPID PANEL
Cholesterol: 112 mg/dL (ref 0–200)
HDL: 30.9 mg/dL — ABNORMAL LOW (ref >39.00)
LDL Cholesterol: 44 mg/dL (ref 0–99)
NonHDL: 81.1
Total CHOL/HDL Ratio: 4
Triglycerides: 185 mg/dL — ABNORMAL HIGH (ref 0.0–149.0)
VLDL: 37 mg/dL (ref 0.0–40.0)

## 2014-06-11 LAB — COMPREHENSIVE METABOLIC PANEL
ALT: 13 U/L (ref 0–53)
AST: 14 U/L (ref 0–37)
Albumin: 4 g/dL (ref 3.5–5.2)
Alkaline Phosphatase: 56 U/L (ref 39–117)
BUN: 30 mg/dL — ABNORMAL HIGH (ref 6–23)
CO2: 28 mEq/L (ref 19–32)
Calcium: 9.6 mg/dL (ref 8.4–10.5)
Chloride: 104 mEq/L (ref 96–112)
Creatinine, Ser: 1.1 mg/dL (ref 0.40–1.50)
GFR: 69.77 mL/min (ref >60.00)
Glucose, Bld: 128 mg/dL — ABNORMAL HIGH (ref 70–99)
Potassium: 4.4 mEq/L (ref 3.5–5.1)
Sodium: 139 mEq/L (ref 135–145)
Total Bilirubin: 0.6 mg/dL (ref 0.2–1.2)
Total Protein: 7.1 g/dL (ref 6.0–8.3)

## 2014-06-11 LAB — URIC ACID: Uric Acid, Serum: 5.5 mg/dL (ref 4.0–7.8)

## 2014-06-11 LAB — HEMOGLOBIN A1C: Hgb A1c MFr Bld: 7.3 % — ABNORMAL HIGH (ref 4.6–6.5)

## 2014-06-11 LAB — C-REACTIVE PROTEIN: CRP: 0.3 mg/dL — ABNORMAL LOW (ref 0.5–20.0)

## 2014-06-11 LAB — SEDIMENTATION RATE: Sed Rate: 26 mm/hr — ABNORMAL HIGH (ref 0–22)

## 2014-06-11 LAB — RHEUMATOID FACTOR: Rhuematoid fact SerPl-aCnc: <10 IU/mL (ref <=14)

## 2014-06-11 NOTE — Progress Notes (Signed)
Patient ID: Stephen Kelly, male   DOB: 1941-02-26, 74 y.o.   MRN: [id number]   Patient Active Problem List   Diagnosis Date Noted  . Medicare annual wellness visit, subsequent 03/13/2014  . Hyperlipidemia   . Polyarthritis of multiple sites 02/05/2014  . Long term current use of anticoagulant therapy 12/08/2013  . Encounter for initial preventive physical examination covered by [facility] 03/09/2013  . Hyperlipidemia associated with type 2 diabetes mellitus 03/03/2012  . Controlled type 2 diabetes mellitus with microalbuminuria or microproteinuria 08/31/2011  . Radial artery thrombosis, right 08/31/2011  . Actinic keratosis of left side of forehead 08/31/2011  . Patent foramen ovale   . Lupus anticoagulant inhibitor syndrome   . Hypertension 04/23/2011  . Screening for colon cancer 04/22/2011  . Sleep apnea, obstructive 04/22/2011  . Clotting disorder   . History of renal calculi   . Gout, arthritis   . Pulmonary nodule, right     Subjective:  CC:   Chief Complaint  Patient presents with  . Follow-up  . Diabetes    HPI:   Stephen Kelly is a 74 y.o. male who presents for   Follow up on Type 2 DM, with proteinuria, dyslipidemia, and  Hypertension, and evaluation of subacute development of joint pain. He reports that on Dec 28th he woke up with  Pain involving f bilateral hands, palms wrists, shoulders and knees  Which lasted about 2 days, before resolving incompletely.  No prior history of overuse or of viral illness or sore throat. .  Still having hand pain and wrist pain on the left side only and unable to close either fist because of swollen stiff fingers .  denied  Having any fevers, nausea,  shortness of breath.  No joint redness or tenderness .  He is worried about cancer, since wife has multiple myeloma that presented with back pain .  DM: Doesn't check sugars.  Remains physicially active,  Follows a sensible diet.  Still mows grass, but has reduced load to 2 yards  per week.   Reviewed last post OV labs and medications  History:  In September he scheduled an acute visit to discuss subacute development of  Fatigue, muscle and joint pain and stiffness.  Patient states that after mowing a lawn l, which took several hours with a push mower,  He felt completely fatigued an unable to continue any exertional activity, despite hydrating with water (34 ounces total).  He denies chest pain and dyspnea, but noted that both sides of his lateral chest wall (points to pectoralis muscles on both sides of chest) were tender to touch for days, along with the backs of both legs.  Additionally, for the past 3 weeks he has noted weakness of both hands, decreased grip strength   And bilateral swelling of fingers, to the point that he cannot close his fist.  He denies any new medications, but did start atorvastatin for hyperlipidemia in mid July.    Past Medical History  Diagnosis Date  . Hypertension   . History of renal calculi 2001    history of lithotripsy ,  no stent  . Gout, arthritis     managed with allopurinol  . Pulmonary nodule, right 2011    unchanged, followed by gitten with serial ct  . Patent foramen ovale   . Lupus anticoagulant inhibitor syndrome     per gitten's test March 2012  . Clotting disorder 2011    Lupus Inhibitor positive by March 2012 studies  .  Hyperlipidemia     Past Surgical History  Procedure Laterality Date  . Thrombectomy      left brachicephalic artery       The following portions of the patient's history were reviewed and updated as appropriate: Allergies, current medications, and problem list.    Review of Systems:   Patient denies headache, fevers, malaise, unintentional weight loss, skin rash, eye pain, sinus congestion and sinus pain, sore throat, dysphagia,  hemoptysis , cough, dyspnea, wheezing, chest pain, palpitations, orthopnea, edema, abdominal pain, nausea, melena, diarrhea, constipation, flank pain, dysuria,  hematuria, urinary  Frequency, nocturia, numbness, tingling, seizures,  Focal weakness, Loss of consciousness,  Tremor, insomnia, depression, anxiety, and suicidal ideation.     History   Social History  . Marital Status: Married    Spouse Name: N/A    Number of Children: N/A  . Years of Education: N/A   Occupational History  . Not on file.   Social History Main Topics  . Smoking status: Never Smoker   . Smokeless tobacco: Never Used  . Alcohol Use: No  . Drug Use: No  . Sexual Activity: Not on file   Other Topics Concern  . Not on file   Social History Narrative    Objective:  Filed Vitals:   06/11/14 1010  BP: 140/98  Pulse: 82  Temp: 97.7 F (36.5 C)  Resp: 14     General appearance: alert, cooperative and appears stated age Ears: normal TM's and external ear canals both ears Throat: lips, mucosa, and tongue normal; teeth and gums normal Neck: no adenopathy, no carotid bruit, supple, symmetrical, trachea midline and thyroid not enlarged, symmetric, no tenderness/mass/nodules Back: symmetric, no curvature. ROM normal. No CVA tenderness. Lungs: clear to auscultation bilaterally Heart: regular rate and rhythm, S1, S2 normal, no murmur, click, rub or gallop Abdomen: soft, non-tender; bowel sounds normal; no masses,  no organomegaly Pulses: 2+ and symmetric Skin: Skin color, texture, turgor normal. No rashes or lesions MSK: bilateral synovitis of PIPs, wrists. Fingers sausage shaped, unable to flex to palm  Lymph nodes: Cervical, supraclavicular, and axillary nodes normal.  Assessment and Plan:  Polyarthritis of multiple sites He has synovitis of PIPs bilaterally today and digits suggestive of psoriatic arhtritis.  Serologies for inflammatory arthritidies have been collected and referral to Dr Jefm Bryant will be made    Sleep apnea, obstructive Symptoms have resolved with significant weight loss and he is no longer using CPAP    Hypertension BP 140/98 mmHg   Pulse 82  Temp(Src) 97.7 F (36.5 C) (Oral)  Resp 14  Ht $R'5\' 9"'lw$  (1.753 m)  Wt 203 lb 12 oz (92.42 kg)  BMI 30.07 kg/m2  SpO2 95%  BP has been persistently above goal on losartan alone.  Adding hctz today   Lab Results  Component Value Date   CREATININE 1.10 06/11/2014   Lab Results  Component Value Date   NA 139 06/11/2014   K 4.4 06/11/2014   CL 104 06/11/2014   CO2 28 06/11/2014       Gout, arthritis Last uric acid level was 7.0.  Rechecking today,  Will increase allopurinol dose for level > 6.0  No results found for: URICACID    Controlled type 2 diabetes mellitus with microalbuminuria or microproteinuria diabetes  Control has improved slightly with low dose metformin; Aic down from 7.4 to 7.3  But not at goal yet.  will increase dose to 850 mg bid   Lab Results  Component Value Date  HGBA1C 7.3* 06/11/2014  . Lab Results  Component Value Date   MICROALBUR 24.1* 03/12/2014     Asked to  return in 3 months and have fasting labs done prior to visit.         Hyperlipidemia LDL is now at goal since atorvastatin was prescribed in July for elevated LDL in setting of Type 2 DM and hypertension. LFTs are normal and he is tolerating therapy, but is having more episodes of polyarthritis which is under workup .  no changes today,   Lab Results  Component Value Date   CHOL 112 06/11/2014   HDL 30.90* 06/11/2014   LDLCALC 44 06/11/2014   LDLDIRECT 49.9 03/09/2014   TRIG 185.0* 06/11/2014   CHOLHDL 4 06/11/2014   Lab Results  Component Value Date   ALT 13 06/11/2014   AST 14 06/11/2014   ALKPHOS 56 06/11/2014   BILITOT 0.6 06/11/2014        Long term current use of anticoagulant therapy INR has been historically  therapeutic on stable doses of warfarin, but due to medication changes today will need to repeat INR in 2 weeks .  Lab Results  Component Value Date   INR 2.3* 03/12/2014   INR 3.0* 12/06/2013   INR 2.5* 09/01/2013         A  total of 40 minutes was spent with patient more than half of which was spent in counseling patient on the above mentioned issues , reviewing and explaining recent labs and imaging studies done, and coordination of care.  Updated Medication List Outpatient Encounter Prescriptions as of 06/11/2014  Medication Sig  . allopurinol (ZYLOPRIM) 100 MG tablet TAKE 1 TABLET (100 MG TOTAL) BY MOUTH DAILY.  [name] atorvastatin (LIPITOR) 20 MG tablet Take 1 tablet (20 mg total) by mouth daily.  . fluocinonide-emollient (LIDEX-E) 0.05 % cream Apply 1 application topically 2 (two) times daily.  [name] guaiFENesin-codeine (ROBITUSSIN AC) 100-10 MG/5ML syrup Take 5 mLs by mouth 3 (three) times daily as needed for cough.  . losartan (COZAAR) 100 MG tablet TAKE 1 TABLET (100 MG TOTAL) BY MOUTH DAILY.  . metFORMIN (GLUCOPHAGE) 850 MG tablet Take 1 tablet (850 mg total) by mouth 2 (two) times daily with a meal.  . naproxen sodium (ANAPROX) 220 MG tablet Take 220 mg by mouth 2 (two) times daily with a meal.  . omeprazole (PRILOSEC OTC) 20 MG tablet Take 20 mg by mouth daily.  [name] warfarin (COUMADIN) 2 MG tablet 2 on Tuesday,  2 on Friday ,  1 all other days  . [DISCONTINUED] metFORMIN (GLUCOPHAGE) 500 MG tablet Take 1 tablet (500 mg total) by mouth 2 (two) times daily with a meal.  . hydrochlorothiazide (HYDRODIURIL) 25 MG tablet Take 1 tablet (25 mg total) by mouth daily.  . traMADol (ULTRAM) 50 MG tablet Take 1 tablet (50 mg total) by mouth every 8 (eight) hours as needed. (Patient not taking: Reported on 06/11/2014)  . [DISCONTINUED] zoster vaccine live, PF, (ZOSTAVAX) 40973 UNT/0.65ML injection Inject 19,400 Units into the skin once.  . [DISCONTINUED] zoster vaccine live, PF, (ZOSTAVAX) 53299 UNT/0.65ML injection Inject 19,400 Units into the skin once.     Orders Placed This Encounter  Procedures  . DG Hand Complete Left  . DG Hand Complete Right  . Hemoglobin A1c  . Comprehensive metabolic panel  . Lipid panel  . CBC  with Differential  . Sedimentation rate  . Uric acid  . Rheumatoid factor  . ANA w/Reflex if Positive  .  HLA-B27 antigen  . C-reactive protein  . Cyclic citrul peptide antibody, IgG  . Ambulatory referral to Rheumatology    Return in about 3 months (around 09/10/2014) for follow up diabetes.

## 2014-06-11 NOTE — Patient Instructions (Signed)
I am checking additional blood work and x rays of your hands to evaluate your arthritis  Arthritis, Nonspecific Arthritis is inflammation of a joint. This usually means pain, redness, warmth or swelling are present. One or more joints may be involved. There are a number of types of arthritis. Your caregiver may not be able to tell what type of arthritis you have right away. CAUSES  The most common cause of arthritis is the wear and tear on the joint (osteoarthritis). This causes damage to the cartilage, which can break down over time. The knees, hips, back and neck are most often affected by this type of arthritis. Other types of arthritis and common causes of joint pain include:  Sprains and other injuries near the joint. Sometimes minor sprains and injuries cause pain and swelling that develop hours later.  Rheumatoid arthritis. This affects hands, feet and knees. It usually affects both sides of your body at the same time. It is often associated with chronic ailments, fever, weight loss and general weakness.  Crystal arthritis. Gout and pseudo gout can cause occasional acute severe pain, redness and swelling in the foot, ankle, or knee.  Infectious arthritis. Bacteria can get into a joint through a break in overlying skin. This can cause infection of the joint. Bacteria and viruses can also spread through the blood and affect your joints.  Drug, infectious and allergy reactions. Sometimes joints can become mildly painful and slightly swollen with these types of illnesses. SYMPTOMS   Pain is the main symptom.  Your joint or joints can also be red, swollen and warm or hot to the touch.  You may have a fever with certain types of arthritis, or even feel overall ill.  The joint with arthritis will hurt with movement. Stiffness is present with some types of arthritis. DIAGNOSIS  Your caregiver will suspect arthritis based on your description of your symptoms and on your exam. Testing may be  needed to find the type of arthritis:  Blood and sometimes urine tests.  X-ray tests and sometimes CT or MRI scans.  Removal of fluid from the joint (arthrocentesis) is done to check for bacteria, crystals or other causes. Your caregiver (or a specialist) will numb the area over the joint with a local anesthetic, and use a needle to remove joint fluid for examination. This procedure is only minimally uncomfortable.  Even with these tests, your caregiver may not be able to tell what kind of arthritis you have. Consultation with a specialist (rheumatologist) may be helpful. TREATMENT  Your caregiver will discuss with you treatment specific to your type of arthritis. If the specific type cannot be determined, then the following general recommendations may apply. Treatment of severe joint pain includes:  Rest.  Elevation.  Anti-inflammatory medication (for example, ibuprofen) may be prescribed. Avoiding activities that cause increased pain.  Only take over-the-counter or prescription medicines for pain and discomfort as recommended by your caregiver.  Cold packs over an inflamed joint may be used for 10 to 15 minutes every hour. Hot packs sometimes feel better, but do not use overnight. Do not use hot packs if you are diabetic without your caregiver's permission.  A cortisone shot into arthritic joints may help reduce pain and swelling.  Any acute arthritis that gets worse over the next 1 to 2 days needs to be looked at to be sure there is no joint infection. Long-term arthritis treatment involves modifying activities and lifestyle to reduce joint stress jarring. This can include weight loss.  Also, exercise is needed to nourish the joint cartilage and remove waste. This helps keep the muscles around the joint strong. HOME CARE INSTRUCTIONS   Do not take aspirin to relieve pain if gout is suspected. This elevates uric acid levels.  Only take over-the-counter or prescription medicines for  pain, discomfort or fever as directed by your caregiver.  Rest the joint as much as possible.  If your joint is swollen, keep it elevated.  Use crutches if the painful joint is in your leg.  Drinking plenty of fluids may help for certain types of arthritis.  Follow your caregiver's dietary instructions.  Try low-impact exercise such as:  Swimming.  Water aerobics.  Biking.  Walking.  Morning stiffness is often relieved by a warm shower.  Put your joints through regular range-of-motion. SEEK MEDICAL CARE IF:   You do not feel better in 24 hours or are getting worse.  You have side effects to medications, or are not getting better with treatment. SEEK IMMEDIATE MEDICAL CARE IF:   You have a fever.  You develop severe joint pain, swelling or redness.  Many joints are involved and become painful and swollen.  There is severe back pain and/or leg weakness.  You have loss of bowel or bladder control. Document Released: 06/18/2004 Document Revised: 08/03/2011 Document Reviewed: 07/04/2008 [facility] Patient Information 2015 [employer] [place]. This information is not intended to replace advice given to you by your health care provider. Make sure you discuss any questions you have with your health care provider.

## 2014-06-11 NOTE — Progress Notes (Signed)
Pre-visit discussion using our clinic review tool. No additional management support is needed unless otherwise documented below in the visit note.  

## 2014-06-12 ENCOUNTER — Encounter: Payer: Self-pay | Admitting: *Deleted

## 2014-06-12 ENCOUNTER — Encounter: Payer: Self-pay | Admitting: Internal Medicine

## 2014-06-12 LAB — HLA-B27 ANTIGEN: DNA Result:: NOT DETECTED

## 2014-06-12 MED ORDER — METFORMIN HCL 850 MG PO TABS: 850.0000 mg | ORAL_TABLET | Freq: Two times a day (BID) | ORAL | Status: DC

## 2014-06-12 MED ORDER — HYDROCHLOROTHIAZIDE 25 MG PO TABS: 25.0000 mg | ORAL_TABLET | Freq: Every day | ORAL | Status: DC

## 2014-06-12 NOTE — Assessment & Plan Note (Signed)
diabetes  Control has improved slightly with low dose metformin; Aic down from 7.4 to 7.3  But not at goal yet.  will increase dose to 850 mg bid   Lab Results  Component Value Date   HGBA1C 7.3* 06/11/2014  . Lab Results  Component Value Date   MICROALBUR 24.1* 03/12/2014     Asked to  return in 3 months and have fasting labs done prior to visit.

## 2014-06-12 NOTE — Assessment & Plan Note (Signed)
BP 140/98 mmHg  Pulse 82  Temp(Src) 97.7 F (36.5 C) (Oral)  Resp 14  Ht 5\' 9"  (1.753 m)  Wt 203 lb 12 oz (92.42 kg)  BMI 30.07 kg/m2  SpO2 95%  BP has been persistently above goal on losartan alone.  Adding hctz today   Lab Results  Component Value Date   CREATININE 1.10 06/11/2014   Lab Results  Component Value Date   NA 139 06/11/2014   K 4.4 06/11/2014   CL 104 06/11/2014   CO2 28 06/11/2014

## 2014-06-12 NOTE — Assessment & Plan Note (Signed)
He has synovitis of PIPs bilaterally today and digits suggestive of psoriatic arhtritis.  Serologies for inflammatory arthritidies have been collected and referral to Dr Gavin PottersKernodle will be made

## 2014-06-12 NOTE — Assessment & Plan Note (Addendum)
INR has been historically  therapeutic on stable doses of warfarin, but due to medication changes today will need to repeat INR in 2 weeks .  Lab Results  Component Value Date   INR 2.3* 03/12/2014   INR 3.0* 12/06/2013   INR 2.5* 09/01/2013

## 2014-06-12 NOTE — Addendum Note (Signed)
Addended by: Sherlene ShamsULLO, Denisia Harpole L on: 06/12/2014 11:37 AM   Modules accepted: Orders

## 2014-06-12 NOTE — Assessment & Plan Note (Signed)
Last uric acid level was 7.0.  Rechecking today,  Will increase allopurinol dose for level > 6.0  No results found for: URICACID

## 2014-06-12 NOTE — Assessment & Plan Note (Signed)
Symptoms have resolved with significant weight loss and he is no longer using CPAP

## 2014-06-12 NOTE — Assessment & Plan Note (Signed)
LDL is now at goal since atorvastatin was prescribed in July for elevated LDL in setting of Type 2 DM and hypertension. LFTs are normal and he is tolerating therapy, but is having more episodes of polyarthritis which is under workup .  no changes today,   Lab Results  Component Value Date   CHOL 112 06/11/2014   HDL 30.90* 06/11/2014   LDLCALC 44 06/11/2014   LDLDIRECT 49.9 03/09/2014   TRIG 185.0* 06/11/2014   CHOLHDL 4 06/11/2014   Lab Results  Component Value Date   ALT 13 06/11/2014   AST 14 06/11/2014   ALKPHOS 56 06/11/2014   BILITOT 0.6 06/11/2014

## 2014-06-13 LAB — CYCLIC CITRUL PEPTIDE ANTIBODY, IGG: Cyclic Citrullin Peptide Ab: <2 U/mL (ref 0.0–5.0)

## 2014-06-13 LAB — ANA W/REFLEX IF POSITIVE: Anti Nuclear Antibody(ANA): NEGATIVE

## 2014-06-14 ENCOUNTER — Other Ambulatory Visit: Payer: Self-pay | Admitting: Internal Medicine

## 2014-06-25 ENCOUNTER — Ambulatory Visit (INDEPENDENT_AMBULATORY_CARE_PROVIDER_SITE_OTHER): Payer: Medicare Other | Admitting: *Deleted

## 2014-06-25 ENCOUNTER — Other Ambulatory Visit (INDEPENDENT_AMBULATORY_CARE_PROVIDER_SITE_OTHER): Payer: Medicare Other

## 2014-06-25 VITALS — BP 130/84 | HR 82

## 2014-06-25 DIAGNOSIS — I10 Essential (primary) hypertension: Secondary | ICD-10-CM

## 2014-06-25 DIAGNOSIS — Z7901 Long term (current) use of anticoagulants: Secondary | ICD-10-CM

## 2014-06-25 LAB — BASIC METABOLIC PANEL
BUN: 32 mg/dL — ABNORMAL HIGH (ref 6–23)
CO2: 30 mEq/L (ref 19–32)
Calcium: 9.5 mg/dL (ref 8.4–10.5)
Chloride: 101 mEq/L (ref 96–112)
Creatinine, Ser: 1.3 mg/dL (ref 0.40–1.50)
GFR: 57.53 mL/min — ABNORMAL LOW (ref >60.00)
Glucose, Bld: 120 mg/dL — ABNORMAL HIGH (ref 70–99)
Potassium: 3.7 mEq/L (ref 3.5–5.1)
Sodium: 139 mEq/L (ref 135–145)

## 2014-06-25 LAB — PROTIME-INR
INR: 2.5 ratio — ABNORMAL HIGH (ref 0.8–1.0)
Prothrombin Time: 27.2 s — ABNORMAL HIGH (ref 9.6–13.1)

## 2014-06-25 NOTE — Progress Notes (Addendum)
Pt presents for BP check. Doing well without complaints. Taking Losartan 100 mg daily and HCTZ 25 mg daily. See BP and HR. Advised Dr. Darrick Huntsmanullo would be notified and he would be contacted with any new instructions.    I have reviewed the above information and agree with above.   Duncan Dulleresa Tullo, MD

## 2014-06-26 ENCOUNTER — Other Ambulatory Visit: Payer: Medicare Other

## 2014-07-27 ENCOUNTER — Other Ambulatory Visit: Payer: Self-pay | Admitting: Internal Medicine

## 2014-08-27 ENCOUNTER — Other Ambulatory Visit: Payer: Self-pay | Admitting: Internal Medicine

## 2014-09-04 ENCOUNTER — Other Ambulatory Visit: Payer: Self-pay | Admitting: Internal Medicine

## 2014-09-10 ENCOUNTER — Ambulatory Visit (INDEPENDENT_AMBULATORY_CARE_PROVIDER_SITE_OTHER): Payer: Medicare Other | Admitting: Internal Medicine

## 2014-09-10 ENCOUNTER — Encounter: Payer: Self-pay | Admitting: Internal Medicine

## 2014-09-10 VITALS — BP 138/84 | HR 80 | Temp 97.8°F | Resp 16 | Ht 69.0 in | Wt 199.8 lb

## 2014-09-10 DIAGNOSIS — I10 Essential (primary) hypertension: Secondary | ICD-10-CM | POA: Diagnosis not present

## 2014-09-10 DIAGNOSIS — M064 Inflammatory polyarthropathy: Secondary | ICD-10-CM

## 2014-09-10 DIAGNOSIS — IMO0001 Reserved for inherently not codable concepts without codable children: Secondary | ICD-10-CM

## 2014-09-10 DIAGNOSIS — R809 Proteinuria, unspecified: Secondary | ICD-10-CM

## 2014-09-10 DIAGNOSIS — Z7901 Long term (current) use of anticoagulants: Secondary | ICD-10-CM | POA: Diagnosis not present

## 2014-09-10 DIAGNOSIS — E119 Type 2 diabetes mellitus without complications: Secondary | ICD-10-CM | POA: Diagnosis not present

## 2014-09-10 DIAGNOSIS — M130 Polyarthritis, unspecified: Secondary | ICD-10-CM

## 2014-09-10 LAB — PROTIME-INR
INR: 1.9 ratio — ABNORMAL HIGH (ref 0.8–1.0)
Prothrombin Time: 20.6 s — ABNORMAL HIGH (ref 9.6–13.1)

## 2014-09-10 LAB — MICROALBUMIN / CREATININE URINE RATIO
Creatinine,U: 116.1 mg/dL
Microalb Creat Ratio: 5.1 mg/g (ref 0.0–30.0)
Microalb, Ur: 5.9 mg/dL — ABNORMAL HIGH (ref 0.0–1.9)

## 2014-09-10 LAB — LIPID PANEL
Cholesterol: 95 mg/dL (ref 0–200)
HDL: 28.6 mg/dL — ABNORMAL LOW (ref >39.00)
LDL Cholesterol: 35 mg/dL (ref 0–99)
NonHDL: 66.4
Total CHOL/HDL Ratio: 3
Triglycerides: 157 mg/dL — ABNORMAL HIGH (ref 0.0–149.0)
VLDL: 31.4 mg/dL (ref 0.0–40.0)

## 2014-09-10 LAB — HEMOGLOBIN A1C: Hgb A1c MFr Bld: 6.6 % — ABNORMAL HIGH (ref 4.6–6.5)

## 2014-09-10 LAB — COMPREHENSIVE METABOLIC PANEL
ALT: 12 U/L (ref 0–53)
AST: 15 U/L (ref 0–37)
Albumin: 3.9 g/dL (ref 3.5–5.2)
Alkaline Phosphatase: 40 U/L (ref 39–117)
BUN: 28 mg/dL — ABNORMAL HIGH (ref 6–23)
CO2: 31 mEq/L (ref 19–32)
Calcium: 9.4 mg/dL (ref 8.4–10.5)
Chloride: 103 mEq/L (ref 96–112)
Creatinine, Ser: 1.22 mg/dL (ref 0.40–1.50)
GFR: 61.87 mL/min (ref >60.00)
Glucose, Bld: 124 mg/dL — ABNORMAL HIGH (ref 70–99)
Potassium: 3.8 mEq/L (ref 3.5–5.1)
Sodium: 140 mEq/L (ref 135–145)
Total Bilirubin: 0.4 mg/dL (ref 0.2–1.2)
Total Protein: 6.7 g/dL (ref 6.0–8.3)

## 2014-09-10 NOTE — Patient Instructions (Addendum)
If you lose 20 lbs,  You may be able to ge off of some of your medications  PLEASE GET YOUR ANNUAL EXAM ASAP!!  This is Dr. Melina Schools version of a  "Low GI"  Weight loss Diet.  It is appropriate for all patients with normal renal function , gluten tolerance, and advised for patients who have prediabetes or diabetes:   All of the foods can be found at grocery stores and in bulk at [employer].  The Atkins protein bars and shakes are available in more varieties at Target, WalMart and [employer]'s Foods.     7 AM Breakfast:  Choose from the following:  < 5 carbs  Weekdays: Low carbohydrate Protein  Shakes (EAS AdvantEdge "Carb Control" shakes, Atkins,  Muscle Milk or Premier Protein shakes)     Weekends:  a scrambled egg/bacon/cheese burrito made with Mission's "carb  balance" whole wheat tortilla  (about 10 net carbs )  Eggs,  bacon /sausage , Joseph's pita /lavash bread or  (5 carbs)  A slice of fritatta ( egg based baked dish, no  crust:  google it) (< 10 carbs)   Avoid cereal and bananas, 1 minute oatmeal, cream of wheat and grits. They are loaded with carbohydrates!   10 AM: high protein snack  (< 5 carbs)   Protein bar by Atkins  Or KIND  (the snack size, < 200 cal, usually < 6 carbs    A stick of cheese:  Around 1 carb,  100 cal      Other so called "protein bars" tend to be loaded with carbohydrates.  Remember, in food advertising, the word "energy" is synonymous for " carbohydrate."  Lunch:   A Sandwich using the bread choices listed, Can use any  Eggs,  lunchmeat, grilled meat or canned tuna).  Can add avocado, regular mayo/mustard  and cheese.  A Salad using blue cheese, ranch,  Goddess dressing  or vinagrette,  No croutons or "confetti" and no "candied nuts" but regular nuts OK.   2 HARD BOILED EGG WHITES AND A CUP OF one of these greek yogurts:    dannon lt n fit greek yogurt         chobani 100 greek yogurt,    Oikos triple zero greek yogurt       No pretzels or chips.  Pickles and  miniature sweet peppers are a good low  carb alternative that provide a "crunch"  The bread is the only source of carbohydrate in a sandwich and  can be decreased by trying some of these alternatives to traditional loaf bread:   Joseph's pita bread and Lavash (flat) bread :  50 cal and 4 net carbs  available at BJs and WalMart.  Taste better when toasted, use as pita chips  Toufayan makes a variety of  flatbreads and  A PITA POCKET.    LOOK FOR  THE ONES THAT ARE 17 NET CARBS OR LESS    Mission makes 2 sizes of  Low carb whole wheat tortillas  (The large one is  210 cal and 6 net carbs)   Avoid "Low fat dressings, as well as Reyne Dumas and [address] dressings    3 PM/ Mid day  Snack:  Consider  1 ounce of  almonds, walnuts, pistachios, pecans, peanuts,  Macadamia nuts or a nut medley that does not contain raisins or cranberries.  No "granola"; the dried cranberries and raisins are loaded with carbohydrates. Mixed nuts as long as there are no  raisins,  cranberries or dried fruit.    Try the prosciutto/mozzarella cheese sticks by [employer]  In deli /backery section   High protein   To avoid overindulging in snacks: Try drinking a glass of unsweeted almond/coconut milk  Or a cup of coffee with your Atkins chocolate bar to keep you from having 3!!!   Pork rinds!  Yes Pork Rinds are low carb potato chip substitute!   Toasted Joseph's flatbread with hummous dip (chickpeas)       6 PM  Dinner:     Meat/fowl/fish with a green salad, and either broccoli, cauliflower, green beans, spinach, brussel sprouts, bok choy or  Lima beans. Fried in canola oil /olive oil BUT DO NOT BREAD THE PROTEIN!!      There is a low carb pasta by Dreamfield's that is acceptable and tastes great: only 5 digestible carbs/serving.( All grocery stores but BJs carry it )  Prepared Meals:  Try Kai LevinsMichel Angelo's chicken piccata or chicken or eggplant parm over low carb pasta.(Lowes and BJs)   Clifton CustardAaron Sanchez's "Carnitas" (pulled  pork, no sauce,  0 carbs) or his beef pot roast to make a dinner burrito (at [employer])  Barbecue with cole slaw is low carb BUT NO BUN!  SAME WITH HAMBURGERS     Whole wheat pasta is still full of digestible carbs and  Not as low in glycemic index as Dreamfield's.   Brown rice is still rice,  So skip the rice and noodles if you eat Congohinese or [place] (or at least limit to 1/2 cup)  9 PM snack :   Breyer's "low carb" fudgsicle or  ice cream bar (Carb Smart line), or  Weight  Watcher's ice cream bar , or another "no sugar added" ice cream;  a serving of fresh berries/cherries with whipped cream   Cheese or greek yogurt   8 ounces of [place] unsweetened almond/cococunut milk  Cheese and crackers (using WASA crackers,  They are low carb) or peanut butter on low carb crackers or pita bread     Avoid bananas, pineapple, grapes  and watermelon on a regular basis because they are high in sugar.  THINK OF THEM AS DESSERT and do not have daily   Remember that snack Substitutions should be less than 10 NET carbs per serving and meals should be < 20 net carbs. Remember that carbohydrates from fiber do not affect blood sugar, so you can  subtract fiber grams to get the "net carbs " of any particular food item.

## 2014-09-10 NOTE — Progress Notes (Signed)
Patient ID: Stephen Kelly, male   DOB: [birth year], 74 y.o.   MRN: [id number]  Patient Active Problem List   Diagnosis Date Noted  . Anticoagulation monitoring, INR range 2-3 09/10/2014  . Medicare annual wellness visit, subsequent 03/13/2014  . Hyperlipidemia   . Polyarthritis of multiple sites 02/05/2014  . Long term current use of anticoagulant therapy 12/08/2013  . Encounter for initial preventive physical examination covered by [facility] 03/09/2013  . Hyperlipidemia associated with type 2 diabetes mellitus 03/03/2012  . Controlled type 2 diabetes mellitus with microalbuminuria or microproteinuria 08/31/2011  . Radial artery thrombosis, right 08/31/2011  . Actinic keratosis of left side of forehead 08/31/2011  . Patent foramen ovale   . Lupus anticoagulant inhibitor syndrome   . Hypertension 04/23/2011  . Screening for colon cancer 04/22/2011  . Sleep apnea, obstructive 04/22/2011  . Clotting disorder   . History of renal calculi   . Gout, arthritis   . Pulmonary nodule, right     Subjective:  CC:   Chief Complaint  Patient presents with  . Follow-up  . Diabetes    HPI:   Stephen Kelly is a 74 y.o. male who presents for  Follow up on DM,  Hypertension,  Hyperlipidemia, and joint pain .  We added hctz last visit,for elevated blood pressure,  Which he has been tolerating without hypotension or muscle cramps.     He was referred to Dr. Gavin Potters for  Newly diagnosed RA ,  Treated him some short term prednisone taper which improved but did not relieve the stiffness in his fingers .  He is using the aleve once or twice per week at most.   Still very active cutting grass ,  Painting houses, etc  He does not check his blood sugars unless he feels poorly.  He is following a low glycemic index diet.    Past Medical History  Diagnosis Date  . Hypertension   . History of renal calculi 2001    history of lithotripsy ,  no stent  . Gout, arthritis     managed with  allopurinol  . Pulmonary nodule, right 2011    unchanged, followed by gitten with serial ct  . Patent foramen ovale   . Lupus anticoagulant inhibitor syndrome     per gitten's test March 2012  . Clotting disorder 2011    Lupus Inhibitor positive by March 2012 studies  . Hyperlipidemia     Past Surgical History  Procedure Laterality Date  . Thrombectomy      left brachicephalic artery       The following portions of the patient's history were reviewed and updated as appropriate: Allergies, current medications, and problem list.    Review of Systems:   Patient denies headache, fevers, malaise, unintentional weight loss, skin rash, eye pain, sinus congestion and sinus pain, sore throat, dysphagia,  hemoptysis , cough, dyspnea, wheezing, chest pain, palpitations, orthopnea, edema, abdominal pain, nausea, melena, diarrhea, constipation, flank pain, dysuria, hematuria, urinary  Frequency, nocturia, numbness, tingling, seizures,  Focal weakness, Loss of consciousness,  Tremor, insomnia, depression, anxiety, and suicidal ideation.     History   Social History  . Marital Status: Married    Spouse Name: N/A  . Number of Children: N/A  . Years of Education: N/A   Occupational History  . Not on file.   Social History Main Topics  . Smoking status: Never Smoker   . Smokeless tobacco: Never Used  . Alcohol Use: No  .  Drug Use: No  . Sexual Activity: Not on file   Other Topics Concern  . Not on file   Social History Narrative    Objective:  Filed Vitals:   09/10/14 1004  BP: 138/84  Pulse: 80  Temp: 97.8 F (36.6 C)  Resp: 16     General appearance: alert, cooperative and appears stated age Ears: normal TM's and external ear canals both ears Throat: lips, mucosa, and tongue normal; teeth and gums normal Neck: no adenopathy, no carotid bruit, supple, symmetrical, trachea midline and thyroid not enlarged, symmetric, no tenderness/mass/nodules Back: symmetric, no  curvature. ROM normal. No CVA tenderness. Lungs: clear to auscultation bilaterally Heart: regular rate and rhythm, S1, S2 normal, no murmur, click, rub or gallop Abdomen: soft, non-tender; bowel sounds normal; no masses,  no organomegaly Pulses: 2+ and symmetric Skin: Skin color, texture, turgor normal. No rashes or lesions Lymph nodes: Cervical, supraclavicular, and axillary nodes normal.  Assessment and Plan:  Hypertension Improved  on current regimen. Renal function stable, no changes today.  Lab Results  Component Value Date   CREATININE 1.22 09/10/2014   Lab Results  Component Value Date   NA 140 09/10/2014   K 3.8 09/10/2014   CL 103 09/10/2014   CO2 31 09/10/2014      Controlled type 2 diabetes mellitus with microalbuminuria or microproteinuria Currently well-controlled on current medications .  hemoglobin A1c is at goal of less than 7.0 . Patient is reminded to schedule an annual eye exam and foot exam is normal today. Patient has  microalbuminuria. Patient is tolerating statin therapy for CAD risk reduction and on ACE/ARB for renal protection and hypertension .  Lab Results  Component Value Date   HGBA1C 6.6* 09/10/2014   Lab Results  Component Value Date   MICROALBUR 5.9* 09/10/2014    Lab Results  Component Value Date   CHOL 95 09/10/2014   HDL 28.60* 09/10/2014   LDLCALC 35 09/10/2014   LDLDIRECT 49.9 03/09/2014   TRIG 157.0* 09/10/2014   CHOLHDL 3 09/10/2014       Polyarthritis of multiple sites He has synovitis of PIPs bilaterally today and digits suggestive of psoriatic arthritis and Dr [facility] has diagnosed him with inflammatory arthritis.      Long term current use of anticoagulant therapy His INR is slightly sub therapeutic on 18 mg weekly ,  Will increase to 21 mg weekly and repeat PT/INR in one month./   Lab Results  Component Value Date   INR 1.9* 09/10/2014   INR 2.5* 06/25/2014   INR 2.3* 03/12/2014    A total of 25 minutes  of face to face time was spent with patient more than half of which was spent in counselling about the above mentioned conditions  and coordination of care   Updated Medication List Outpatient Encounter Prescriptions as of 09/10/2014  Medication Sig  . allopurinol (ZYLOPRIM) 100 MG tablet TAKE 1 TABLET (100 MG TOTAL) BY MOUTH DAILY.  [name]. atorvastatin (LIPITOR) 20 MG tablet Take 1 tablet (20 mg total) by mouth daily.  . hydrochlorothiazide (HYDRODIURIL) 25 MG tablet Take 1 tablet (25 mg total) by mouth daily.  [name]. losartan (COZAAR) 100 MG tablet TAKE 1 TABLET (100 MG TOTAL) BY MOUTH DAILY.  . metFORMIN (GLUCOPHAGE) 850 MG tablet TAKE 1 TABLET (850 MG TOTAL) BY MOUTH 2 (TWO) TIMES DAILY WITH A MEAL.  . [DISCONTINUED] warfarin (COUMADIN) 2 MG tablet TAKE 2 TABLETS ON TUESDAY, 2 ON FRIDAY, AND 1 TABLET ALL OTHER DAYS  .  fluocinonide-emollient (LIDEX-E) 0.05 % cream Apply 1 application topically 2 (two) times daily. (Patient not taking: Reported on 09/10/2014)  . guaiFENesin-codeine (ROBITUSSIN AC) 100-10 MG/5ML syrup Take 5 mLs by mouth 3 (three) times daily as needed for cough. (Patient not taking: Reported on 09/10/2014)  . naproxen sodium (ANAPROX) 220 MG tablet Take 220 mg by mouth 2 (two) times daily with a meal.  . omeprazole (PRILOSEC OTC) 20 MG tablet Take 20 mg by mouth daily.  . traMADol (ULTRAM) 50 MG tablet Take 1 tablet (50 mg total) by mouth every 8 (eight) hours as needed. (Patient not taking: Reported on 06/11/2014)  . warfarin (COUMADIN) 3 MG tablet Take 1 tablet (3 mg total) by mouth daily.     Orders Placed This Encounter  Procedures  . Comprehensive metabolic panel  . Hemoglobin A1c  . Lipid panel  . Microalbumin / creatinine urine ratio  . INR/PT  . HM DIABETES EYE EXAM    No Follow-up on file.

## 2014-09-10 NOTE — Progress Notes (Signed)
Pre-visit discussion using our clinic review tool. No additional management support is needed unless otherwise documented below in the visit note.  

## 2014-09-11 ENCOUNTER — Encounter: Payer: Self-pay | Admitting: Internal Medicine

## 2014-09-11 MED ORDER — WARFARIN SODIUM 3 MG PO TABS: 3.0000 mg | ORAL_TABLET | Freq: Every day | ORAL | Status: DC

## 2014-09-11 NOTE — Assessment & Plan Note (Signed)
His INR is slightly sub therapeutic on 18 mg weekly ,  Will increase to 21 mg weekly and repeat PT/INR in one month./   Lab Results  Component Value Date   INR 1.9* 09/10/2014   INR 2.5* 06/25/2014   INR 2.3* 03/12/2014

## 2014-09-11 NOTE — Assessment & Plan Note (Signed)
Currently well-controlled on current medications .  hemoglobin A1c is at goal of less than 7.0 . Patient is reminded to schedule an annual eye exam and foot exam is normal today. Patient has  microalbuminuria. Patient is tolerating statin therapy for CAD risk reduction and on ACE/ARB for renal protection and hypertension .  Lab Results  Component Value Date   HGBA1C 6.6* 09/10/2014   Lab Results  Component Value Date   MICROALBUR 5.9* 09/10/2014    Lab Results  Component Value Date   CHOL 95 09/10/2014   HDL 28.60* 09/10/2014   LDLCALC 35 09/10/2014   LDLDIRECT 49.9 03/09/2014   TRIG 157.0* 09/10/2014   CHOLHDL 3 09/10/2014

## 2014-09-11 NOTE — Assessment & Plan Note (Signed)
Improved  on current regimen. Renal function stable, no changes today.  Lab Results  Component Value Date   CREATININE 1.22 09/10/2014   Lab Results  Component Value Date   NA 140 09/10/2014   K 3.8 09/10/2014   CL 103 09/10/2014   CO2 31 09/10/2014

## 2014-09-11 NOTE — Assessment & Plan Note (Signed)
He has synovitis of PIPs bilaterally today and digits suggestive of psoriatic arthritis and Dr [facility] has diagnosed him with inflammatory arthritis.

## 2014-09-19 ENCOUNTER — Telehealth: Payer: Self-pay | Admitting: Internal Medicine

## 2014-09-19 NOTE — Telephone Encounter (Signed)
Patient would like for Olegario MessierKathy to give him a call. He did not say why.

## 2014-09-19 NOTE — Telephone Encounter (Signed)
Returned call to patient, patient calling as DPR for another patient. Concerning refill on medication I have sent the proper chart to MD.

## 2014-09-26 ENCOUNTER — Other Ambulatory Visit (INDEPENDENT_AMBULATORY_CARE_PROVIDER_SITE_OTHER): Payer: Medicare Other

## 2014-09-26 DIAGNOSIS — Z7901 Long term (current) use of anticoagulants: Secondary | ICD-10-CM

## 2014-09-26 LAB — PROTIME-INR
INR: 2.5 ratio — ABNORMAL HIGH (ref 0.8–1.0)
Prothrombin Time: 27.4 s — ABNORMAL HIGH (ref 9.6–13.1)

## 2014-10-01 ENCOUNTER — Other Ambulatory Visit: Payer: Self-pay | Admitting: *Deleted

## 2014-10-01 MED ORDER — METFORMIN HCL 850 MG PO TABS: ORAL_TABLET | ORAL | Status: DC

## 2014-10-29 ENCOUNTER — Other Ambulatory Visit (INDEPENDENT_AMBULATORY_CARE_PROVIDER_SITE_OTHER): Payer: Medicare Other

## 2014-10-29 DIAGNOSIS — Z7901 Long term (current) use of anticoagulants: Secondary | ICD-10-CM | POA: Diagnosis not present

## 2014-10-29 LAB — PROTIME-INR
INR: 3.6 ratio — ABNORMAL HIGH (ref 0.8–1.0)
Prothrombin Time: 38.9 s — ABNORMAL HIGH (ref 9.6–13.1)

## 2014-11-01 ENCOUNTER — Other Ambulatory Visit: Payer: Self-pay | Admitting: Internal Medicine

## 2014-11-01 DIAGNOSIS — D689 Coagulation defect, unspecified: Secondary | ICD-10-CM

## 2014-11-01 MED ORDER — WARFARIN SODIUM 2 MG PO TABS: 2.0000 mg | ORAL_TABLET | Freq: Every day | ORAL | Status: DC

## 2014-11-09 ENCOUNTER — Other Ambulatory Visit (INDEPENDENT_AMBULATORY_CARE_PROVIDER_SITE_OTHER): Payer: Medicare Other

## 2014-11-09 DIAGNOSIS — D689 Coagulation defect, unspecified: Secondary | ICD-10-CM | POA: Diagnosis not present

## 2014-11-09 LAB — PROTIME-INR
INR: 1.5 ratio — ABNORMAL HIGH (ref 0.8–1.0)
Prothrombin Time: 16.8 s — ABNORMAL HIGH (ref 9.6–13.1)

## 2014-11-11 NOTE — Addendum Note (Signed)
Addended by: Sherlene Shams on: 11/11/2014 01:34 PM   Modules accepted: Orders

## 2014-11-27 ENCOUNTER — Other Ambulatory Visit (INDEPENDENT_AMBULATORY_CARE_PROVIDER_SITE_OTHER): Payer: Medicare Other

## 2014-11-27 ENCOUNTER — Telehealth: Payer: Self-pay

## 2014-11-27 DIAGNOSIS — D689 Coagulation defect, unspecified: Secondary | ICD-10-CM | POA: Diagnosis not present

## 2014-11-27 LAB — PROTIME-INR
INR: 2.1 ratio — ABNORMAL HIGH (ref 0.8–1.0)
Prothrombin Time: 22.5 s — ABNORMAL HIGH (ref 9.6–13.1)

## 2014-11-27 NOTE — Telephone Encounter (Signed)
The patient called hoping to get information regarding his blood thinning medication.

## 2014-11-27 NOTE — Telephone Encounter (Signed)
Called pt, he states he no longer needs anything.  He spoke with the pharmacy about his Rx.

## 2014-12-04 ENCOUNTER — Other Ambulatory Visit: Payer: Self-pay | Admitting: *Deleted

## 2014-12-04 DIAGNOSIS — Z7901 Long term (current) use of anticoagulants: Secondary | ICD-10-CM

## 2014-12-04 MED ORDER — WARFARIN SODIUM 3 MG PO TABS: 3.0000 mg | ORAL_TABLET | Freq: Every day | ORAL | Status: DC

## 2014-12-05 ENCOUNTER — Other Ambulatory Visit: Payer: Self-pay | Admitting: Internal Medicine

## 2014-12-25 LAB — HM DIABETES EYE EXAM

## 2015-01-04 ENCOUNTER — Encounter: Payer: Self-pay | Admitting: Internal Medicine

## 2015-01-05 ENCOUNTER — Other Ambulatory Visit: Payer: Self-pay | Admitting: Internal Medicine

## 2015-03-11 ENCOUNTER — Ambulatory Visit (INDEPENDENT_AMBULATORY_CARE_PROVIDER_SITE_OTHER): Payer: Medicare Other | Admitting: Internal Medicine

## 2015-03-11 ENCOUNTER — Encounter: Payer: Self-pay | Admitting: Internal Medicine

## 2015-03-11 VITALS — BP 128/70 | HR 73 | Temp 97.8°F | Wt 196.0 lb

## 2015-03-11 DIAGNOSIS — M1009 Idiopathic gout, multiple sites: Secondary | ICD-10-CM

## 2015-03-11 DIAGNOSIS — R5383 Other fatigue: Secondary | ICD-10-CM

## 2015-03-11 DIAGNOSIS — I10 Essential (primary) hypertension: Secondary | ICD-10-CM | POA: Diagnosis not present

## 2015-03-11 DIAGNOSIS — D689 Coagulation defect, unspecified: Secondary | ICD-10-CM | POA: Diagnosis not present

## 2015-03-11 DIAGNOSIS — Z7901 Long term (current) use of anticoagulants: Secondary | ICD-10-CM

## 2015-03-11 DIAGNOSIS — E119 Type 2 diabetes mellitus without complications: Secondary | ICD-10-CM

## 2015-03-11 DIAGNOSIS — M109 Gout, unspecified: Secondary | ICD-10-CM

## 2015-03-11 DIAGNOSIS — IMO0001 Reserved for inherently not codable concepts without codable children: Secondary | ICD-10-CM

## 2015-03-11 DIAGNOSIS — Z0000 Encounter for general adult medical examination without abnormal findings: Secondary | ICD-10-CM

## 2015-03-11 DIAGNOSIS — E785 Hyperlipidemia, unspecified: Secondary | ICD-10-CM

## 2015-03-11 DIAGNOSIS — Z23 Encounter for immunization: Secondary | ICD-10-CM | POA: Diagnosis not present

## 2015-03-11 DIAGNOSIS — Z125 Encounter for screening for malignant neoplasm of prostate: Secondary | ICD-10-CM | POA: Diagnosis not present

## 2015-03-11 DIAGNOSIS — E1169 Type 2 diabetes mellitus with other specified complication: Secondary | ICD-10-CM

## 2015-03-11 DIAGNOSIS — R809 Proteinuria, unspecified: Secondary | ICD-10-CM

## 2015-03-11 DIAGNOSIS — E113219 Type 2 diabetes mellitus with mild nonproliferative diabetic retinopathy with macular edema, unspecified eye: Secondary | ICD-10-CM | POA: Diagnosis not present

## 2015-03-11 LAB — COMPREHENSIVE METABOLIC PANEL
ALT: 15 U/L (ref 0–53)
AST: 15 U/L (ref 0–37)
Albumin: 4.1 g/dL (ref 3.5–5.2)
Alkaline Phosphatase: 38 U/L — ABNORMAL LOW (ref 39–117)
BUN: 27 mg/dL — ABNORMAL HIGH (ref 6–23)
CO2: 29 mEq/L (ref 19–32)
Calcium: 9.5 mg/dL (ref 8.4–10.5)
Chloride: 104 mEq/L (ref 96–112)
Creatinine, Ser: 1.32 mg/dL (ref 0.40–1.50)
GFR: 56.41 mL/min — ABNORMAL LOW (ref >60.00)
Glucose, Bld: 119 mg/dL — ABNORMAL HIGH (ref 70–99)
Potassium: 4.2 mEq/L (ref 3.5–5.1)
Sodium: 141 mEq/L (ref 135–145)
Total Bilirubin: 0.6 mg/dL (ref 0.2–1.2)
Total Protein: 6.7 g/dL (ref 6.0–8.3)

## 2015-03-11 LAB — MICROALBUMIN / CREATININE URINE RATIO
Creatinine,U: 139.1 mg/dL
Microalb Creat Ratio: 3.5 mg/g (ref 0.0–30.0)
Microalb, Ur: 4.9 mg/dL — ABNORMAL HIGH (ref 0.0–1.9)

## 2015-03-11 LAB — HEMOGLOBIN A1C: Hgb A1c MFr Bld: 6.5 % (ref 4.6–6.5)

## 2015-03-11 LAB — CBC WITH DIFFERENTIAL/PLATELET
Basophils Absolute: 0 10*3/uL (ref 0.0–0.1)
Basophils Relative: 0.6 % (ref 0.0–3.0)
Eosinophils Absolute: 0.3 10*3/uL (ref 0.0–0.7)
Eosinophils Relative: 3.2 % (ref 0.0–5.0)
HCT: 38.9 % — ABNORMAL LOW (ref 39.0–52.0)
Hemoglobin: 12.9 g/dL — ABNORMAL LOW (ref 13.0–17.0)
Lymphocytes Relative: 26.4 % (ref 12.0–46.0)
Lymphs Abs: 2.1 10*3/uL (ref 0.7–4.0)
MCHC: 33.1 g/dL (ref 30.0–36.0)
MCV: 89.8 fl (ref 78.0–100.0)
Monocytes Absolute: 0.6 10*3/uL (ref 0.1–1.0)
Monocytes Relative: 6.9 % (ref 3.0–12.0)
Neutro Abs: 5.1 10*3/uL (ref 1.4–7.7)
Neutrophils Relative %: 62.9 % (ref 43.0–77.0)
Platelets: 257 10*3/uL (ref 150.0–400.0)
RBC: 4.33 Mil/uL (ref 4.22–5.81)
RDW: 14.8 % (ref 11.5–15.5)
WBC: 8.1 10*3/uL (ref 4.0–10.5)

## 2015-03-11 LAB — VITAMIN B12: Vitamin B-12: 239 pg/mL (ref 211–911)

## 2015-03-11 LAB — PROTIME-INR
INR: 4.5 ratio — ABNORMAL HIGH (ref 0.8–1.0)
Prothrombin Time: 47.8 s — ABNORMAL HIGH (ref 9.6–13.1)

## 2015-03-11 LAB — LIPID PANEL
Cholesterol: 105 mg/dL (ref 0–200)
HDL: 32.3 mg/dL — ABNORMAL LOW (ref >39.00)
NonHDL: 73.09
Total CHOL/HDL Ratio: 3
Triglycerides: 208 mg/dL — ABNORMAL HIGH (ref 0.0–149.0)
VLDL: 41.6 mg/dL — ABNORMAL HIGH (ref 0.0–40.0)

## 2015-03-11 LAB — PSA, MEDICARE: PSA: 1.15 ng/ml (ref 0.10–4.00)

## 2015-03-11 LAB — TSH: TSH: 1.81 u[IU]/mL (ref 0.35–4.50)

## 2015-03-11 LAB — LDL CHOLESTEROL, DIRECT: Direct LDL: 45 mg/dL

## 2015-03-11 MED ORDER — TRAZODONE HCL 50 MG PO TABS: 25.0000 mg | ORAL_TABLET | Freq: Every evening | ORAL | Status: DC | PRN

## 2015-03-11 NOTE — Progress Notes (Signed)
Patient ID: Elroy ChannelLevi [name] Henegar, male    DOB: 03/11/1941  Age: 74 y.o. MRN: [id number]  The patient is here for annual Medicare wellness examination and management of other chronic  Problems,.   The risk factors are reflected in the social history.  The roster of all physicians providing medical care to patient - is listed in the Snapshot section of the chart.  Activities of daily living:  The patient is 100% independent in all ADLs: dressing, toileting, feeding as well as independent mobility  Home safety : The patient has smoke detectors in the home. They wear seatbelts.  There are no firearms at home. There is no violence in the home.   There is no risks for hepatitis, STDs or HIV. There is no   history of blood transfusion. They have no travel history to infectious disease endemic areas of the world.  The patient has seen their dentist in the last six month. They have seen their eye doctor in the last year. They admit to slight hearing difficulty with regard to whispered voices and some television programs.  They have deferred audiologic testing in the last year.  They do not  have excessive sun exposure. Discussed the need for sun protection: hats, long sleeves and use of sunscreen if there is significant sun exposure.   Diet: the importance of a healthy diet is discussed. They do have a healthy diet.  The benefits of regular aerobic exercise were discussed. She walks 4 times per week ,  20 minutes.   Depression screen: there are no signs or vegative symptoms of depression- irritability, change in appetite, anhedonia, sadness/tearfullness.  Cognitive assessment: the patient manages all their financial and personal affairs and is actively engaged. They could relate day,date,year and events; recalled 2/3 objects at 3 minutes; performed clock-face test normally.  The following portions of the patient's history were reviewed and updated as appropriate: allergies, current medications, past family  history, past medical history,  past surgical history, past social history  and problem list.  Visual acuity was not assessed per patient preference since she has regular follow up with her ophthalmologist. Hearing and body mass index were assessed and reviewed.   During the course of the visit the patient was educated and counseled about appropriate screening and preventive services including : fall prevention , diabetes screening, nutrition counseling, colorectal cancer screening, and recommended immunizations.    CC: The primary encounter diagnosis was Anticoagulation monitoring, INR range 2-3. 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  follow up on chronic conditions including type 2 DM, gout, renal artery thrombosis, on chronic anticoagulation.    Does not check blood sugars  Has not check INR since July  3 mg daily  [facility]   Fasting today .  Not hungrey,  last meal 3 pm    History Pamelia HoitLevi has a past medical history of Hypertension; History of renal calculi (2001); Gout, arthritis; Pulmonary nodule, right (2011); Patent foramen ovale; 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HCC); Clotting disorder (HCC) (2011); and Hyperlipidemia.   He has past surgical history that includes Thrombectomy.   His family history includes  Cancer in his mother; Diabetes in his mother; Heart disease in his mother; Hypertension in his mother; Mental illness in his father; Stroke in his paternal  grandmother.He reports that he has never smoked. He has never used smokeless tobacco. He reports that he does not drink alcohol or use illicit drugs.  Outpatient Prescriptions Prior to Visit  Medication Sig Dispense Refill  . allopurinol (ZYLOPRIM) 100 MG tablet TAKE 1 TABLET (100 MG TOTAL) BY MOUTH DAILY. 90 tablet 1  . atorvastatin (LIPITOR) 20 MG tablet TAKE 1 TABLET (20 MG TOTAL) BY MOUTH DAILY. 90 tablet 1  . fluocinonide-emollient (LIDEX-E) 0.05 % cream Apply 1 application topically 2 (two) times daily. 60 g 2  . hydrochlorothiazide (HYDRODIURIL) 25 MG tablet Take 1 tablet (25 mg total) by mouth daily. 90 tablet 3  . losartan (COZAAR) 100 MG tablet TAKE 1 TABLET (100 MG TOTAL) BY MOUTH DAILY. 90 tablet 1  . metFORMIN (GLUCOPHAGE) 850 MG tablet TAKE 1 TABLET (850 MG TOTAL) BY MOUTH 2 (TWO) TIMES DAILY WITH A MEAL. 90 tablet 1  . naproxen sodium (ANAPROX) 220 MG tablet Take 220 mg by mouth 2 (two) times daily with a meal.    . traMADol (ULTRAM) 50 MG tablet Take 1 tablet (50 mg total) by mouth every 8 (eight) hours as needed. 90 tablet 0  . warfarin (COUMADIN) 3 MG tablet Take 1 tablet (3 mg total) by mouth daily. 30 tablet 2  . warfarin (COUMADIN) 2 MG tablet Take 1 tablet (2 mg total) by mouth daily. (Patient taking differently: Take 3 mg by mouth daily. ) 30 tablet 3  . omeprazole (PRILOSEC OTC) 20 MG tablet Take 20 mg by mouth daily.    [name] guaiFENesin-codeine (ROBITUSSIN AC) 100-10 MG/5ML syrup Take 5 mLs by mouth 3 (three) times daily as needed for cough. (Patient not taking: Reported on 09/10/2014) 180 mL 0   No facility-administered medications prior to visit.    Review of Systems   Patient denies headache, fevers, malaise, unintentional weight loss, skin rash, eye pain, sinus congestion and sinus pain, sore throat, dysphagia,  hemoptysis , cough, dyspnea, wheezing, chest pain, palpitations, orthopnea, edema, abdominal pain, nausea, melena, diarrhea, constipation, flank pain, dysuria,  hematuria, urinary  Frequency, nocturia, numbness, tingling, seizures,  Focal weakness, Loss of consciousness,  Tremor, insomnia, depression, anxiety, and suicidal ideation.     Objective:  BP 128/70 mmHg  Pulse 73  Temp(Src) 97.8 F (36.6 C) (Oral)  Wt 196 lb (88.905 kg)  SpO2 95%  Physical Exam   General appearance: alert, cooperative and appears stated age Ears: normal TM's and external ear canals both ears Throat: lips, mucosa, and tongue normal; teeth and gums normal Neck: no adenopathy, no carotid bruit, supple, symmetrical, trachea midline and thyroid not enlarged, symmetric, no tenderness/mass/nodules Back: symmetric, no curvature. ROM normal. No CVA tenderness. Lungs: clear to auscultation bilaterally Heart: regular rate and rhythm, S1, S2 normal, no murmur, click, rub or gallop Abdomen: soft, non-tender; bowel sounds normal; no masses,  no organomegaly Pulses: 2+ and symmetric Skin: Skin color, texture, turgor normal. No rashes or lesions Lymph nodes: Cervical, supraclavicular, and axillary nodes normal.   Assessment & Plan:   Problem List Items Addressed This Visit    Gout, arthritis    No flares since starting allopurinol .  Checking uric acid level.        Hypertension    Well controlled on current regimen. Renal function stable, no changes today.  Lab Results  Component Value Date  CREATININE 1.32 03/11/2015   Lab Results  Component Value Date   NA 141 03/11/2015   K 4.2 03/11/2015   CL 104 03/11/2015   CO2 29 03/11/2015         Controlled type 2 diabetes mellitus with microalbuminuria or microproteinuria    Currently well-controlled on current medications .  hemoglobin A1c is at goal of less than 7.0 . Patient is reminded to schedule an annual eye exam and foot exam is normal today. Patient has  microalbuminuria. Patient is tolerating statin therapy for CAD risk reduction and on ACE/ARB for renal protection and hypertension .  Lab Results   Component Value Date   HGBA1C 6.5 03/11/2015   Lab Results  Component Value Date   MICROALBUR 4.9* 03/11/2015    Lab Results  Component Value Date   CHOL 105 03/11/2015   HDL 32.30* 03/11/2015   LDLCALC 35 09/10/2014   LDLDIRECT 45.0 03/11/2015   TRIG 208.0* 03/11/2015   CHOLHDL 3 03/11/2015            Hyperlipidemia associated with type 2 diabetes mellitus (HCC)    LDL is now at goal since atorvastatin was prescribed in July for elevated LDL in setting of Type 2 DM and hypertension. LFTs are normal and he is tolerating therapy.   no changes today,   Lab Results  Component Value Date   CHOL 105 03/11/2015   HDL 32.30* 03/11/2015   LDLCALC 35 09/10/2014   LDLDIRECT 45.0 03/11/2015   TRIG 208.0* 03/11/2015   CHOLHDL 3 03/11/2015   Lab Results  Component Value Date   ALT 15 03/11/2015   AST 15 03/11/2015   ALKPHOS 38* 03/11/2015   BILITOT 0.6 03/11/2015             Medicare annual wellness visit, subsequent    Annual Medicare wellness  exam was done as well as a comprehensive physical exam and management of acute and chronic conditions .  During the course of the visit the patient was educated and counseled about appropriate screening and preventive services including : fall prevention , diabetes screening, nutrition counseling, colorectal cancer screening, and recommended immunizations.  Printed recommendations for health maintenance screenings was given.       Anticoagulation monitoring, INR range 2-3 - Primary    Coumadin level is  supratherapeutic,  Will suspend coumadin for 2 days,  Resume regular dose and  repeat PT/INR in one weel.    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 Morales guaiFENesin-codeine. I am also having him start on traZODone. Additionally, I am having him maintain his omeprazole, fluocinonide-emollient, traMADol, naproxen sodium, hydrochlorothiazide, losartan, allopurinol, warfarin, atorvastatin, and metFORMIN.  Meds ordered this encounter  Medications  . traZODone (DESYREL) 50 MG tablet    Sig: Take 0.5-1 tablets (25-50 mg total) by mouth at bedtime as needed for sleep.    Dispense:  30 tablet    Refill:  3    Medications Discontinued During This Encounter  Medication Reason  . warfarin (COUMADIN) 2 MG tablet   . guaiFENesin-codeine (ROBITUSSIN AC) 100-10 MG/5ML syrup     Follow-up: No Follow-up on file.   Sherlene Shams, MD

## 2015-03-11 NOTE — Patient Instructions (Signed)
You can use trazodone to help you sleep  Start with 1/2 tablet (50 mg) one hour before bedtime .      Health Maintenance, Male A healthy lifestyle and preventative care can promote health and wellness.  Maintain regular health, dental, and eye exams.  Eat a healthy diet. Foods like vegetables, fruits, whole grains, low-fat dairy products, and lean protein foods contain the nutrients you need and are low in calories. Decrease your intake of foods high in solid fats, added sugars, and salt. Get information about a proper diet from your health care provider, if necessary.  Regular physical exercise is one of the most important things you can do for your health. Most adults should get at least 150 minutes of moderate-intensity exercise (any activity that increases your heart rate and causes you to sweat) each week. In addition, most adults need muscle-strengthening exercises on 2 or more days a week.   Maintain a healthy weight. The body mass index (BMI) is a screening tool to identify possible weight problems. It provides an estimate of body fat based on height and weight. Your health care provider can find your BMI and can help you achieve or maintain a healthy weight. For males 20 years and older:  A BMI below 18.5 is considered underweight.  A BMI of 18.5 to 24.9 is normal.  A BMI of 25 to 29.9 is considered overweight.  A BMI of 30 and above is considered obese.  Maintain normal blood lipids and cholesterol by exercising and minimizing your intake of saturated fat. Eat a balanced diet with plenty of fruits and vegetables. Blood tests for lipids and cholesterol should begin at age 73 and be repeated every 5 years. If your lipid or cholesterol levels are high, you are over age 73, or you are at high risk for heart disease, you may need your cholesterol levels checked more frequently.Ongoing high lipid and cholesterol levels should be treated with medicines if diet and exercise are not  working.  If you smoke, find out from your health care provider how to quit. If you do not use tobacco, do not start.  Lung cancer screening is recommended for adults aged 55-80 years who are at high risk for developing lung cancer because of a history of smoking. A yearly low-dose CT scan of the lungs is recommended for people who have at least a 30-pack-year history of smoking and are current smokers or have quit within the past 15 years. A pack year of smoking is smoking an average of 1 pack of cigarettes a day for 1 year (for example, a 30-pack-year history of smoking could mean smoking 1 pack a day for 30 years or 2 packs a day for 15 years). Yearly screening should continue until the smoker has stopped smoking for at least 15 years. Yearly screening should be stopped for people who develop a health problem that would prevent them from having lung cancer treatment.  If you choose to drink alcohol, do not have more than 2 drinks per day. One drink is considered to be 12 oz (360 mL) of beer, 5 oz (150 mL) of wine, or 1.5 oz (45 mL) of liquor.  Avoid the use of street drugs. Do not share needles with anyone. Ask for help if you need support or instructions about stopping the use of drugs.  High blood pressure causes heart disease and increases the risk of stroke. High blood pressure is more likely to develop in:  People who have blood  pressure in the end of the normal range (100-139/85-89 mm Hg).  People who are overweight or obese.  People who are African American.  If you are 55-58 years of age, have your blood pressure checked every 3-5 years. If you are 63 years of age or older, have your blood pressure checked every year. You should have your blood pressure measured twice--once when you are at a hospital or clinic, and once when you are not at a hospital or clinic. Record the average of the two measurements. To check your blood pressure when you are not at a hospital or clinic, you can  use:  An automated blood pressure machine at a pharmacy.  A home blood pressure monitor.  If you are 17-28 years old, ask your health care provider if you should take aspirin to prevent heart disease.  Diabetes screening involves taking a blood sample to check your fasting blood sugar level. This should be done once every 3 years after age 21 if you are at a normal weight and without risk factors for diabetes. Testing should be considered at a younger age or be carried out more frequently if you are overweight and have at least 1 risk factor for diabetes.  Colorectal cancer can be detected and often prevented. Most routine colorectal cancer screening begins at the age of 75 and continues through age 75. However, your health care provider may recommend screening at an earlier age if you have risk factors for colon cancer. On a yearly basis, your health care provider may provide home test kits to check for hidden blood in the stool. A small camera at the end of a tube may be used to directly examine the colon (sigmoidoscopy or colonoscopy) to detect the earliest forms of colorectal cancer. Talk to your health care provider about this at age 37 when routine screening begins. A direct exam of the colon should be repeated every 5-10 years through age 45, unless early forms of precancerous polyps or small growths are found.  People who are at an increased risk for hepatitis B should be screened for this virus. You are considered at high risk for hepatitis B if:  You were born in a country where hepatitis B occurs often. Talk with your health care provider about which countries are considered high risk.  Your parents were born in a high-risk country and you have not received a shot to protect against hepatitis B (hepatitis B vaccine).  You have HIV or AIDS.  You use needles to inject street drugs.  You live with, or have sex with, someone who has hepatitis B.  You are a man who has sex with other  men (MSM).  You get hemodialysis treatment.  You take certain medicines for conditions like cancer, organ transplantation, and autoimmune conditions.  Hepatitis C blood testing is recommended for all people born from [birth year] through 1964 and any individual with known risk factors for hepatitis C.  Healthy men should no longer receive prostate-specific antigen (PSA) blood tests as part of routine cancer screening. Talk to your health care provider about prostate cancer screening.  Testicular cancer screening is not recommended for adolescents or adult males who have no symptoms. Screening includes self-exam, a health care provider exam, and other screening tests. Consult with your health care provider about any symptoms you have or any concerns you have about testicular cancer.  Practice safe sex. Use condoms and avoid high-risk sexual practices to reduce the spread of sexually transmitted infections (STIs).  You should be screened for STIs, including gonorrhea and chlamydia if:  You are sexually active and are younger than 24 years.  You are older than 24 years, and your health care provider tells you that you are at risk for this type of infection.  Your sexual activity has changed since you were last screened, and you are at an increased risk for chlamydia or gonorrhea. Ask your health care provider if you are at risk.  If you are at risk of being infected with HIV, it is recommended that you take a prescription medicine daily to prevent HIV infection. This is called pre-exposure prophylaxis (PrEP). You are considered at risk if:  You are a man who has sex with other men (MSM).  You are a heterosexual man who is sexually active with multiple partners.  You take drugs by injection.  You are sexually active with a partner who has HIV.  Talk with your health care provider about whether you are at high risk of being infected with HIV. If you choose to begin PrEP, you should first be tested  for HIV. You should then be tested every 3 months for as long as you are taking PrEP.  Use sunscreen. Apply sunscreen liberally and repeatedly throughout the day. You should seek shade when your shadow is shorter than you. Protect yourself by wearing long sleeves, pants, a wide-brimmed hat, and sunglasses year round whenever you are outdoors.  Tell your health care provider of new moles or changes in moles, especially if there is a change in shape or color. Also, tell your health care provider if a mole is larger than the size of a pencil eraser.  A one-time screening for abdominal aortic aneurysm (AAA) and surgical repair of large AAAs by ultrasound is recommended for men aged 26-75 years who are current or former smokers.  Stay current with your vaccines (immunizations).   This information is not intended to replace advice given to you by your health care provider. Make sure you discuss any questions you have with your health care provider.   Document Released: 11/07/2007 Document Revised: 06/01/2014 Document Reviewed: 10/06/2010 Elsevier Interactive Patient Education [employer].

## 2015-03-12 ENCOUNTER — Other Ambulatory Visit: Payer: Self-pay | Admitting: Internal Medicine

## 2015-03-12 DIAGNOSIS — E538 Deficiency of other specified B group vitamins: Secondary | ICD-10-CM

## 2015-03-12 NOTE — Assessment & Plan Note (Signed)
Coumadin level is  supratherapeutic,  Will suspend coumadin for 2 days,  Resume regular dose and  repeat PT/INR in one weel.    Lab Results  Component Value Date   INR 4.5* 03/11/2015   INR 2.1* 11/27/2014   INR 1.5* 11/09/2014

## 2015-03-12 NOTE — Assessment & Plan Note (Signed)

## 2015-03-12 NOTE — Addendum Note (Signed)
Addended by: Sherlene ShamsULLO, Charon Akamine L on: 03/12/2015 08:56 PM   Modules accepted: Orders

## 2015-03-12 NOTE — Assessment & Plan Note (Signed)
Well controlled on current regimen. Renal function stable, no changes today.  Lab Results  Component Value Date   CREATININE 1.32 03/11/2015   Lab Results  Component Value Date   NA 141 03/11/2015   K 4.2 03/11/2015   CL 104 03/11/2015   CO2 29 03/11/2015

## 2015-03-12 NOTE — Assessment & Plan Note (Signed)
LDL is now at goal since atorvastatin was prescribed in July for elevated LDL in setting of Type 2 DM and hypertension. LFTs are normal and he is tolerating therapy.   no changes today,   Lab Results  Component Value Date   CHOL 105 03/11/2015   HDL 32.30* 03/11/2015   LDLCALC 35 09/10/2014   LDLDIRECT 45.0 03/11/2015   TRIG 208.0* 03/11/2015   CHOLHDL 3 03/11/2015   Lab Results  Component Value Date   ALT 15 03/11/2015   AST 15 03/11/2015   ALKPHOS 38* 03/11/2015   BILITOT 0.6 03/11/2015

## 2015-03-12 NOTE — Assessment & Plan Note (Signed)
No flares since starting allopurinol .  Checking uric acid level.

## 2015-03-12 NOTE — Assessment & Plan Note (Signed)
Currently well-controlled on current medications .  hemoglobin A1c is at goal of less than 7.0 . Patient is reminded to schedule an annual eye exam and foot exam is normal today. Patient has  microalbuminuria. Patient is tolerating statin therapy for CAD risk reduction and on ACE/ARB for renal protection and hypertension .  Lab Results  Component Value Date   HGBA1C 6.5 03/11/2015   Lab Results  Component Value Date   MICROALBUR 4.9* 03/11/2015    Lab Results  Component Value Date   CHOL 105 03/11/2015   HDL 32.30* 03/11/2015   LDLCALC 35 09/10/2014   LDLDIRECT 45.0 03/11/2015   TRIG 208.0* 03/11/2015   CHOLHDL 3 03/11/2015

## 2015-03-13 ENCOUNTER — Other Ambulatory Visit: Payer: Self-pay | Admitting: Internal Medicine

## 2015-03-22 ENCOUNTER — Other Ambulatory Visit (INDEPENDENT_AMBULATORY_CARE_PROVIDER_SITE_OTHER): Payer: Medicare Other

## 2015-03-22 ENCOUNTER — Other Ambulatory Visit: Payer: Self-pay | Admitting: Internal Medicine

## 2015-03-22 DIAGNOSIS — E538 Deficiency of other specified B group vitamins: Secondary | ICD-10-CM

## 2015-03-22 DIAGNOSIS — Z7901 Long term (current) use of anticoagulants: Secondary | ICD-10-CM

## 2015-03-22 LAB — PROTIME-INR
INR: 3.8 ratio — ABNORMAL HIGH (ref 0.8–1.0)
Prothrombin Time: 40.8 s — ABNORMAL HIGH (ref 9.6–13.1)

## 2015-03-22 MED ORDER — WARFARIN SODIUM 2 MG PO TABS
2.0000 mg | ORAL_TABLET | Freq: Every day | ORAL | Status: DC
Start: 2015-03-22 — End: 2016-02-12

## 2015-03-22 NOTE — Assessment & Plan Note (Signed)
Regimen decreased to 2 mg Mon Wed and Friday 3 mg all other days due to INR 3.8  Repeat INR 2 weeks

## 2015-03-23 MED ORDER — WARFARIN SODIUM 3 MG PO TABS: 3.0000 mg | ORAL_TABLET | Freq: Every day | ORAL | Status: DC

## 2015-03-23 NOTE — Addendum Note (Signed)
Addended by: Sherlene ShamsULLO, Aymara Sassi L on: 03/23/2015 02:08 PM   Modules accepted: Orders

## 2015-03-29 LAB — METHYLMALONIC ACID(MMA), RND URINE
Creatinine: 13.05 mmol/L (ref 2.38–26.55)
Methylmalonic acid: 1 mmol/mol{creat} (ref 0.3–1.9)

## 2015-04-05 ENCOUNTER — Other Ambulatory Visit (INDEPENDENT_AMBULATORY_CARE_PROVIDER_SITE_OTHER): Payer: Medicare Other

## 2015-04-05 DIAGNOSIS — Z7901 Long term (current) use of anticoagulants: Secondary | ICD-10-CM | POA: Diagnosis not present

## 2015-04-05 LAB — PROTIME-INR
INR: 2.1 ratio — ABNORMAL HIGH (ref 0.8–1.0)
Prothrombin Time: 23.2 s — ABNORMAL HIGH (ref 9.6–13.1)

## 2015-04-11 ENCOUNTER — Other Ambulatory Visit: Payer: Self-pay | Admitting: Internal Medicine

## 2015-05-06 ENCOUNTER — Telehealth: Payer: Self-pay | Admitting: Internal Medicine

## 2015-05-28 ENCOUNTER — Telehealth: Payer: Self-pay | Admitting: Internal Medicine

## 2015-05-28 MED ORDER — LOSARTAN POTASSIUM 100 MG PO TABS: ORAL_TABLET | ORAL | Status: DC

## 2015-05-28 NOTE — Telephone Encounter (Signed)
[facility] called and left a vm regarding a prescription refill for losartan (COZAAR) 100 MG tablet. Pt is [facility] to [facility] [phone]. [facility] has no more refills. Thank You!

## 2015-06-11 ENCOUNTER — Other Ambulatory Visit: Payer: Self-pay | Admitting: Internal Medicine

## 2015-06-27 ENCOUNTER — Other Ambulatory Visit: Payer: Self-pay | Admitting: Internal Medicine

## 2015-10-04 ENCOUNTER — Other Ambulatory Visit: Payer: Self-pay | Admitting: Internal Medicine

## 2015-10-09 DIAGNOSIS — M79673 Pain in unspecified foot: Secondary | ICD-10-CM

## 2015-11-04 ENCOUNTER — Other Ambulatory Visit: Payer: Self-pay | Admitting: Internal Medicine

## 2015-11-11 ENCOUNTER — Other Ambulatory Visit: Payer: Self-pay | Admitting: Internal Medicine

## 2015-11-28 ENCOUNTER — Other Ambulatory Visit: Payer: Self-pay | Admitting: Internal Medicine

## 2015-12-05 ENCOUNTER — Other Ambulatory Visit: Payer: Self-pay | Admitting: Internal Medicine

## 2015-12-10 ENCOUNTER — Telehealth: Payer: Self-pay | Admitting: *Deleted

## 2015-12-10 NOTE — Telephone Encounter (Signed)
Patient has requested to have a call from [place], he has some personal questions to discuss. Pt contact [phone]

## 2015-12-10 NOTE — Telephone Encounter (Signed)
Patient just wanted to confirm appointment.

## 2015-12-10 NOTE — Telephone Encounter (Signed)
Please advise, thanks.

## 2015-12-18 ENCOUNTER — Encounter: Payer: Self-pay | Admitting: Internal Medicine

## 2015-12-18 ENCOUNTER — Ambulatory Visit (INDEPENDENT_AMBULATORY_CARE_PROVIDER_SITE_OTHER): Payer: Medicare Other | Admitting: Internal Medicine

## 2015-12-18 VITALS — BP 104/68 | HR 96 | Temp 97.8°F | Resp 16 | Wt 193.0 lb

## 2015-12-18 DIAGNOSIS — E119 Type 2 diabetes mellitus without complications: Secondary | ICD-10-CM | POA: Diagnosis not present

## 2015-12-18 DIAGNOSIS — E1169 Type 2 diabetes mellitus with other specified complication: Secondary | ICD-10-CM

## 2015-12-18 DIAGNOSIS — Z7901 Long term (current) use of anticoagulants: Secondary | ICD-10-CM

## 2015-12-18 DIAGNOSIS — IMO0001 Reserved for inherently not codable concepts without codable children: Secondary | ICD-10-CM

## 2015-12-18 DIAGNOSIS — E785 Hyperlipidemia, unspecified: Secondary | ICD-10-CM | POA: Diagnosis not present

## 2015-12-18 DIAGNOSIS — I10 Essential (primary) hypertension: Secondary | ICD-10-CM | POA: Diagnosis not present

## 2015-12-18 DIAGNOSIS — R809 Proteinuria, unspecified: Secondary | ICD-10-CM

## 2015-12-18 LAB — HEMOGLOBIN A1C: Hgb A1c MFr Bld: 6.4 % (ref 4.6–6.5)

## 2015-12-18 LAB — LIPID PANEL
Cholesterol: 105 mg/dL (ref 0–200)
HDL: 29.9 mg/dL — ABNORMAL LOW (ref >39.00)
NonHDL: 74.94
Total CHOL/HDL Ratio: 4
Triglycerides: 324 mg/dL — ABNORMAL HIGH (ref 0.0–149.0)
VLDL: 64.8 mg/dL — ABNORMAL HIGH (ref 0.0–40.0)

## 2015-12-18 LAB — COMPREHENSIVE METABOLIC PANEL
ALT: 14 U/L (ref 0–53)
AST: 14 U/L (ref 0–37)
Albumin: 4.2 g/dL (ref 3.5–5.2)
Alkaline Phosphatase: 42 U/L (ref 39–117)
BUN: 28 mg/dL — ABNORMAL HIGH (ref 6–23)
CO2: 32 mEq/L (ref 19–32)
Calcium: 11 mg/dL — ABNORMAL HIGH (ref 8.4–10.5)
Chloride: 97 mEq/L (ref 96–112)
Creatinine, Ser: 1.4 mg/dL (ref 0.40–1.50)
GFR: 52.6 mL/min — ABNORMAL LOW (ref >60.00)
Glucose, Bld: 106 mg/dL — ABNORMAL HIGH (ref 70–99)
Potassium: 4.1 mEq/L (ref 3.5–5.1)
Sodium: 137 mEq/L (ref 135–145)
Total Bilirubin: 0.5 mg/dL (ref 0.2–1.2)
Total Protein: 7.1 g/dL (ref 6.0–8.3)

## 2015-12-18 LAB — PROTIME-INR
INR: 2.4 ratio — ABNORMAL HIGH (ref 0.8–1.0)
Prothrombin Time: 25.8 s — ABNORMAL HIGH (ref 9.6–13.1)

## 2015-12-18 LAB — MICROALBUMIN / CREATININE URINE RATIO
Creatinine,U: 30.8 mg/dL
Microalb Creat Ratio: 2.9 mg/g (ref 0.0–30.0)
Microalb, Ur: 0.9 mg/dL (ref 0.0–1.9)

## 2015-12-18 LAB — LDL CHOLESTEROL, DIRECT: Direct LDL: 41 mg/dL

## 2015-12-18 NOTE — Patient Instructions (Signed)
I am praying for you and Stephen Kelly every night   Let me know if you need anything    We are following up on your diabetes and cholesterol today  You will probably have to resume  Lipitor

## 2015-12-18 NOTE — Progress Notes (Signed)
Subjective:  Patient ID: Stephen Kelly, male    DOB: 1941/01/28  Age: 75 y.o. MRN: [id number]  CC: The primary encounter diagnosis was Diabetes mellitus without complication ([place]). Diagnoses of Hyperlipidemia associated with type 2 diabetes mellitus ([place]), Essential hypertension, Long term current use of anticoagulant therapy, Controlled type 2 diabetes mellitus with microalbuminuria or microproteinuria, and Anticoagulation monitoring, INR range 2-3 were also pertinent to this visit.  HPI Stephen Kelly presents for follow up on diabetes, hypertension and hyperlipidemia .  He is distraught today because his wife  Stephen Kelly has been hospitaled since July 16 with bacteremia due to URI, and she has been seen  Palliative care due to  progressive myeloma and unrelenting pain. Stephen Kelly  He has been lost to follow up due to Stephen Kelly's battling multiple myeloma. His last a1c was done in October.  He has not been checking his sugars,  Or following a low GI diet.  And he has stopped taking Lipitor.    Outpatient Medications Prior to Visit  Medication Sig Dispense Refill  . allopurinol (ZYLOPRIM) 100 MG tablet TAKE ONE (1) TABLET BY MOUTH EVERY DAY 90 tablet 2  . hydrochlorothiazide (HYDRODIURIL) 25 MG tablet TAKE 1 TABLET (25 MG TOTAL) BY MOUTH DAILY. 90 tablet 3  . losartan (COZAAR) 100 MG tablet TAKE ONE TABLET BY MOUTH EVERY DAY 90 tablet 2  . metFORMIN (GLUCOPHAGE) 850 MG tablet TAKE ONE (1) TABLET BY MOUTH TWO (2) TIMES DAILY WITH FOOD 60 tablet 3  . warfarin (COUMADIN) 2 MG tablet Take 1 tablet (2 mg total) by mouth daily. 30 tablet 3  . warfarin (COUMADIN) 3 MG tablet TAKE ONE (1) TABLET BY MOUTH EVERY DAY 30 tablet 1  . omeprazole (PRILOSEC OTC) 20 MG tablet Take 20 mg by mouth daily.    . traMADol (ULTRAM) 50 MG tablet Take 1 tablet (50 mg total) by mouth every 8 (eight) hours as needed. 90 tablet 0  . traZODone (DESYREL) 50 MG tablet Take 0.5-1 tablets (25-50 mg total) by mouth at bedtime as  needed for sleep. 30 tablet 3  . atorvastatin (LIPITOR) 20 MG tablet TAKE ONE (1) TABLET EACH DAY 90 tablet 3  . fluocinonide-emollient (LIDEX-E) 0.05 % cream Apply 1 application topically 2 (two) times daily. (Patient not taking: Reported on 12/18/2015) 60 g 2  . naproxen sodium (ANAPROX) 220 MG tablet Take 220 mg by mouth 2 (two) times daily with a meal.     No facility-administered medications prior to visit.     Review of Systems;  Patient denies headache, fevers, malaise, unintentional weight loss, skin rash, eye pain, sinus congestion and sinus pain, sore throat, dysphagia,  hemoptysis , cough, dyspnea, wheezing, chest pain, palpitations, orthopnea, edema, abdominal pain, nausea, melena, diarrhea, constipation, flank pain, dysuria, hematuria, urinary  Frequency, nocturia, numbness, tingling, seizures,  Focal weakness, Loss of consciousness,  Tremor, insomnia, depression, anxiety, and suicidal ideation.      Objective:  BP 104/68 (BP Location: Right Arm)   Pulse 96   Temp 97.8 F (36.6 C) (Oral)   Resp 16   Wt 193 lb (87.5 kg)   BMI 28.50 kg/m   BP Readings from Last 3 Encounters:  12/21/15 104/68  03/11/15 128/70  09/10/14 138/84    Wt Readings from Last 3 Encounters:  12/21/15 193 lb (87.5 kg)  03/11/15 196 lb (88.9 kg)  09/10/14 199 lb 12 oz (90.6 kg)    General appearance: alert, cooperative and appears stated age Ears: normal TM's  and external ear canals both ears Throat: lips, mucosa, and tongue normal; teeth and gums normal Neck: no adenopathy, no carotid bruit, supple, symmetrical, trachea midline and thyroid not enlarged, symmetric, no tenderness/mass/nodules Back: symmetric, no curvature. ROM normal. No CVA tenderness. Lungs: clear to auscultation bilaterally Heart: regular rate and rhythm, S1, S2 normal, no murmur, click, rub or gallop Abdomen: soft, non-tender; bowel sounds normal; no masses,  no organomegaly Pulses: 2+ and symmetric Skin: Skin color,  texture, turgor normal. No rashes or lesions Lymph nodes: Cervical, supraclavicular, and axillary nodes normal.  Lab Results  Component Value Date   HGBA1C 6.4 12/18/2015   HGBA1C 6.5 03/11/2015   HGBA1C 6.6 (H) 09/10/2014    Lab Results  Component Value Date   CREATININE 1.40 12/18/2015   CREATININE 1.32 03/11/2015   CREATININE 1.22 09/10/2014    Lab Results  Component Value Date   WBC 8.1 03/11/2015   HGB 12.9 (L) 03/11/2015   HCT 38.9 (L) 03/11/2015   PLT 257.0 03/11/2015   GLUCOSE 106 (H) 12/18/2015   CHOL 105 12/18/2015   TRIG 324.0 (H) 12/18/2015   HDL 29.90 (L) 12/18/2015   LDLDIRECT 41.0 12/18/2015   LDLCALC 35 09/10/2014   ALT 14 12/18/2015   AST 14 12/18/2015   NA 137 12/18/2015   K 4.1 12/18/2015   CL 97 12/18/2015   CREATININE 1.40 12/18/2015   BUN 28 (H) 12/18/2015   CO2 32 12/18/2015   TSH 1.81 03/11/2015   PSA 1.15 03/11/2015   INR 2.4 (H) 12/18/2015   HGBA1C 6.4 12/18/2015   MICROALBUR 0.9 12/18/2015    Dg Hand Complete Left  Result Date: 06/11/2014 CLINICAL DATA:  Bilateral hand pain and swelling for 3 weeks. Fourth and fifth digit pain. EXAM: LEFT HAND - COMPLETE 3+ VIEW COMPARISON:  None. FINDINGS: No acute osseous or joint abnormality. No joint bodies narrowing, joint subluxation or erosive changes. IMPRESSION: No findings to explain the patient's pain. Electronically Signed   By: Lorin Picket M.D.   On: 06/11/2014 15:35   Dg Hand Complete Right  Result Date: 06/11/2014 CLINICAL DATA:  BILATERAL hand pain and swelling for 3 week, pain at lateral wrist, across palm, and in fourth and fifth digits of both hands, polyarthritis EXAM: RIGHT HAND - COMPLETE 3+ VIEW COMPARISON:  None FINDINGS: Osseous demineralization. Joint spaces preserved. No acute fracture, dislocation, or bone destruction. No definite erosive or inflammatory changes. IMPRESSION: No acute abnormalities. Osseous demineralization diffusely. Electronically Signed   By: Lavonia Dana  M.D.   On: 06/11/2014 15:34    Assessment & Plan:   Problem List Items Addressed This Visit    Controlled type 2 diabetes mellitus with microalbuminuria or microproteinuria    Currently well-controlled on current medications .  hemoglobin A1c is at goal of less than 7.0 . Patient is reminded to schedule an annual eye exam and foot exam is normal today. Patient has  microalbuminuria. Patient is tolerating statin therapy for CAD risk reduction and on ACE/ARB for renal protection and hypertension .  Lab Results  Component Value Date   HGBA1C 6.4 12/18/2015   Lab Results  Component Value Date   MICROALBUR 0.9 12/18/2015    Lab Results  Component Value Date   CHOL 105 12/18/2015   HDL 29.90 (L) 12/18/2015   LDLCALC 35 09/10/2014   LDLDIRECT 41.0 12/18/2015   TRIG 324.0 (H) 12/18/2015   CHOLHDL 4 12/18/2015            Hyperlipidemia associated with type  2 diabetes mellitus ([place])    LDL is now at goal since atorvastatin was prescribed in July for elevated LDL in setting of Type 2 DM and hypertension. Triglycerides are elevated as he is not fasting. LFTs are normal and he is tolerating therapy.   no changes today,   Lab Results  Component Value Date   CHOL 105 12/18/2015   HDL 29.90 (L) 12/18/2015   LDLCALC 35 09/10/2014   LDLDIRECT 41.0 12/18/2015   TRIG 324.0 (H) 12/18/2015   CHOLHDL 4 12/18/2015   Lab Results  Component Value Date   ALT 14 12/18/2015   AST 14 12/18/2015   ALKPHOS 42 12/18/2015   BILITOT 0.5 12/18/2015             Relevant Orders   LDL cholesterol, direct (Completed)   Lipid panel (Completed)   Anticoagulation monitoring, INR range 2-3    At goal on current regimen.  No changes  Lab Results  Component Value Date   INR 2.4 (H) 12/18/2015   INR 2.1 (H) 04/05/2015   INR 3.8 (H) 03/22/2015         Hypertension   Relevant Orders   Comprehensive metabolic panel (Completed)   Long term current use of anticoagulant therapy   Relevant  Orders   INR/PT (Completed)    Other Visit Diagnoses    Diabetes mellitus without complication ([place])    -  Primary   Relevant Orders   Hemoglobin A1c (Completed)   Microalbumin / creatinine urine ratio (Completed)      A total of 25 minutes of face to face time was spent with patient more than half of which was spent in counselling about the above mentioned conditions  and coordination of care   I have discontinued Mr. Roggenkamp omeprazole, traMADol, and traZODone. I am also having him maintain his fluocinonide-emollient, naproxen sodium, warfarin, hydrochlorothiazide, atorvastatin, metFORMIN, allopurinol, losartan, and warfarin.  No orders of the defined types were placed in this encounter.   Medications Discontinued During This Encounter  Medication Reason  . omeprazole (PRILOSEC OTC) 20 MG tablet No longer needed (for PRN medications)  . traZODone (DESYREL) 50 MG tablet Patient has not taken in last 30 days  . traMADol (ULTRAM) 50 MG tablet Patient Preference    Follow-up: No Follow-up on file.   Crecencio Mc, MD

## 2015-12-21 ENCOUNTER — Encounter: Payer: Self-pay | Admitting: Internal Medicine

## 2015-12-21 NOTE — Assessment & Plan Note (Signed)
Currently well-controlled on current medications .  hemoglobin A1c is at goal of less than 7.0 . Patient is reminded to schedule an annual eye exam and foot exam is normal today. Patient has  microalbuminuria. Patient is tolerating statin therapy for CAD risk reduction and on ACE/ARB for renal protection and hypertension .  Lab Results  Component Value Date   HGBA1C 6.4 12/18/2015   Lab Results  Component Value Date   MICROALBUR 0.9 12/18/2015    Lab Results  Component Value Date   CHOL 105 12/18/2015   HDL 29.90 (L) 12/18/2015   LDLCALC 35 09/10/2014   LDLDIRECT 41.0 12/18/2015   TRIG 324.0 (H) 12/18/2015   CHOLHDL 4 12/18/2015

## 2015-12-21 NOTE — Assessment & Plan Note (Signed)
LDL is now at goal since atorvastatin was prescribed in July for elevated LDL in setting of Type 2 DM and hypertension. Triglycerides are elevated as he is not fasting. LFTs are normal and he is tolerating therapy.   no changes today,   Lab Results  Component Value Date   CHOL 105 12/18/2015   HDL 29.90 (L) 12/18/2015   LDLCALC 35 09/10/2014   LDLDIRECT 41.0 12/18/2015   TRIG 324.0 (H) 12/18/2015   CHOLHDL 4 12/18/2015   Lab Results  Component Value Date   ALT 14 12/18/2015   AST 14 12/18/2015   ALKPHOS 42 12/18/2015   BILITOT 0.5 12/18/2015

## 2015-12-21 NOTE — Assessment & Plan Note (Signed)
At goal on current regimen.  No changes  Lab Results  Component Value Date   INR 2.4 (H) 12/18/2015   INR 2.1 (H) 04/05/2015   INR 3.8 (H) 03/22/2015

## 2016-01-07 IMAGING — CR DG HAND COMPLETE 3+V*R*
3 series · 3 of 3 positions shown · non-contrast
Comparison: None

CLINICAL DATA: BILATERAL hand pain and swelling for 3 week, pain at
lateral wrist, across palm, and in fourth and fifth digits of both
hands, polyarthritis

EXAM:
RIGHT HAND - COMPLETE 3+ VIEW

[view not recorded (1 of 3)]
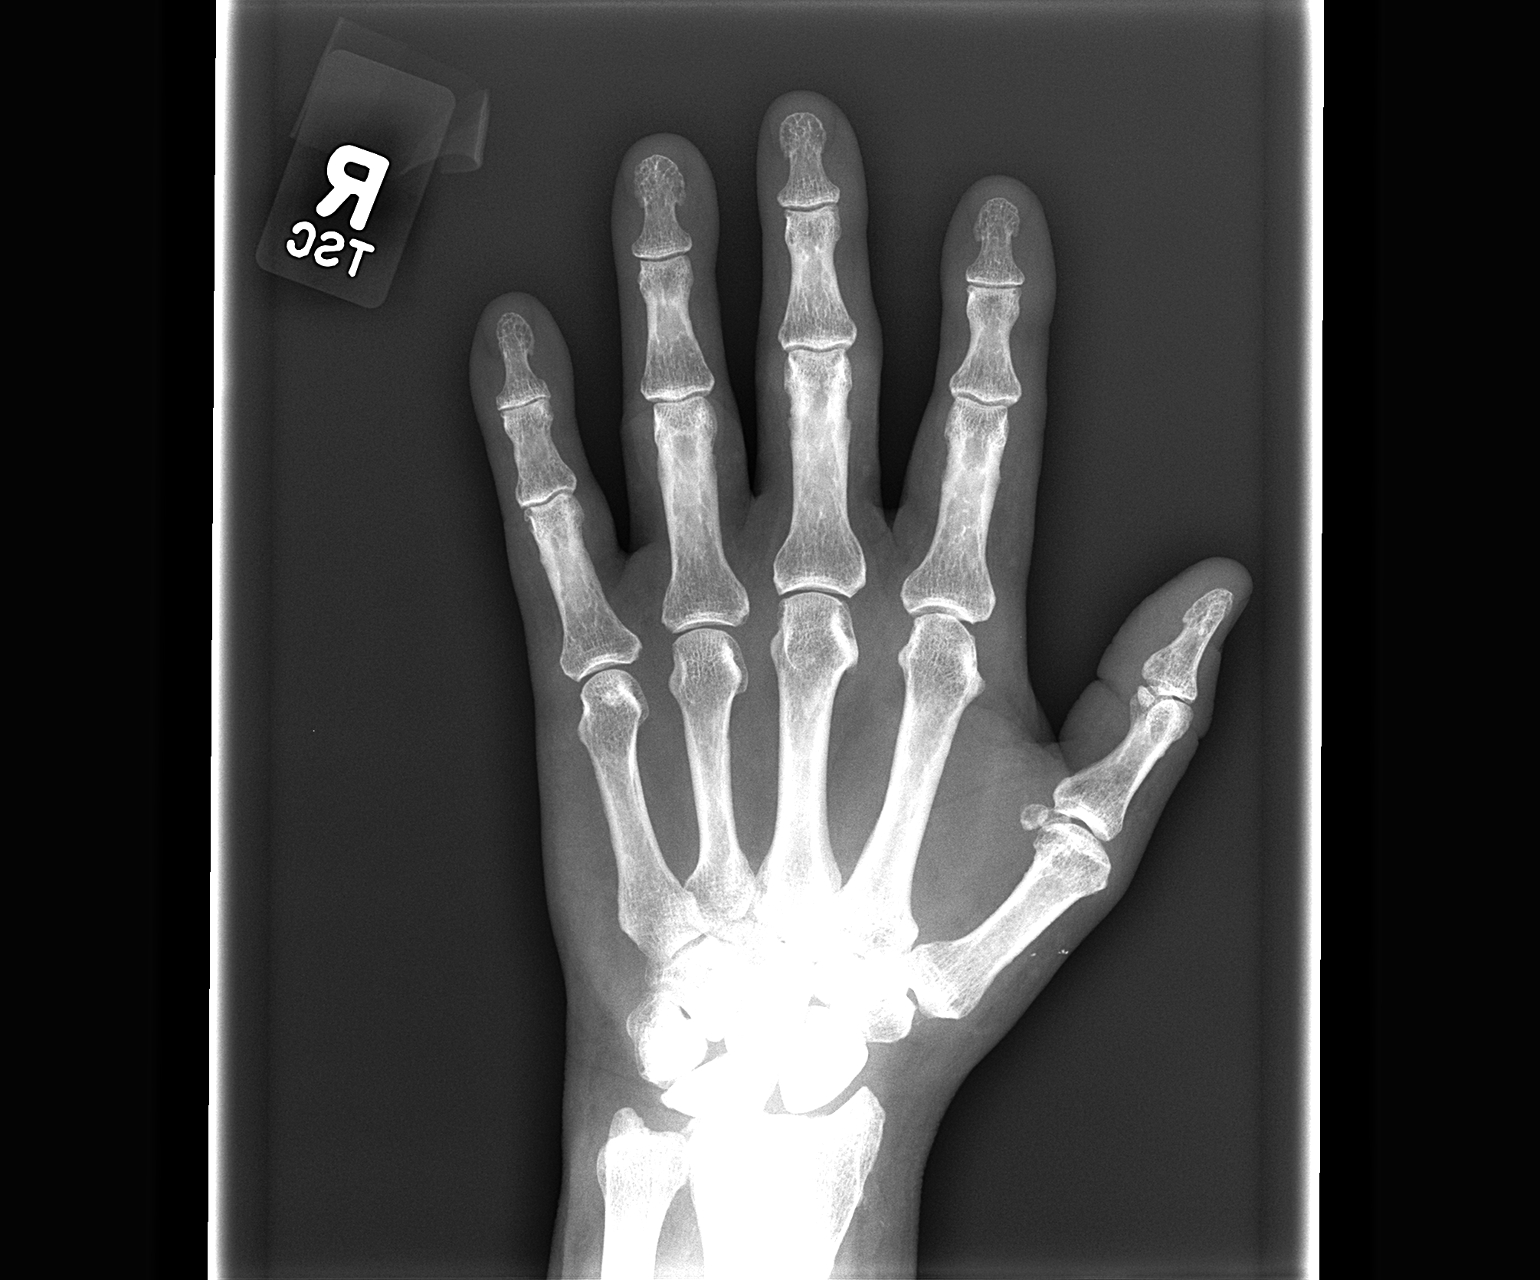

[view not recorded (2 of 3)]
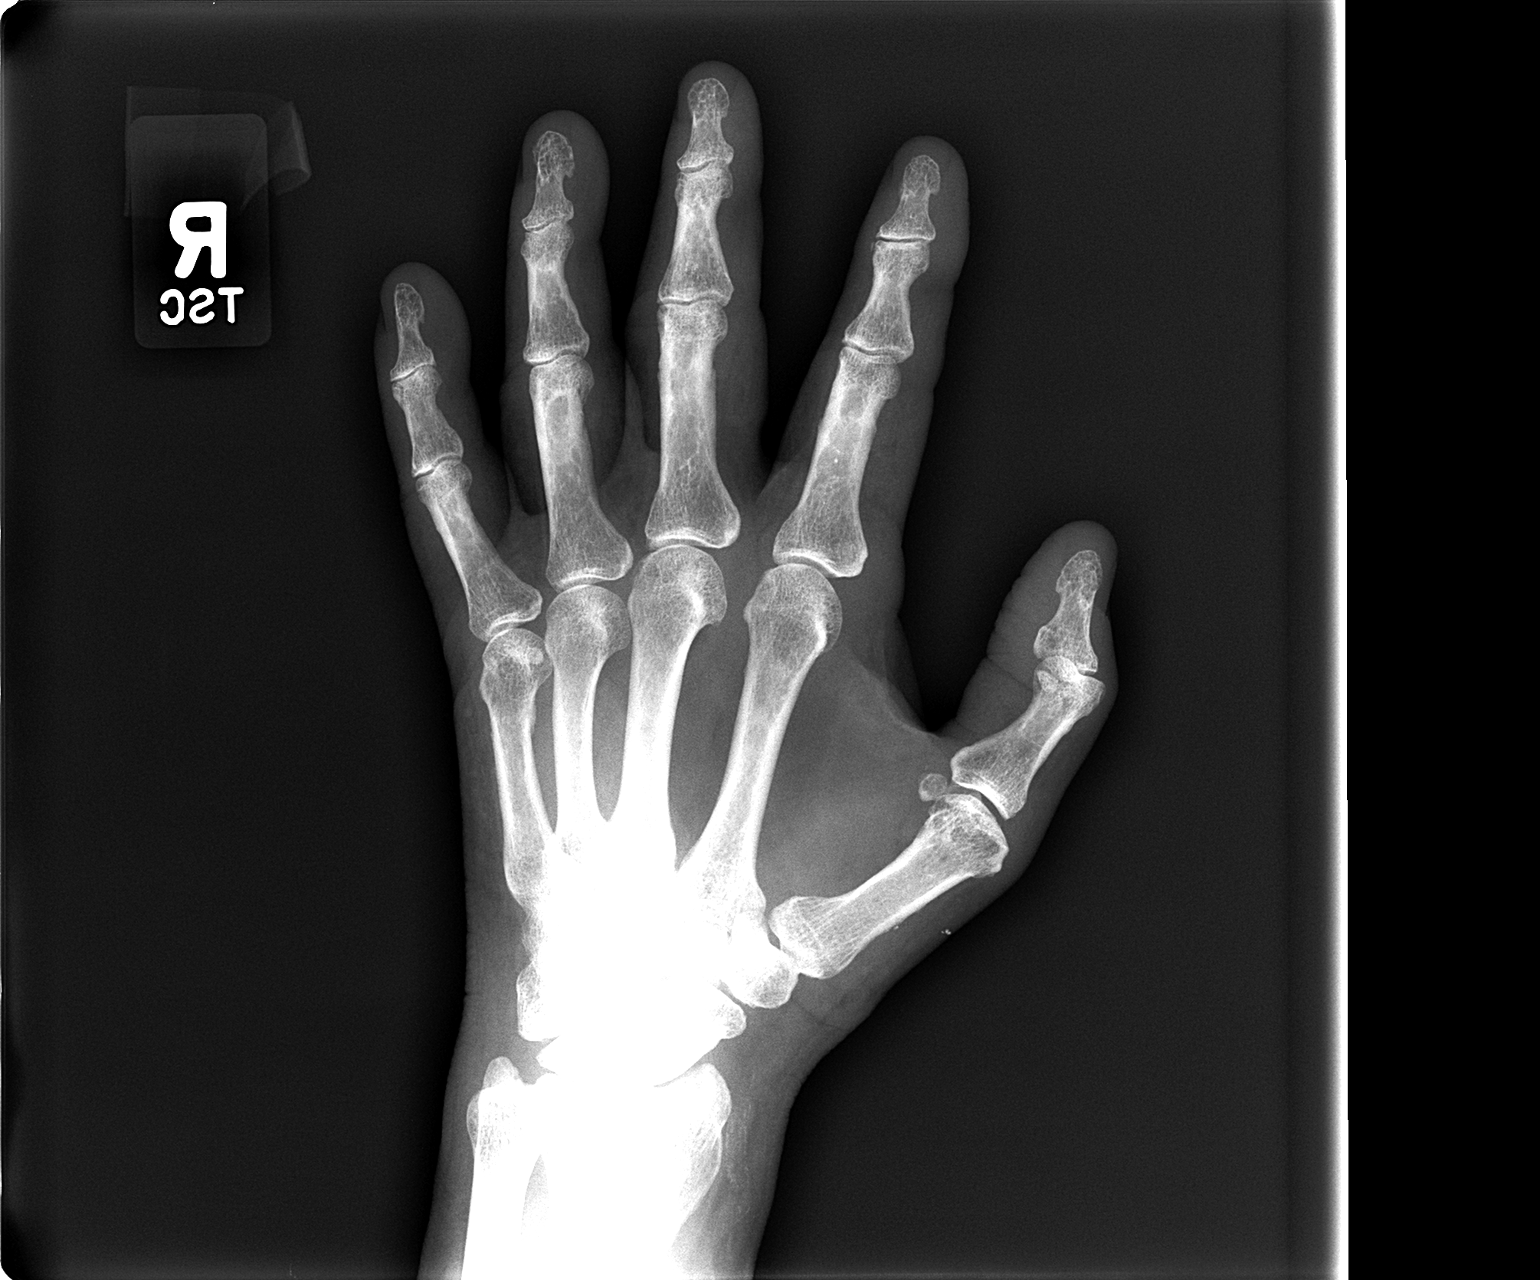

[view not recorded (3 of 3)]
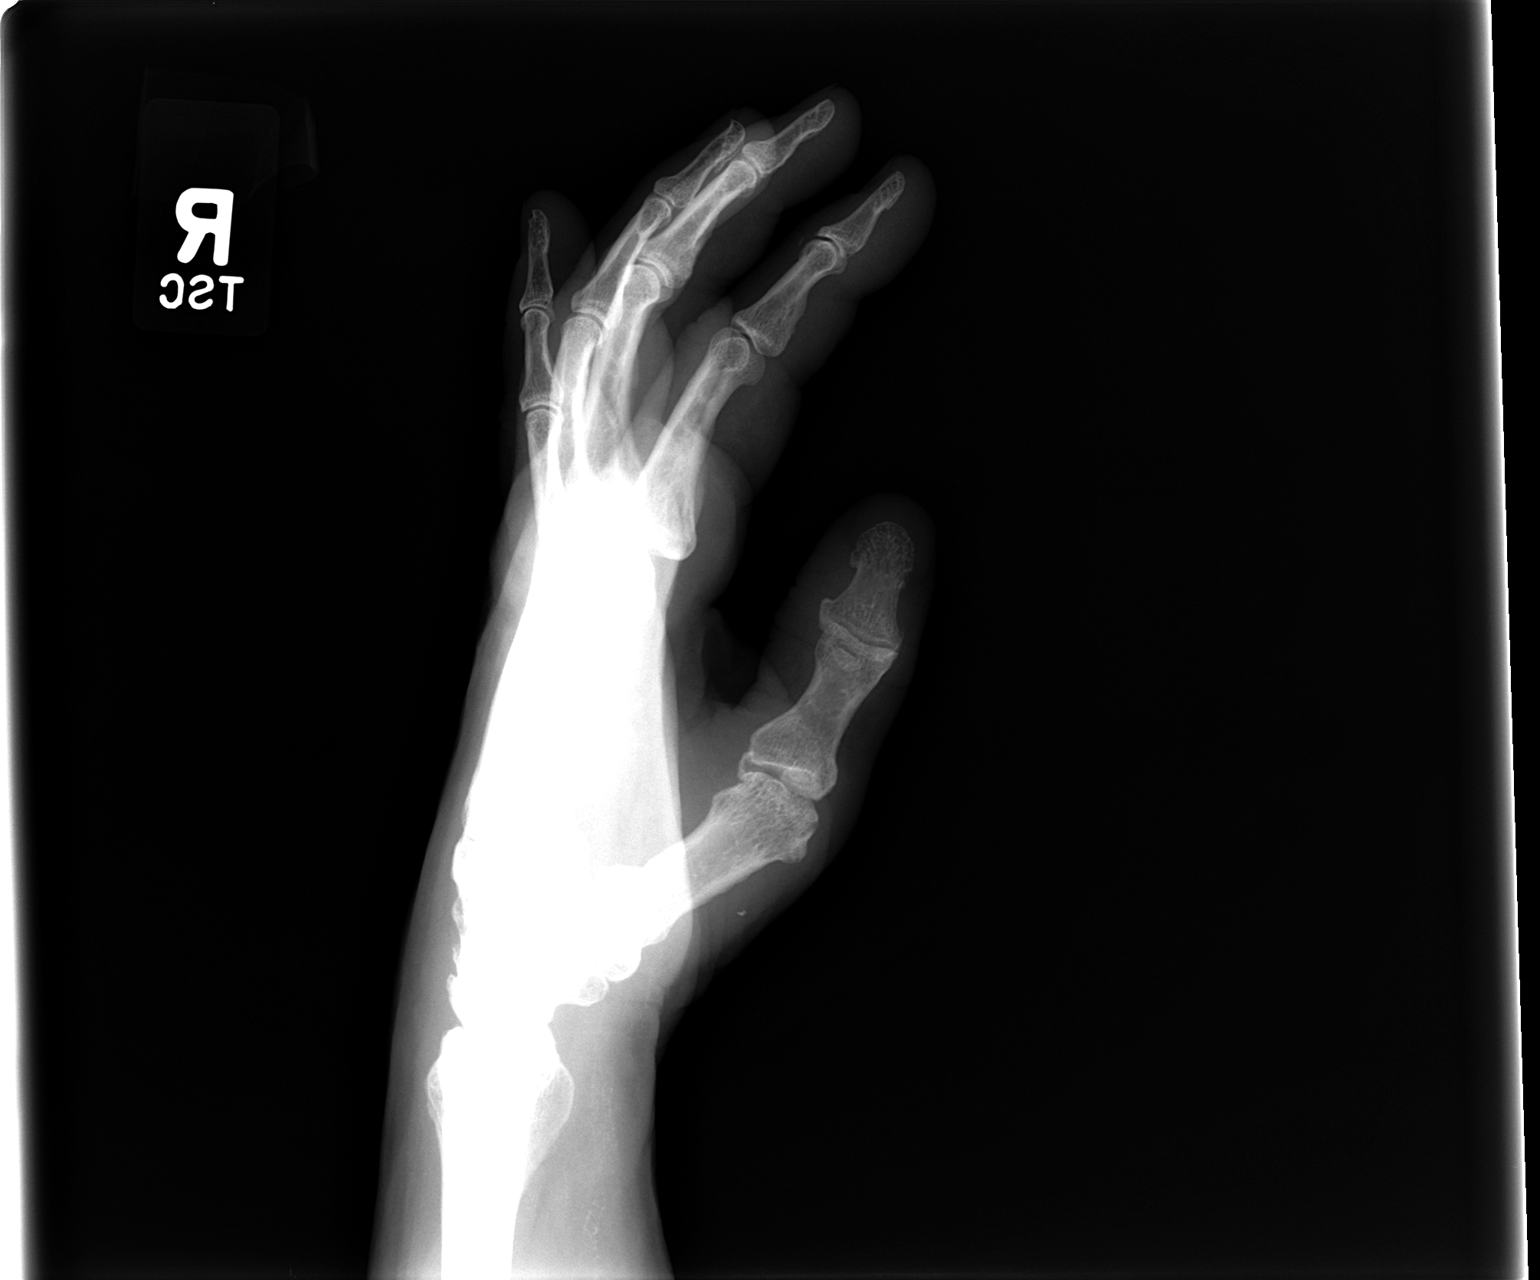

[3 of 3 positions shown; findings below may reference images not displayed]

FINDINGS: Osseous demineralization.

Joint spaces preserved.

No acute fracture, dislocation, or bone destruction.

No definite erosive or inflammatory changes.
IMPRESSION: No acute abnormalities.

Osseous demineralization diffusely.

## 2016-02-12 ENCOUNTER — Other Ambulatory Visit: Payer: Self-pay | Admitting: Internal Medicine

## 2016-02-12 ENCOUNTER — Telehealth: Payer: Self-pay | Admitting: Internal Medicine

## 2016-02-12 DIAGNOSIS — Z7901 Long term (current) use of anticoagulants: Secondary | ICD-10-CM

## 2016-02-12 NOTE — Telephone Encounter (Signed)
Coumadin refilled.  But he needs INR ASAP

## 2016-02-12 NOTE — Telephone Encounter (Signed)
Patient has not had INR since July please advise, medication refill for coumadin.

## 2016-02-13 NOTE — Telephone Encounter (Signed)
Patient scheduled for 10.15 02/14/16.

## 2016-02-14 ENCOUNTER — Other Ambulatory Visit (INDEPENDENT_AMBULATORY_CARE_PROVIDER_SITE_OTHER): Payer: Medicare Other

## 2016-02-14 DIAGNOSIS — Z7901 Long term (current) use of anticoagulants: Secondary | ICD-10-CM

## 2016-02-14 LAB — PROTIME-INR
INR: 1.7 ratio — ABNORMAL HIGH (ref 0.8–1.0)
Prothrombin Time: 18.2 s — ABNORMAL HIGH (ref 9.6–13.1)

## 2016-02-17 ENCOUNTER — Telehealth: Payer: Self-pay | Admitting: Internal Medicine

## 2016-02-17 NOTE — Telephone Encounter (Signed)
Pt called returning your call regarding results. Thank you!  Call pt @ [phone]

## 2016-02-18 NOTE — Telephone Encounter (Signed)
Left message to call office need coumadin regimen.

## 2016-02-19 NOTE — Telephone Encounter (Signed)
See results note. 

## 2016-02-29 ENCOUNTER — Other Ambulatory Visit: Payer: Self-pay | Admitting: Internal Medicine

## 2016-03-03 ENCOUNTER — Other Ambulatory Visit: Payer: Self-pay

## 2016-03-03 DIAGNOSIS — Z5181 Encounter for therapeutic drug level monitoring: Secondary | ICD-10-CM

## 2016-03-03 DIAGNOSIS — Z7901 Long term (current) use of anticoagulants: Principal | ICD-10-CM

## 2016-03-04 ENCOUNTER — Other Ambulatory Visit (INDEPENDENT_AMBULATORY_CARE_PROVIDER_SITE_OTHER): Payer: Medicare Other

## 2016-03-04 DIAGNOSIS — Z7901 Long term (current) use of anticoagulants: Secondary | ICD-10-CM

## 2016-03-04 DIAGNOSIS — Z5181 Encounter for therapeutic drug level monitoring: Secondary | ICD-10-CM | POA: Diagnosis not present

## 2016-03-04 LAB — PROTIME-INR
INR: 2.4 ratio — ABNORMAL HIGH (ref 0.8–1.0)
Prothrombin Time: 25.6 s — ABNORMAL HIGH (ref 9.6–13.1)

## 2016-03-11 ENCOUNTER — Encounter: Payer: Self-pay | Admitting: Internal Medicine

## 2016-03-11 ENCOUNTER — Ambulatory Visit (INDEPENDENT_AMBULATORY_CARE_PROVIDER_SITE_OTHER): Payer: Medicare Other | Admitting: Internal Medicine

## 2016-03-11 VITALS — BP 150/86 | HR 81 | Temp 97.9°F | Resp 12 | Ht 69.0 in | Wt 201.8 lb

## 2016-03-11 DIAGNOSIS — R5383 Other fatigue: Secondary | ICD-10-CM | POA: Diagnosis not present

## 2016-03-11 DIAGNOSIS — Z125 Encounter for screening for malignant neoplasm of prostate: Secondary | ICD-10-CM | POA: Diagnosis not present

## 2016-03-11 DIAGNOSIS — E785 Hyperlipidemia, unspecified: Secondary | ICD-10-CM

## 2016-03-11 DIAGNOSIS — Z23 Encounter for immunization: Secondary | ICD-10-CM

## 2016-03-11 DIAGNOSIS — I10 Essential (primary) hypertension: Secondary | ICD-10-CM | POA: Diagnosis not present

## 2016-03-11 DIAGNOSIS — E782 Mixed hyperlipidemia: Secondary | ICD-10-CM

## 2016-03-11 DIAGNOSIS — Z7901 Long term (current) use of anticoagulants: Secondary | ICD-10-CM

## 2016-03-11 DIAGNOSIS — D689 Coagulation defect, unspecified: Secondary | ICD-10-CM | POA: Diagnosis not present

## 2016-03-11 DIAGNOSIS — Z0000 Encounter for general adult medical examination without abnormal findings: Secondary | ICD-10-CM

## 2016-03-11 DIAGNOSIS — Z299 Encounter for prophylactic measures, unspecified: Secondary | ICD-10-CM

## 2016-03-11 DIAGNOSIS — E559 Vitamin D deficiency, unspecified: Secondary | ICD-10-CM | POA: Diagnosis not present

## 2016-03-11 DIAGNOSIS — M109 Gout, unspecified: Secondary | ICD-10-CM

## 2016-03-11 DIAGNOSIS — E1169 Type 2 diabetes mellitus with other specified complication: Secondary | ICD-10-CM

## 2016-03-11 LAB — PROTIME-INR
INR: 2.8 ratio — ABNORMAL HIGH (ref 0.8–1.0)
Prothrombin Time: 30.4 s — ABNORMAL HIGH (ref 9.6–13.1)

## 2016-03-11 LAB — CBC WITH DIFFERENTIAL/PLATELET
Basophils Absolute: 0 10*3/uL (ref 0.0–0.1)
Basophils Relative: 0.5 % (ref 0.0–3.0)
Eosinophils Absolute: 0.5 10*3/uL (ref 0.0–0.7)
Eosinophils Relative: 6 % — ABNORMAL HIGH (ref 0.0–5.0)
HCT: 39.5 % (ref 39.0–52.0)
Hemoglobin: 13.3 g/dL (ref 13.0–17.0)
Lymphocytes Relative: 25.4 % (ref 12.0–46.0)
Lymphs Abs: 2.1 10*3/uL (ref 0.7–4.0)
MCHC: 33.8 g/dL (ref 30.0–36.0)
MCV: 88.1 fl (ref 78.0–100.0)
Monocytes Absolute: 0.6 10*3/uL (ref 0.1–1.0)
Monocytes Relative: 7 % (ref 3.0–12.0)
Neutro Abs: 5 10*3/uL (ref 1.4–7.7)
Neutrophils Relative %: 61.1 % (ref 43.0–77.0)
Platelets: 258 10*3/uL (ref 150.0–400.0)
RBC: 4.48 Mil/uL (ref 4.22–5.81)
RDW: 14.2 % (ref 11.5–15.5)
WBC: 8.2 10*3/uL (ref 4.0–10.5)

## 2016-03-11 LAB — COMPREHENSIVE METABOLIC PANEL
ALT: 16 U/L (ref 0–53)
AST: 16 U/L (ref 0–37)
Albumin: 4.2 g/dL (ref 3.5–5.2)
Alkaline Phosphatase: 41 U/L (ref 39–117)
BUN: 33 mg/dL — ABNORMAL HIGH (ref 6–23)
CO2: 31 mEq/L (ref 19–32)
Calcium: 9.7 mg/dL (ref 8.4–10.5)
Chloride: 101 mEq/L (ref 96–112)
Creatinine, Ser: 1.41 mg/dL (ref 0.40–1.50)
GFR: 52.13 mL/min — ABNORMAL LOW (ref >60.00)
Glucose, Bld: 135 mg/dL — ABNORMAL HIGH (ref 70–99)
Potassium: 4.5 mEq/L (ref 3.5–5.1)
Sodium: 138 mEq/L (ref 135–145)
Total Bilirubin: 0.7 mg/dL (ref 0.2–1.2)
Total Protein: 6.9 g/dL (ref 6.0–8.3)

## 2016-03-11 LAB — LIPID PANEL
Cholesterol: 113 mg/dL (ref 0–200)
HDL: 33.5 mg/dL — ABNORMAL LOW (ref >39.00)
NonHDL: 79
Total CHOL/HDL Ratio: 3
Triglycerides: 338 mg/dL — ABNORMAL HIGH (ref 0.0–149.0)
VLDL: 67.6 mg/dL — ABNORMAL HIGH (ref 0.0–40.0)

## 2016-03-11 LAB — MICROALBUMIN / CREATININE URINE RATIO
Creatinine,U: 91.6 mg/dL
Microalb Creat Ratio: 4.7 mg/g (ref 0.0–30.0)
Microalb, Ur: 4.3 mg/dL — ABNORMAL HIGH (ref 0.0–1.9)

## 2016-03-11 LAB — LDL CHOLESTEROL, DIRECT: Direct LDL: 43 mg/dL

## 2016-03-11 LAB — TSH: TSH: 2.1 u[IU]/mL (ref 0.35–4.50)

## 2016-03-11 LAB — VITAMIN D 25 HYDROXY (VIT D DEFICIENCY, FRACTURES): VITD: 31.98 ng/mL (ref 30.00–100.00)

## 2016-03-11 LAB — URIC ACID: Uric Acid, Serum: 7.9 mg/dL — ABNORMAL HIGH (ref 4.0–7.8)

## 2016-03-11 LAB — PSA, MEDICARE: PSA: 1.43 ng/ml (ref 0.10–4.00)

## 2016-03-11 MED ORDER — GUAIFENESIN-CODEINE 100-10 MG/5ML PO SYRP: 5.0000 mL | ORAL_SOLUTION | Freq: Three times a day (TID) | ORAL | 0 refills | Status: DC | PRN

## 2016-03-11 MED ORDER — FLUOCINONIDE-E 0.05 % EX CREA: 1.0000 "application " | TOPICAL_CREAM | Freq: Two times a day (BID) | CUTANEOUS | 2 refills | Status: DC

## 2016-03-11 NOTE — Progress Notes (Signed)
Pre-visit discussion using our clinic review tool. No additional management support is needed unless otherwise documented below in the visit note.  

## 2016-03-11 NOTE — Progress Notes (Signed)
Patient ID: Stephen Kelly, male    DOB: 04/25/1941  Age: 76 y.o. MRN: [id number]  The patient is here for annual Medicare wellness examination and management of other chronic and acute problems.   Wife [name] passed away one month ago from Multiple myeloma Granddaughter staying with him on weekends  Has resumed some yardwork.  No joint pain  Has gained weight .  Cooking a lot.  Sleeping well most nights.   colonoscopy 2009 normal   The risk factors are reflected in the social history.  The roster of all physicians providing medical care to patient - is listed in the Snapshot section of the chart.  Activities of daily living:  The patient is 100% independent in all ADLs: dressing, toileting, feeding as well as independent mobility  Home safety : The patient has smoke detectors in the home. They wear seatbelts.  There are no firearms at home. There is no violence in the home.   There is no risks for hepatitis, STDs or HIV. There is no   history of blood transfusion. They have no travel history to infectious disease endemic areas of the world.  The patient has seen their dentist in the last six month. They have seen their eye doctor in the last year. They admit to slight hearing difficulty with regard to whispered voices and some television programs.  They have deferred audiologic testing in the last year.  They do not  have excessive sun exposure. Discussed the need for sun protection: hats, long sleeves and use of sunscreen if there is significant sun exposure.   Diet: the importance of a healthy diet is discussed. They do have a healthy diet.  The benefits of regular aerobic exercise were discussed. He is not exercisigng but has resume doing yardwork.   Depression screen: there are no signs or vegative symptoms of depression- irritability, change in appetite, anhedonia, sadness/tearfullness, but he is grieving the loss of his beloved wife. .  Cognitive assessment: the patient manages  all their financial and personal affairs and is actively engaged. They could relate day,date,year and events; recalled 2/3 objects at 3 minutes; performed clock-face test normally.  During the course of the visit , End of Life objectives were discussed at length,  Patient does not have a living will in place or a healthcare power of attorney.  he was given printed information about advance directives and encouraged to return after discussing with his son     The following portions of the patient's history were reviewed and updated as appropriate: allergies, current medications, past family history, past medical history,  past surgical history, past social history  and problem list.  Visual acuity was not assessed per patient preference since she has regular follow up with her ophthalmologist. Hearing and body mass index were assessed and reviewed.   During the course of the visit the patient was educated and counseled about appropriate screening and preventive services including : fall prevention , diabetes screening, nutrition counseling, colorectal cancer screening, and recommended immunizations.    CC: The primary encounter diagnosis was Gout, arthritis. Diagnoses of Encounter for immunization, Clotting disorder ([place]), Essential hypertension, Mixed hyperlipidemia, Long term current use of anticoagulant therapy, Prostate cancer screening, Hyperlipidemia associated with type 2 diabetes mellitus ([place]), Fatigue, unspecified type, Vitamin D deficiency, Encounter for preventive measure, and Anticoagulation monitoring, INR range 2-3 were also pertinent to this visit.  History Richey has a past medical history of Clotting disorder ([place]) (2011); Gout, arthritis; History of renal  calculi (2001); Hyperlipidemia; Hypertension; 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[place]); Patent foramen ovale; and Pulmonary nodule, right (2011).   He has a past surgical history that includes Thrombectomy.   His family history  includes Cancer in his mother; Diabetes in his mother; Heart disease in his mother; Hypertension in his mother; Mental illness in his father; Stroke in his paternal grandmother.He reports that he has never smoked. He has never used smokeless tobacco. He reports that he does not drink alcohol or use drugs.  Outpatient Medications Prior to Visit  Medication Sig Dispense Refill  . allopurinol (ZYLOPRIM) 100 MG tablet TAKE ONE (1) TABLET BY MOUTH EVERY DAY 90 tablet 2  . atorvastatin (LIPITOR) 20 MG tablet TAKE ONE (1) TABLET EACH DAY 90 tablet 3  . hydrochlorothiazide (HYDRODIURIL) 25 MG tablet TAKE ONE (1) TABLET EACH DAY 90 tablet 0  . losartan (COZAAR) 100 MG tablet TAKE ONE TABLET BY MOUTH EVERY DAY 90 tablet 2  . metFORMIN (GLUCOPHAGE) 850 MG tablet TAKE ONE (1) TABLET BY MOUTH TWO (2) TIMES DAILY WITH FOOD 60 tablet 2  . naproxen sodium (ANAPROX) 220 MG tablet Take 220 mg by mouth 2 (two) times daily with a meal.    . warfarin (COUMADIN) 2 MG tablet TAKE ONE (1) TABLET BY MOUTH EVERY DAY 30 tablet 2  . warfarin (COUMADIN) 3 MG tablet TAKE ONE (1) TABLET BY MOUTH EVERY DAY 30 tablet 1  . fluocinonide-emollient (LIDEX-E) 0.05 % cream Apply 1 application topically 2 (two) times daily. 60 g 2   No facility-administered medications prior to visit.     Review of Systems   Patient denies headache, fevers, malaise, unintentional weight loss, skin rash, eye pain, sinus congestion and sinus pain, sore throat, dysphagia,  hemoptysis , cough, dyspnea, wheezing, chest pain, palpitations, orthopnea, edema, abdominal pain, nausea, melena, diarrhea, constipation, flank pain, dysuria, hematuria, urinary  Frequency, nocturia, numbness, tingling, seizures,  Focal weakness, Loss of consciousness,  Tremor, insomnia, depression, anxiety, and suicidal ideation.      Objective:  BP (!) 150/86   Pulse 81   Temp 97.9 F (36.6 C) (Oral)   Resp 12   Ht '5\' 9"'  (1.753 m)   Wt 201 lb 12 oz (91.5 kg)   SpO2 97%    BMI 29.79 kg/m   Physical Exam  General appearance: alert, cooperative and appears stated age Ears: normal TM's and external ear canals both ears Throat: lips, mucosa, and tongue normal; teeth and gums normal Neck: no adenopathy, no carotid bruit, supple, symmetrical, trachea midline and thyroid not enlarged, symmetric, no tenderness/mass/nodules Back: symmetric, no curvature. ROM normal. No CVA tenderness. Lungs: clear to auscultation bilaterally Heart: regular rate and rhythm, S1, S2 normal, no murmur, click, rub or gallop Abdomen: soft, non-tender; bowel sounds normal; no masses,  no organomegaly Pulses: 2+ and symmetric Skin: Skin color, texture, turgor normal. No rashes or lesions Lymph nodes: Cervical, supraclavicular, and axillary nodes normal.   Assessment & Plan:   Problem List Items Addressed This Visit    Encounter for preventive measure    Annual comprehensive preventive exam was done as well as an evaluation and management of acute and chronic conditions .  During the course of the visit the patient was educated and counseled about appropriate screening and preventive services including :  diabetes screening, lipid analysis with projected  10 year  risk for CAD , nutrition counseling, prostate and colorectal cancer screening, and recommended immunizations.  Printed recommendations for health maintenance screenings was given.  Anticoagulation monitoring, INR range 2-3    At goal on current regimen.  No changes  Lab Results  Component Value Date   INR 2.8 (H) 03/11/2016   INR 2.4 (H) 03/04/2016   INR 1.7 (H) 02/14/2016         Hyperlipidemia associated with type 2 diabetes mellitus ([place])   Relevant Orders   Lipid panel (Completed)   Gout, arthritis - Primary   Relevant Orders   Uric acid (Completed)   Hypertension   Relevant Orders   Comprehensive metabolic panel (Completed)   Microalbumin / creatinine urine ratio (Completed)   Long term current use of  anticoagulant therapy   Relevant Orders   CBC with Differential/Platelet (Completed)   Protime-INR (Completed)   Hyperlipidemia   Clotting disorder ([place])    Other Visit Diagnoses    Encounter for immunization       Relevant Orders   Flu vaccine HIGH DOSE PF (Completed)   Prostate cancer screening       Relevant Orders   PSA, Medicare (Completed)   Fatigue, unspecified type       Relevant Orders   TSH (Completed)   Vitamin D deficiency       Relevant Orders   VITAMIN D 25 Hydroxy (Vit-D Deficiency, Fractures) (Completed)      I am having Mr. Tungate start on guaiFENesin-codeine. I am also having him maintain his naproxen sodium, atorvastatin, allopurinol, losartan, warfarin, hydrochlorothiazide, warfarin, metFORMIN, and fluocinonide-emollient.  Meds ordered this encounter  Medications  . fluocinonide-emollient (LIDEX-E) 0.05 % cream    Sig: Apply 1 application topically 2 (two) times daily.    Dispense:  60 g    Refill:  2  . guaiFENesin-codeine (CHERATUSSIN AC) 100-10 MG/5ML syrup    Sig: Take 5 mLs by mouth 3 (three) times daily as needed for cough.    Dispense:  180 mL    Refill:  0    Medications Discontinued During This Encounter  Medication Reason  . fluocinonide-emollient (LIDEX-E) 0.05 % cream Reorder    Follow-up: Return in about 3 months (around 06/11/2016) for follow up diabetes.   Crecencio Mc, MD

## 2016-03-11 NOTE — Patient Instructions (Signed)
Your blood pressure was elevated today  Please have it rechecked at your pharmacy this week and let me know if it is > 140/80  Health Maintenance, Male A healthy lifestyle and preventative care can promote health and wellness.  Maintain regular health, dental, and eye exams.  Eat a healthy diet. Foods like vegetables, fruits, whole grains, low-fat dairy products, and lean protein foods contain the nutrients you need and are low in calories. Decrease your intake of foods high in solid fats, added sugars, and salt. Get information about a proper diet from your health care provider, if necessary.  Regular physical exercise is one of the most important things you can do for your health. Most adults should get at least 150 minutes of moderate-intensity exercise (any activity that increases your heart rate and causes you to sweat) each week. In addition, most adults need muscle-strengthening exercises on 2 or more days a week.   Maintain a healthy weight. The body mass index (BMI) is a screening tool to identify possible weight problems. It provides an estimate of body fat based on height and weight. Your health care provider can find your BMI and can help you achieve or maintain a healthy weight. For males 20 years and older:  A BMI below 18.5 is considered underweight.  A BMI of 18.5 to 24.9 is normal.  A BMI of 25 to 29.9 is considered overweight.  A BMI of 30 and above is considered obese.  Maintain normal blood lipids and cholesterol by exercising and minimizing your intake of saturated fat. Eat a balanced diet with plenty of fruits and vegetables. Blood tests for lipids and cholesterol should begin at age 74 and be repeated every 5 years. If your lipid or cholesterol levels are high, you are over age 74, or you are at high risk for heart disease, you may need your cholesterol levels checked more frequently.Ongoing high lipid and cholesterol levels should be treated with medicines if diet and  exercise are not working.  If you smoke, find out from your health care provider how to quit. If you do not use tobacco, do not start.  Lung cancer screening is recommended for adults aged 55-80 years who are at high risk for developing lung cancer because of a history of smoking. A yearly low-dose CT scan of the lungs is recommended for people who have at least a 30-pack-year history of smoking and are current smokers or have quit within the past 15 years. A pack year of smoking is smoking an average of 1 pack of cigarettes a day for 1 year (for example, a 30-pack-year history of smoking could mean smoking 1 pack a day for 30 years or 2 packs a day for 15 years). Yearly screening should continue until the smoker has stopped smoking for at least 15 years. Yearly screening should be stopped for people who develop a health problem that would prevent them from having lung cancer treatment.  If you choose to drink alcohol, do not have more than 2 drinks per day. One drink is considered to be 12 oz (360 mL) of beer, 5 oz (150 mL) of wine, or 1.5 oz (45 mL) of liquor.  Avoid the use of street drugs. Do not share needles with anyone. Ask for help if you need support or instructions about stopping the use of drugs.  High blood pressure causes heart disease and increases the risk of stroke. High blood pressure is more likely to develop in:  People who have  blood pressure in the end of the normal range (100-139/85-89 mm Hg).  People who are overweight or obese.  People who are African American.  If you are 27-60 years of age, have your blood pressure checked every 3-5 years. If you are 25 years of age or older, have your blood pressure checked every year. You should have your blood pressure measured twice--once when you are at a hospital or clinic, and once when you are not at a hospital or clinic. Record the average of the two measurements. To check your blood pressure when you are not at a hospital or  clinic, you can use:  An automated blood pressure machine at a pharmacy.  A home blood pressure monitor.  If you are 32-67 years old, ask your health care provider if you should take aspirin to prevent heart disease.  Diabetes screening involves taking a blood sample to check your fasting blood sugar level. This should be done once every 3 years after age 15 if you are at a normal weight and without risk factors for diabetes. Testing should be considered at a younger age or be carried out more frequently if you are overweight and have at least 1 risk factor for diabetes.  Colorectal cancer can be detected and often prevented. Most routine colorectal cancer screening begins at the age of 64 and continues through age 74. However, your health care provider may recommend screening at an earlier age if you have risk factors for colon cancer. On a yearly basis, your health care provider may provide home test kits to check for hidden blood in the stool. A small camera at the end of a tube may be used to directly examine the colon (sigmoidoscopy or colonoscopy) to detect the earliest forms of colorectal cancer. Talk to your health care provider about this at age 48 when routine screening begins. A direct exam of the colon should be repeated every 5-10 years through age 20, unless early forms of precancerous polyps or small growths are found.  People who are at an increased risk for hepatitis B should be screened for this virus. You are considered at high risk for hepatitis B if:  You were born in a country where hepatitis B occurs often. Talk with your health care provider about which countries are considered high risk.  Your parents were born in a high-risk country and you have not received a shot to protect against hepatitis B (hepatitis B vaccine).  You have HIV or AIDS.  You use needles to inject street drugs.  You live with, or have sex with, someone who has hepatitis B.  You are a man who has  sex with other men (MSM).  You get hemodialysis treatment.  You take certain medicines for conditions like cancer, organ transplantation, and autoimmune conditions.  Hepatitis C blood testing is recommended for all people born from [birth year] through 1964 and any individual with known risk factors for hepatitis C.  Healthy men should no longer receive prostate-specific antigen (PSA) blood tests as part of routine cancer screening. Talk to your health care provider about prostate cancer screening.  Testicular cancer screening is not recommended for adolescents or adult males who have no symptoms. Screening includes self-exam, a health care provider exam, and other screening tests. Consult with your health care provider about any symptoms you have or any concerns you have about testicular cancer.  Practice safe sex. Use condoms and avoid high-risk sexual practices to reduce the spread of sexually transmitted infections (  STIs).  You should be screened for STIs, including gonorrhea and chlamydia if:  You are sexually active and are younger than 24 years.  You are older than 24 years, and your health care provider tells you that you are at risk for this type of infection.  Your sexual activity has changed since you were last screened, and you are at an increased risk for chlamydia or gonorrhea. Ask your health care provider if you are at risk.  If you are at risk of being infected with HIV, it is recommended that you take a prescription medicine daily to prevent HIV infection. This is called pre-exposure prophylaxis (PrEP). You are considered at risk if:  You are a man who has sex with other men (MSM).  You are a heterosexual man who is sexually active with multiple partners.  You take drugs by injection.  You are sexually active with a partner who has HIV.  Talk with your health care provider about whether you are at high risk of being infected with HIV. If you choose to begin PrEP, you should  first be tested for HIV. You should then be tested every 3 months for as long as you are taking PrEP.  Use sunscreen. Apply sunscreen liberally and repeatedly throughout the day. You should seek shade when your shadow is shorter than you. Protect yourself by wearing long sleeves, pants, a wide-brimmed hat, and sunglasses year round whenever you are outdoors.  Tell your health care provider of new moles or changes in moles, especially if there is a change in shape or color. Also, tell your health care provider if a mole is larger than the size of a pencil eraser.  A one-time screening for abdominal aortic aneurysm (AAA) and surgical repair of large AAAs by ultrasound is recommended for men aged 49-75 years who are current or former smokers.  Stay current with your vaccines (immunizations).   This information is not intended to replace advice given to you by your health care provider. Make sure you discuss any questions you have with your health care provider.   Document Released: 11/07/2007 Document Revised: 06/01/2014 Document Reviewed: 10/06/2010 Elsevier Interactive Patient Education [employer].

## 2016-03-13 ENCOUNTER — Telehealth: Payer: Self-pay | Admitting: Internal Medicine

## 2016-03-13 NOTE — Telephone Encounter (Signed)
BP looks good,  No changes needed.

## 2016-03-13 NOTE — Assessment & Plan Note (Signed)
Currently well-controlled on current medications .  hemoglobin A1c is at goal of less than 7.0 . Patient is reminded to schedule an annual eye exam and foot exam is normal today. Patient has  microalbuminuria. Patient is tolerating statin therapy for CAD risk reduction and on ACE/ARB for renal protection and hypertension .  Lab Results  Component Value Date   HGBA1C 6.4 12/18/2015   Lab Results  Component Value Date   MICROALBUR 4.3 (H) 03/11/2016    Lab Results  Component Value Date   CHOL 113 03/11/2016   HDL 33.50 (L) 03/11/2016   LDLCALC 35 09/10/2014   LDLDIRECT 43.0 03/11/2016   TRIG 338.0 (H) 03/11/2016   CHOLHDL 3 03/11/2016

## 2016-03-13 NOTE — Telephone Encounter (Signed)
Left message for patient to return call back.  

## 2016-03-13 NOTE — Assessment & Plan Note (Addendum)

## 2016-03-13 NOTE — Telephone Encounter (Signed)
Pt came into office to give BP readings. On Thurs, Oct. 19 BP was 122 / 75. Eye exam is scheduled for 11/3.

## 2016-03-13 NOTE — Assessment & Plan Note (Signed)
At goal on current regimen.  No changes  Lab Results  Component Value Date   INR 2.8 (H) 03/11/2016   INR 2.4 (H) 03/04/2016   INR 1.7 (H) 02/14/2016

## 2016-03-13 NOTE — Telephone Encounter (Signed)
Patient was given Dr Melina Schoolsullo's statement

## 2016-03-15 ENCOUNTER — Other Ambulatory Visit: Payer: Self-pay | Admitting: Internal Medicine

## 2016-03-15 DIAGNOSIS — E1169 Type 2 diabetes mellitus with other specified complication: Secondary | ICD-10-CM

## 2016-03-15 DIAGNOSIS — E785 Hyperlipidemia, unspecified: Secondary | ICD-10-CM

## 2016-03-15 DIAGNOSIS — E782 Mixed hyperlipidemia: Secondary | ICD-10-CM

## 2016-03-15 DIAGNOSIS — E119 Type 2 diabetes mellitus without complications: Secondary | ICD-10-CM

## 2016-03-15 DIAGNOSIS — M109 Gout, unspecified: Secondary | ICD-10-CM

## 2016-03-15 MED ORDER — ALLOPURINOL 100 MG PO TABS: 150.0000 mg | ORAL_TABLET | Freq: Every day | ORAL | 2 refills | Status: DC

## 2016-03-15 NOTE — Progress Notes (Signed)
A1

## 2016-03-18 ENCOUNTER — Telehealth: Payer: Self-pay | Admitting: *Deleted

## 2016-03-18 NOTE — Telephone Encounter (Signed)
Please give a time and day to schedule pt for a follow up

## 2016-03-18 NOTE — Telephone Encounter (Signed)
27 @3 .30 Please call patient. I have added to schedule.

## 2016-03-27 DIAGNOSIS — E119 Type 2 diabetes mellitus without complications: Secondary | ICD-10-CM | POA: Diagnosis not present

## 2016-03-27 LAB — HM DIABETES EYE EXAM

## 2016-03-31 ENCOUNTER — Ambulatory Visit (INDEPENDENT_AMBULATORY_CARE_PROVIDER_SITE_OTHER): Payer: Medicare Other

## 2016-03-31 ENCOUNTER — Other Ambulatory Visit: Payer: Medicare Other

## 2016-03-31 VITALS — BP 120/78 | HR 82 | Temp 98.4°F | Resp 14 | Ht 69.0 in | Wt 204.0 lb

## 2016-03-31 DIAGNOSIS — Z0000 Encounter for general adult medical examination without abnormal findings: Secondary | ICD-10-CM

## 2016-03-31 NOTE — Patient Instructions (Addendum)
[name] , Thank you for taking time to come for your Medicare Wellness Visit. I appreciate your ongoing commitment to your health goals. Please review the following plan we discussed and let me know if I can assist you in the future.   FOLLOW UP WITH DR. Darrick HuntsmanULLO AS NEEDED.  These are the goals we discussed: Goals    . Increase physical activity          Walk at the park for 3 days a week for 30 minutes       This is a list of the screening recommended for you and due dates:  Health Maintenance  Topic Date Due  . Hemoglobin A1C  06/19/2016  . Complete foot exam   12/18/2016  . Eye exam for diabetics  03/24/2017  . Colon Cancer Screening  01/31/2018  . Tetanus Vaccine  04/28/2021  . Flu Shot  Completed  . Shingles Vaccine  Completed  . Pneumonia vaccines  Completed   Health Maintenance, Male A healthy lifestyle and preventative care can promote health and wellness.  Maintain regular health, dental, and eye exams.  Eat a healthy diet. Foods like vegetables, fruits, whole grains, low-fat dairy products, and lean protein foods contain the nutrients you need and are low in calories. Decrease your intake of foods high in solid fats, added sugars, and salt. Get information about a proper diet from your health care provider, if necessary.  Regular physical exercise is one of the most important things you can do for your health. Most adults should get at least 150 minutes of moderate-intensity exercise (any activity that increases your heart rate and causes you to sweat) each week. In addition, most adults need muscle-strengthening exercises on 2 or more days a week.   Maintain a healthy weight. The body mass index (BMI) is a screening tool to identify possible weight problems. It provides an estimate of body fat based on height and weight. Your health care provider can find your BMI and can help you achieve or maintain a healthy weight. For males 20 years and older:  A BMI below 18.5 is  considered underweight.  A BMI of 18.5 to 24.9 is normal.  A BMI of 25 to 29.9 is considered overweight.  A BMI of 30 and above is considered obese.  Maintain normal blood lipids and cholesterol by exercising and minimizing your intake of saturated fat. Eat a balanced diet with plenty of fruits and vegetables. Blood tests for lipids and cholesterol should begin at age 74 and be repeated every 5 years. If your lipid or cholesterol levels are high, you are over age 74, or you are at high risk for heart disease, you may need your cholesterol levels checked more frequently.Ongoing high lipid and cholesterol levels should be treated with medicines if diet and exercise are not working.  If you smoke, find out from your health care provider how to quit. If you do not use tobacco, do not start.  Lung cancer screening is recommended for adults aged 55-80 years who are at high risk for developing lung cancer because of a history of smoking. A yearly low-dose CT scan of the lungs is recommended for people who have at least a 30-pack-year history of smoking and are current smokers or have quit within the past 15 years. A pack year of smoking is smoking an average of 1 pack of cigarettes a day for 1 year (for example, a 30-pack-year history of smoking could mean smoking 1 pack a  day for 30 years or 2 packs a day for 15 years). Yearly screening should continue until the smoker has stopped smoking for at least 15 years. Yearly screening should be stopped for people who develop a health problem that would prevent them from having lung cancer treatment.  If you choose to drink alcohol, do not have more than 2 drinks per day. One drink is considered to be 12 oz (360 mL) of beer, 5 oz (150 mL) of wine, or 1.5 oz (45 mL) of liquor.  Avoid the use of street drugs. Do not share needles with anyone. Ask for help if you need support or instructions about stopping the use of drugs.  High blood pressure causes heart  disease and increases the risk of stroke. High blood pressure is more likely to develop in:  People who have blood pressure in the end of the normal range (100-139/85-89 mm Hg).  People who are overweight or obese.  People who are African American.  If you are 6318-74 years of age, have your blood pressure checked every 3-5 years. If you are 74 years of age or older, have your blood pressure checked every year. You should have your blood pressure measured twice--once when you are at a hospital or clinic, and once when you are not at a hospital or clinic. Record the average of the two measurements. To check your blood pressure when you are not at a hospital or clinic, you can use:  An automated blood pressure machine at a pharmacy.  A home blood pressure monitor.  If you are 3945-74 years old, ask your health care provider if you should take aspirin to prevent heart disease.  Diabetes screening involves taking a blood sample to check your fasting blood sugar level. This should be done once every 3 years after age 74 if you are at a normal weight and without risk factors for diabetes. Testing should be considered at a younger age or be carried out more frequently if you are overweight and have at least 1 risk factor for diabetes.  Colorectal cancer can be detected and often prevented. Most routine colorectal cancer screening begins at the age of 74 and continues through age 74. However, your health care provider may recommend screening at an earlier age if you have risk factors for colon cancer. On a yearly basis, your health care provider may provide home test kits to check for hidden blood in the stool. A small camera at the end of a tube may be used to directly examine the colon (sigmoidoscopy or colonoscopy) to detect the earliest forms of colorectal cancer. Talk to your health care provider about this at age 74 when routine screening begins. A direct exam of the colon should be repeated every 5-10  years through age 74, unless early forms of precancerous polyps or small growths are found.  People who are at an increased risk for hepatitis B should be screened for this virus. You are considered at high risk for hepatitis B if:  You were born in a country where hepatitis B occurs often. Talk with your health care provider about which countries are considered high risk.  Your parents were born in a high-risk country and you have not received a shot to protect against hepatitis B (hepatitis B vaccine).  You have HIV or AIDS.  You use needles to inject street drugs.  You live with, or have sex with, someone who has hepatitis B.  You are a man who has  sex with other men (MSM).  You get hemodialysis treatment.  You take certain medicines for conditions like cancer, organ transplantation, and autoimmune conditions.  Hepatitis C blood testing is recommended for all people born from [birth year] through 1964 and any individual with known risk factors for hepatitis C.  Healthy men should no longer receive prostate-specific antigen (PSA) blood tests as part of routine cancer screening. Talk to your health care provider about prostate cancer screening.  Testicular cancer screening is not recommended for adolescents or adult males who have no symptoms. Screening includes self-exam, a health care provider exam, and other screening tests. Consult with your health care provider about any symptoms you have or any concerns you have about testicular cancer.  Practice safe sex. Use condoms and avoid high-risk sexual practices to reduce the spread of sexually transmitted infections (STIs).  You should be screened for STIs, including gonorrhea and chlamydia if:  You are sexually active and are younger than 24 years.  You are older than 24 years, and your health care provider tells you that you are at risk for this type of infection.  Your sexual activity has changed since you were last screened, and you are  at an increased risk for chlamydia or gonorrhea. Ask your health care provider if you are at risk.  If you are at risk of being infected with HIV, it is recommended that you take a prescription medicine daily to prevent HIV infection. This is called pre-exposure prophylaxis (PrEP). You are considered at risk if:  You are a man who has sex with other men (MSM).  You are a heterosexual man who is sexually active with multiple partners.  You take drugs by injection.  You are sexually active with a partner who has HIV.  Talk with your health care provider about whether you are at high risk of being infected with HIV. If you choose to begin PrEP, you should first be tested for HIV. You should then be tested every 3 months for as long as you are taking PrEP.  Use sunscreen. Apply sunscreen liberally and repeatedly throughout the day. You should seek shade when your shadow is shorter than you. Protect yourself by wearing long sleeves, pants, a wide-brimmed hat, and sunglasses year round whenever you are outdoors.  Tell your health care provider of new moles or changes in moles, especially if there is a change in shape or color. Also, tell your health care provider if a mole is larger than the size of a pencil eraser.  A one-time screening for abdominal aortic aneurysm (AAA) and surgical repair of large AAAs by ultrasound is recommended for men aged 65-75 years who are current or former smokers.  Stay current with your vaccines (immunizations).   This information is not intended to replace advice given to you by your health care provider. Make sure you discuss any questions you have with your health care provider.   Document Released: 11/07/2007 Document Revised: 06/01/2014 Document Reviewed: 10/06/2010 Elsevier Interactive Patient Education [employer]

## 2016-03-31 NOTE — Progress Notes (Signed)
Subjective:   Stephen Kelly is a 75 y.o. male who presents for Medicare Annual/Subsequent preventive examination.  Review of Systems:  No ROS.  Medicare Wellness Visit.  Cardiac Risk Factors include: advanced age (>5055men, 53>65 women);male gender;hypertension;diabetes mellitus     Objective:    Vitals: BP 120/78 (BP Location: Left Arm, Patient Position: Sitting, Cuff Size: Normal)   Pulse 82   Temp 98.4 F (36.9 C) (Oral)   Resp 14   Ht 5\' 9"  (1.753 m)   Wt 204 lb (92.5 kg)   SpO2 95%   BMI 30.13 kg/m   Body mass index is 30.13 kg/m.  Tobacco History  Smoking Status  . Never Smoker  Smokeless Tobacco  . Never Used     Counseling given: Not Answered   Past Medical History:  Diagnosis Date  . Clotting disorder (Stephen Kelly) 2011   Lupus Inhibitor positive by March 2012 studies  . Gout, arthritis    managed with allopurinol  . History of renal calculi 2001   history of lithotripsy ,  no stent  . Hyperlipidemia   . Hypertension   . Lupus anticoagulant inhibitor syndrome Stephen Kelly(Stephen Kelly)    per Stephen Kelly's test March 2012  . Patent foramen ovale   . Pulmonary nodule, right 2011   unchanged, followed by Stephen Kelly with serial ct   Past Surgical History:  Procedure Laterality Date  . THROMBECTOMY     left brachicephalic artery   Family History  Problem Relation Age of Onset  . Heart disease Mother   . Hypertension Mother   . Diabetes Mother   . Cancer Mother     ovarian  . Mental illness Father     suicide vs foul play  . Stroke Paternal Grandmother    History  Sexual Activity  . Sexual activity: Not Currently    Outpatient Encounter Prescriptions as of 03/31/2016  Medication Sig  . allopurinol (ZYLOPRIM) 100 MG tablet Take 1.5 tablets (150 mg total) by mouth daily.  [name]. atorvastatin (LIPITOR) 20 MG tablet TAKE ONE (1) TABLET EACH DAY  . fluocinonide-emollient (LIDEX-E) 0.05 % cream Apply 1 application topically 2 (two) times daily.  [name]. guaiFENesin-codeine (CHERATUSSIN AC)  100-10 MG/5ML syrup Take 5 mLs by mouth 3 (three) times daily as needed for cough.  . hydrochlorothiazide (HYDRODIURIL) 25 MG tablet TAKE ONE (1) TABLET EACH DAY  . losartan (COZAAR) 100 MG tablet TAKE ONE TABLET BY MOUTH EVERY DAY  . metFORMIN (GLUCOPHAGE) 850 MG tablet TAKE ONE (1) TABLET BY MOUTH TWO (2) TIMES DAILY WITH FOOD  . naproxen sodium (ANAPROX) 220 MG tablet Take 220 mg by mouth 2 (two) times daily with a meal.  . warfarin (COUMADIN) 3 MG tablet TAKE ONE (1) TABLET BY MOUTH EVERY DAY  . [DISCONTINUED] warfarin (COUMADIN) 2 MG tablet TAKE ONE (1) TABLET BY MOUTH EVERY DAY   No facility-administered encounter medications on file as of 03/31/2016.     Activities of Daily Living In your present state of health, do you have any difficulty performing the following activities: 03/31/2016  Hearing? N  Vision? N  Difficulty concentrating or making decisions? N  Walking or climbing stairs? N  Dressing or bathing? N  Doing errands, shopping? N  Preparing Food and eating ? N  Using the Toilet? N  In the past six months, have you accidently leaked urine? N  Do you have problems with loss of bowel control? N  Managing your Medications? N  Managing your Finances? N  Housekeeping or  managing your Housekeeping? N  Some recent data might be hidden    Patient Care Team: Stephen Shams, MD as PCP - General (Internal Medicine)   Assessment:    This is a routine wellness examination for Stephen Kelly. The goal of the wellness visit is to assist the patient how to close the gaps in care and create a preventative care plan for the patient.   Osteoporosis risk reviewed.  Medications reviewed; taking without issues or barriers.  Safety issues reviewed; lives alone.  Smoke and carbon monoxide detectors in the home. Firearms locked in a safe within the home. Wears seatbelts when driving or riding with others. No violence in the home.  No identified risk were noted; The patient was oriented x  3; appropriate in dress and manner and no objective failures at ADL's or IADL's.   Body mas index; discussed the importance of a healthy diet, water intake and exercise. Educational material provided.  Health maintenance gaps; closed.  Patient Concerns: None at this time. Follow up with PCP as needed.  Exercise Activities and Dietary recommendations Current Exercise Habits: Home exercise routine (Does yard work.  Very active around the home.), Intensity: Moderate  Goals    . Increase physical activity          Walk at the park for 3 days a week for 30 minutes      Fall Risk Fall Risk  03/31/2016 03/13/2014 12/08/2013 03/08/2013  Falls in the past year? No No No No   Depression Screen PHQ 2/9 Scores 03/31/2016 03/13/2014 12/08/2013 03/08/2013  PHQ - 2 Score 0 0 0 0    Cognitive Function MMSE - Mini Mental State Exam 03/31/2016  Orientation to time 5  Orientation to Place 5  Registration 3  Attention/ Calculation 5  Recall 3  Language- name 2 objects 2  Language- repeat 1  Language- follow 3 step command 3  Language- read & follow direction 1  Write a sentence 1  Copy design 1  Total score 30     6CIT Screen 03/31/2016  What Year? 0 points  What month? 0 points  What time? 0 points  Count back from 20 0 points  Months in reverse 0 points  Repeat phrase 0 points  Total Score 0    Immunization History  Administered Date(s) Administered  . Influenza Split 03/02/2012  . Influenza, High Dose Seasonal PF 03/12/2014, 03/11/2015, 03/11/2016  . Influenza,inj,Quad PF,36+ Mos 02/20/2013  . Pneumococcal Conjugate-13 09/06/2013  . Pneumococcal Polysaccharide-23 04/22/2011  . Tdap 04/29/2011  . Zoster 03/23/2014   Screening Tests Health Maintenance  Topic Date Due  . HEMOGLOBIN A1C  06/19/2016  . FOOT EXAM  12/18/2016  . OPHTHALMOLOGY EXAM  03/24/2017  . COLONOSCOPY  01/31/2018  . TETANUS/TDAP  04/28/2021  . INFLUENZA VACCINE  Completed  . ZOSTAVAX  Completed  .  PNA vac Low Risk Adult  Completed      Plan:    End of life planning; Advance aging; Advanced directives discussed. No HCPOA/Living Will.  Additional information provided to help him start the conversation with his family. Copy of HCPOA/Living Will shorts forms upon completion.  Time spent on this topic is 23 minutes.    Medicare Attestation I have personally reviewed: The patient's medical and social history Their use of alcohol, tobacco or illicit drugs Their current medications and supplements The patient's functional ability including ADLs,fall risks, home safety risks, cognitive, and hearing and visual impairment Diet and physical activities Evidence for depression  The patient's weight, height, BMI, and visual acuity have been recorded in the chart.  I have made referrals and provided education to the patient based on review of the above and I have provided the patient with a written personalized care plan for preventive services.    During the course of the visit the patient was educated and counseled about the following appropriate screening and preventive services:   Vaccines to include Pneumoccal, Influenza, Hepatitis B, Td, Zostavax, HCV  Electrocardiogram  Cardiovascular Disease  Colorectal cancer screening  Diabetes screening  Prostate Cancer Screening  Glaucoma screening  Nutrition counseling   Smoking cessation counseling  Patient Instructions (the written plan) was given to the patient.    Ashok PallOBrien-Blaney, Camron Essman L, LPN  40/[id number]/811911/11/2015

## 2016-04-01 ENCOUNTER — Encounter: Payer: Self-pay | Admitting: Internal Medicine

## 2016-04-05 NOTE — Progress Notes (Signed)
  I have reviewed the above information and agree with above.   Duanna Runk, MD 

## 2016-04-14 ENCOUNTER — Other Ambulatory Visit (INDEPENDENT_AMBULATORY_CARE_PROVIDER_SITE_OTHER): Payer: Medicare Other

## 2016-04-14 DIAGNOSIS — M109 Gout, unspecified: Secondary | ICD-10-CM

## 2016-04-14 DIAGNOSIS — E119 Type 2 diabetes mellitus without complications: Secondary | ICD-10-CM

## 2016-04-14 DIAGNOSIS — E782 Mixed hyperlipidemia: Secondary | ICD-10-CM

## 2016-04-14 LAB — URIC ACID: Uric Acid, Serum: 8.2 mg/dL — ABNORMAL HIGH (ref 4.0–7.8)

## 2016-04-14 LAB — LIPID PANEL
Cholesterol: 94 mg/dL (ref 0–200)
HDL: 33.7 mg/dL — ABNORMAL LOW (ref >39.00)
LDL Cholesterol: 25 mg/dL (ref 0–99)
NonHDL: 60.61
Total CHOL/HDL Ratio: 3
Triglycerides: 180 mg/dL — ABNORMAL HIGH (ref 0.0–149.0)
VLDL: 36 mg/dL (ref 0.0–40.0)

## 2016-04-14 LAB — HEMOGLOBIN A1C: Hgb A1c MFr Bld: 6.8 % — ABNORMAL HIGH (ref 4.6–6.5)

## 2016-04-15 ENCOUNTER — Other Ambulatory Visit: Payer: Self-pay | Admitting: Internal Medicine

## 2016-04-15 DIAGNOSIS — Z7901 Long term (current) use of anticoagulants: Secondary | ICD-10-CM

## 2016-04-15 MED ORDER — ALLOPURINOL 300 MG PO TABS: 300.0000 mg | ORAL_TABLET | Freq: Every day | ORAL | 0 refills | Status: DC

## 2016-04-20 ENCOUNTER — Encounter: Payer: Self-pay | Admitting: Internal Medicine

## 2016-04-20 ENCOUNTER — Ambulatory Visit (INDEPENDENT_AMBULATORY_CARE_PROVIDER_SITE_OTHER): Payer: Medicare Other | Admitting: Internal Medicine

## 2016-04-20 VITALS — BP 122/80 | HR 89 | Temp 97.8°F | Resp 12 | Ht 69.0 in | Wt 196.0 lb

## 2016-04-20 DIAGNOSIS — E785 Hyperlipidemia, unspecified: Secondary | ICD-10-CM

## 2016-04-20 DIAGNOSIS — N529 Male erectile dysfunction, unspecified: Secondary | ICD-10-CM

## 2016-04-20 DIAGNOSIS — E1169 Type 2 diabetes mellitus with other specified complication: Secondary | ICD-10-CM | POA: Diagnosis not present

## 2016-04-20 DIAGNOSIS — Z7901 Long term (current) use of anticoagulants: Secondary | ICD-10-CM | POA: Diagnosis not present

## 2016-04-20 DIAGNOSIS — E118 Type 2 diabetes mellitus with unspecified complications: Secondary | ICD-10-CM

## 2016-04-20 DIAGNOSIS — M1A0790 Idiopathic chronic gout, unspecified ankle and foot, without tophus (tophi): Secondary | ICD-10-CM | POA: Diagnosis not present

## 2016-04-20 DIAGNOSIS — E119 Type 2 diabetes mellitus without complications: Secondary | ICD-10-CM

## 2016-04-20 DIAGNOSIS — M109 Gout, unspecified: Secondary | ICD-10-CM

## 2016-04-20 DIAGNOSIS — I10 Essential (primary) hypertension: Secondary | ICD-10-CM

## 2016-04-20 MED ORDER — SILDENAFIL CITRATE 100 MG PO TABS
50.0000 mg | ORAL_TABLET | Freq: Every day | ORAL | 11 refills | Status: DC | PRN
Start: 2016-04-20 — End: 2016-10-20

## 2016-04-20 NOTE — Progress Notes (Signed)
Subjective:  Patient ID: Stephen Kelly, male    DOB: [birth year]  Age: 75 y.o. MRN: [id number]  CC: The primary encounter diagnosis was Hyperlipidemia associated with type 2 diabetes mellitus (HCC). Diagnoses of Diabetes mellitus without complication (HCC), Chronic idiopathic gout involving toe without tophus, unspecified laterality, Long term current use of anticoagulant therapy, Erectile dysfunction, unspecified erectile dysfunction type, Essential hypertension, Gout, arthritis, and DM type 2, controlled, with complication (HCC) were also pertinent to this visit.  HPI Stephen Kelly presents for 3 month follow up on diabetes, gout and hypertension.  Patient has no complaints today.  Patient is following a low glycemic index diet and taking all prescribed medications regularly without side effects.  Fasting sugars have been under less than 140 most of the time and post prandials have been under 160 except on rare occasions. Patient is exercising about 3 times per week and intentionally trying to lose weight .  Patient has had an eye exam in the last 12 months and checks feet regularly for signs of infection.  Patient does not walk barefoot outside,  And denies an numbness tingling or burning in feet. Patient is up to date on all recommended vaccinations.   Up to date on all exams and vaccines   No trouble sleeping  "I need a pill to make my penis hard."  After the death of his wife Stephen Kelly in late August,2017  He has developed a romantic relationship with Stephen Kelly's best friend who is also a widow.  They have become  Sexually intimate and he has been having trouble developing and sustaining an erection.  He is requesting medication.  He reports that he took a total of  200 mg of viagra   one day last night, but could  not sustain an erection.  He could not remember  where the vigara was obtained , but recalls that the presctription had expired over a year ago.      Lab Results  Component Value  Date   HGBA1C 6.8 (H) 04/14/2016   Lab Results  Component Value Date   MICROALBUR 4.3 (H) 03/11/2016   Has lost 8 lbs since early November,  Lots of yardwork.     Outpatient Medications Prior to Visit  Medication Sig Dispense Refill  . allopurinol (ZYLOPRIM) 300 MG tablet Take 1 tablet (300 mg total) by mouth daily. 90 tablet 0  . atorvastatin (LIPITOR) 20 MG tablet TAKE ONE (1) TABLET EACH DAY 90 tablet 3  . fluocinonide-emollient (LIDEX-E) 0.05 % cream Apply 1 application topically 2 (two) times daily. 60 g 2  . guaiFENesin-codeine (CHERATUSSIN AC) 100-10 MG/5ML syrup Take 5 mLs by mouth 3 (three) times daily as needed for cough. 180 mL 0  . hydrochlorothiazide (HYDRODIURIL) 25 MG tablet TAKE ONE (1) TABLET EACH DAY 90 tablet 0  . losartan (COZAAR) 100 MG tablet TAKE ONE TABLET BY MOUTH EVERY DAY 90 tablet 2  . metFORMIN (GLUCOPHAGE) 850 MG tablet TAKE ONE (1) TABLET BY MOUTH TWO (2) TIMES DAILY WITH FOOD 60 tablet 2  . naproxen sodium (ANAPROX) 220 MG tablet Take 220 mg by mouth 2 (two) times daily with a meal.    . warfarin (COUMADIN) 3 MG tablet TAKE ONE (1) TABLET BY MOUTH EVERY DAY 30 tablet 1   No facility-administered medications prior to visit.     Review of Systems;  Patient denies headache, fevers, malaise, unintentional weight loss, skin rash, eye pain, sinus congestion and sinus pain, sore throat, dysphagia,  hemoptysis , cough, dyspnea, wheezing, chest pain, palpitations, orthopnea, edema, abdominal pain, nausea, melena, diarrhea, constipation, flank pain, dysuria, hematuria, urinary  Frequency, nocturia, numbness, tingling, seizures,  Focal weakness, Loss of consciousness,  Tremor, insomnia, depression, anxiety, and suicidal ideation.      Objective:  BP 122/80   Pulse 89   Temp 97.8 F (36.6 C) (Oral)   Resp 12   Ht 5\' 9"  (1.753 m)   Wt 196 lb (88.9 kg)   SpO2 94%   BMI 28.94 kg/m   BP Readings from Last 3 Encounters:  04/20/16 122/80  03/31/16 120/78    03/11/16 (!) 150/86    Wt Readings from Last 3 Encounters:  04/20/16 196 lb (88.9 kg)  03/31/16 204 lb (92.5 kg)  03/11/16 201 lb 12 oz (91.5 kg)    General appearance: alert, cooperative and appears stated age Ears: normal TM's and external ear canals both ears Throat: lips, mucosa, and tongue normal; teeth and gums normal Neck: no adenopathy, no carotid bruit, supple, symmetrical, trachea midline and thyroid not enlarged, symmetric, no tenderness/mass/nodules Back: symmetric, no curvature. ROM normal. No CVA tenderness. Lungs: clear to auscultation bilaterally Heart: regular rate and rhythm, S1, S2 normal, no murmur, click, rub or gallop Abdomen: soft, non-tender; bowel sounds normal; no masses,  no organomegaly Pulses: 2+ and symmetric Skin: Skin color, texture, turgor normal. No rashes or lesions Lymph nodes: Cervical, supraclavicular, and axillary nodes normal.  Lab Results  Component Value Date   HGBA1C 6.8 (H) 04/14/2016   HGBA1C 6.4 12/18/2015   HGBA1C 6.5 03/11/2015    Lab Results  Component Value Date   CREATININE 1.41 03/11/2016   CREATININE 1.40 12/18/2015   CREATININE 1.32 03/11/2015    Lab Results  Component Value Date   WBC 8.2 03/11/2016   HGB 13.3 03/11/2016   HCT 39.5 03/11/2016   PLT 258.0 03/11/2016   GLUCOSE 135 (H) 03/11/2016   CHOL 94 04/14/2016   TRIG 180.0 (H) 04/14/2016   HDL 33.70 (L) 04/14/2016   LDLDIRECT 43.0 03/11/2016   LDLCALC 25 04/14/2016   ALT 16 03/11/2016   AST 16 03/11/2016   NA 138 03/11/2016   K 4.5 03/11/2016   CL 101 03/11/2016   CREATININE 1.41 03/11/2016   BUN 33 (H) 03/11/2016   CO2 31 03/11/2016   TSH 2.10 03/11/2016   PSA 1.43 03/11/2016   INR 2.7 (H) 04/20/2016   HGBA1C 6.8 (H) 04/14/2016   MICROALBUR 4.3 (H) 03/11/2016     Assessment & Plan:   Problem List Items Addressed This Visit    DM type 2, controlled, with complication (HCC)    Currently well-controlled on current medications .  hemoglobin  A1c is at goal of less than 7.0 . Patient is reminded to schedule an annual eye exam and foot exam is normal today. Patient has minimal  microalbuminuria. Patient is tolerating statin therapy for CAD risk reduction and on ACE/ARB for renal protection and hypertension .  Lab Results  Component Value Date   HGBA1C 6.8 (H) 04/14/2016   Lab Results  Component Value Date   MICROALBUR 4.3 (H) 03/11/2016    Lab Results  Component Value Date   CHOL 94 04/14/2016   HDL 33.70 (L) 04/14/2016   LDLCALC 25 04/14/2016   LDLDIRECT 43.0 03/11/2016   TRIG 180.0 (H) 04/14/2016   CHOLHDL 3 04/14/2016            Erectile dysfunction    Cautioned not to use > 100 mg of  viagra in a 24 hour period.  Risk of priapism discussed. Medication refilled.       Gout, arthritis    Allopurinol dose increased for uric acid level of > 8  Lab Results  Component Value Date   LABURIC 8.2 (H) 04/14/2016   Lab Results  Component Value Date   CREATININE 1.41 03/11/2016         Hyperlipidemia associated with type 2 diabetes mellitus (HCC) - Primary    LDL is at goal with atorvastatin . LFTs are normal and he is tolerating therapy.   no changes today,   Lab Results  Component Value Date   CHOL 94 04/14/2016   HDL 33.70 (L) 04/14/2016   LDLCALC 25 04/14/2016   LDLDIRECT 43.0 03/11/2016   TRIG 180.0 (H) 04/14/2016   CHOLHDL 3 04/14/2016   Lab Results  Component Value Date   ALT 16 03/11/2016   AST 16 03/11/2016   ALKPHOS 41 03/11/2016   BILITOT 0.7 03/11/2016             Relevant Medications   sildenafil (VIAGRA) 100 MG tablet   Other Relevant Orders   Comprehensive metabolic panel   LDL cholesterol, direct   Hypertension    Well controlled on current regimen. Renal function stable, no changes today.  Lab Results  Component Value Date   CREATININE 1.41 03/11/2016   Lab Results  Component Value Date   NA 138 03/11/2016   K 4.5 03/11/2016   CL 101 03/11/2016   CO2 31  03/11/2016         Relevant Medications   sildenafil (VIAGRA) 100 MG tablet   Long term current use of anticoagulant therapy    At goal on current regimen.  No changes  Lab Results  Component Value Date   INR 2.7 (H) 04/20/2016   INR 2.8 (H) 03/11/2016   INR 2.4 (H) 03/04/2016          Other Visit Diagnoses    Diabetes mellitus without complication (HCC)       Relevant Orders   Hemoglobin A1c   Microalbumin / creatinine urine ratio   Lipid panel   Chronic idiopathic gout involving toe without tophus, unspecified laterality       Relevant Orders   Uric acid     A total of 25 minutes of face to face time was spent with patient more than half of which was spent in counselling about the above mentioned conditions  and coordination of care  I am having Mr. Suzie Portelaayne start on sildenafil. I am also having him maintain his naproxen sodium, atorvastatin, losartan, warfarin, hydrochlorothiazide, metFORMIN, fluocinonide-emollient, guaiFENesin-codeine, and allopurinol.  Meds ordered this encounter  Medications  . sildenafil (VIAGRA) 100 MG tablet    Sig: Take 0.5-1 tablets (50-100 mg total) by mouth daily as needed for erectile dysfunction.    Dispense:  5 tablet    Refill:  11    There are no discontinued medications.  Follow-up: Return in about 6 months (around 10/18/2016) for follow up diabetes.   Sherlene ShamsULLO, TERESA L, MD

## 2016-04-20 NOTE — Progress Notes (Signed)
Pre-visit discussion using our clinic review tool. No additional management support is needed unless otherwise documented below in the visit note.  

## 2016-04-20 NOTE — Addendum Note (Signed)
Addended by: Felix AhmadiFRANSEN, Bonne Whack A on: 04/20/2016 04:08 PM   Modules accepted: Orders

## 2016-04-20 NOTE — Patient Instructions (Addendum)
I'm glad you are finding romance  And companionship!  The maximum dose of Viagra is 100 mg per night.   I'll see you in 6 months for follow up on your diabetes   Sildenafil tablets (Viagra) What is this medicine? SILDENAFIL (sil DEN a fil) is used to treat erection problems in men. This medicine may be used for other purposes; ask your health care provider or pharmacist if you have questions. COMMON BRAND NAME(S): Viagra What should I tell my health care provider before I take this medicine? They need to know if you have any of these conditions: -bleeding disorders -eye or vision problems, including a rare inherited eye disease called retinitis pigmentosa -anatomical deformation of the penis, Peyronie's disease, or history of priapism (painful and prolonged erection) -heart disease, angina, a history of heart attack, irregular heart beats, or other heart problems -high or low blood pressure -history of blood diseases, like sickle cell anemia or leukemia -history of stomach bleeding -kidney disease -liver disease -stroke -an unusual or allergic reaction to sildenafil, other medicines, foods, dyes, or preservatives -pregnant or trying to get pregnant -breast-feeding How should I use this medicine? Take this medicine by mouth with a glass of water. Follow the directions on the prescription label. The dose is usually taken 1 hour before sexual activity. You should not take the dose more than once per day. Do not take your medicine more often than directed. Talk to your pediatrician regarding the use of this medicine in children. This medicine is not used in children for this condition. Overdosage: If you think you have taken too much of this medicine contact a poison control center or emergency room at once. NOTE: This medicine is only for you. Do not share this medicine with others. What if I miss a dose? This does not apply. Do not take double or extra doses. What may interact with  this medicine? Do not take this medicine with any of the following medications: -cisapride -nitrates like amyl nitrite, isosorbide dinitrate, isosorbide mononitrate, nitroglycerin -riociguat This medicine may also interact with the following medications: -antiviral medicines for HIV or AIDS -bosentan -certain medicines for benign prostatic hyperplasia (BPH) -certain medicines for blood pressure -certain medicines for fungal infections like ketoconazole and itraconazole -cimetidine -erythromycin -rifampin This list may not describe all possible interactions. Give your health care provider a list of all the medicines, herbs, non-prescription drugs, or dietary supplements you use. Also tell them if you smoke, drink alcohol, or use illegal drugs. Some items may interact with your medicine. What should I watch for while using this medicine? If you notice any changes in your vision while taking this drug, call your doctor or health care professional as soon as possible. Stop using this medicine and call your health care provider right away if you have a loss of sight in one or both eyes. Contact your doctor or health care professional right away if you have an erection that lasts longer than 4 hours or if it becomes painful. This may be a sign of a serious problem and must be treated right away to prevent permanent damage. If you experience symptoms of nausea, dizziness, chest pain or arm pain upon initiation of sexual activity after taking this medicine, you should refrain from further activity and call your doctor or health care professional as soon as possible. Do not drink alcohol to excess (examples, 5 glasses of wine or 5 shots of whiskey) when taking this medicine. When taken in excess, alcohol can  increase your chances of getting a headache or getting dizzy, increasing your heart rate or lowering your blood pressure. Using this medicine does not protect you or your partner against HIV infection  (the virus that causes AIDS) or other sexually transmitted diseases. What side effects may I notice from receiving this medicine? Side effects that you should report to your doctor or health care professional as soon as possible: -allergic reactions like skin rash, itching or hives, swelling of the face, lips, or tongue -breathing problems -changes in hearing -changes in vision -chest pain -fast, irregular heartbeat -prolonged or painful erection -seizures Side effects that usually do not require medical attention (report to your doctor or health care professional if they continue or are bothersome): -back pain -dizziness -flushing -headache -indigestion -muscle aches -nausea -stuffy or runny nose This list may not describe all possible side effects. Call your doctor for medical advice about side effects. You may report side effects to FDA at 1-800-FDA-1088. Where should I keep my medicine? Keep out of reach of children. Store at room temperature between 15 and 30 degrees C (59 and 86 degrees F). Throw away any unused medicine after the expiration date. NOTE: This sheet is a summary. It may not cover all possible information. If you have questions about this medicine, talk to your doctor, pharmacist, or health care provider.  2017 Elsevier/Gold Standard (2015-04-24 12:00:25)

## 2016-04-21 DIAGNOSIS — N529 Male erectile dysfunction, unspecified: Secondary | ICD-10-CM | POA: Insufficient documentation

## 2016-04-21 LAB — COMPREHENSIVE METABOLIC PANEL
ALT: 13 U/L (ref 0–53)
AST: 13 U/L (ref 0–37)
Albumin: 4.2 g/dL (ref 3.5–5.2)
Alkaline Phosphatase: 40 U/L (ref 39–117)
BUN: 37 mg/dL — ABNORMAL HIGH (ref 6–23)
CO2: 30 mEq/L (ref 19–32)
Calcium: 10.3 mg/dL (ref 8.4–10.5)
Chloride: 103 mEq/L (ref 96–112)
Creatinine, Ser: 1.79 mg/dL — ABNORMAL HIGH (ref 0.40–1.50)
GFR: 39.57 mL/min — ABNORMAL LOW (ref >60.00)
Glucose, Bld: 117 mg/dL — ABNORMAL HIGH (ref 70–99)
Potassium: 4.2 mEq/L (ref 3.5–5.1)
Sodium: 142 mEq/L (ref 135–145)
Total Bilirubin: 0.4 mg/dL (ref 0.2–1.2)
Total Protein: 6.9 g/dL (ref 6.0–8.3)

## 2016-04-21 LAB — PROTIME-INR
INR: 2.7 — ABNORMAL HIGH
Prothrombin Time: 27.4 s — ABNORMAL HIGH (ref 9.0–11.5)

## 2016-04-21 LAB — LDL CHOLESTEROL, DIRECT: Direct LDL: 36 mg/dL

## 2016-04-21 LAB — LIPID PANEL
Cholesterol: 96 mg/dL (ref 0–200)
HDL: 30.5 mg/dL — ABNORMAL LOW (ref >39.00)
NonHDL: 65.71
Total CHOL/HDL Ratio: 3
Triglycerides: 296 mg/dL — ABNORMAL HIGH (ref 0.0–149.0)
VLDL: 59.2 mg/dL — ABNORMAL HIGH (ref 0.0–40.0)

## 2016-04-21 LAB — MICROALBUMIN / CREATININE URINE RATIO
Creatinine,U: 215.4 mg/dL
Microalb Creat Ratio: 2.6 mg/g (ref 0.0–30.0)
Microalb, Ur: 5.7 mg/dL — ABNORMAL HIGH (ref 0.0–1.9)

## 2016-04-21 LAB — URIC ACID: Uric Acid, Serum: 7.1 mg/dL (ref 4.0–7.8)

## 2016-04-21 LAB — HEMOGLOBIN A1C: Hgb A1c MFr Bld: 6.7 % — ABNORMAL HIGH (ref 4.6–6.5)

## 2016-04-21 NOTE — Assessment & Plan Note (Signed)
Cautioned not to use > 100 mg of viagra in a 24 hour period.  Risk of priapism discussed. Medication refilled.

## 2016-04-21 NOTE — Assessment & Plan Note (Signed)
Allopurinol dose increased for uric acid level of > 8  Lab Results  Component Value Date   LABURIC 8.2 (H) 04/14/2016   Lab Results  Component Value Date   CREATININE 1.41 03/11/2016

## 2016-04-21 NOTE — Assessment & Plan Note (Signed)
At goal on current regimen.  No changes  Lab Results  Component Value Date   INR 2.7 (H) 04/20/2016   INR 2.8 (H) 03/11/2016   INR 2.4 (H) 03/04/2016

## 2016-04-21 NOTE — Assessment & Plan Note (Signed)
LDL is at goal with atorvastatin . LFTs are normal and he is tolerating therapy.   no changes today,   Lab Results  Component Value Date   CHOL 94 04/14/2016   HDL 33.70 (L) 04/14/2016   LDLCALC 25 04/14/2016   LDLDIRECT 43.0 03/11/2016   TRIG 180.0 (H) 04/14/2016   CHOLHDL 3 04/14/2016   Lab Results  Component Value Date   ALT 16 03/11/2016   AST 16 03/11/2016   ALKPHOS 41 03/11/2016   BILITOT 0.7 03/11/2016

## 2016-04-21 NOTE — Assessment & Plan Note (Signed)
Currently well-controlled on current medications .  hemoglobin A1c is at goal of less than 7.0 . Patient is reminded to schedule an annual eye exam and foot exam is normal today. Patient has minimal  microalbuminuria. Patient is tolerating statin therapy for CAD risk reduction and on ACE/ARB for renal protection and hypertension .  Lab Results  Component Value Date   HGBA1C 6.8 (H) 04/14/2016   Lab Results  Component Value Date   MICROALBUR 4.3 (H) 03/11/2016    Lab Results  Component Value Date   CHOL 94 04/14/2016   HDL 33.70 (L) 04/14/2016   LDLCALC 25 04/14/2016   LDLDIRECT 43.0 03/11/2016   TRIG 180.0 (H) 04/14/2016   CHOLHDL 3 04/14/2016

## 2016-04-21 NOTE — Assessment & Plan Note (Signed)
Well controlled on current regimen. Renal function stable, no changes today.  Lab Results  Component Value Date   CREATININE 1.41 03/11/2016   Lab Results  Component Value Date   NA 138 03/11/2016   K 4.5 03/11/2016   CL 101 03/11/2016   CO2 31 03/11/2016

## 2016-04-23 ENCOUNTER — Telehealth: Payer: Self-pay | Admitting: Internal Medicine

## 2016-04-23 NOTE — Telephone Encounter (Signed)
Patient advised see lab for documention

## 2016-04-23 NOTE — Telephone Encounter (Signed)
Pt called returning your call in regards to labs. Thank you!  Call pt @ [phone]

## 2016-04-23 NOTE — Telephone Encounter (Signed)
Left message to call.

## 2016-04-25 NOTE — Addendum Note (Signed)
Addended by: Sherlene ShamsULLO, TERESA L on: 04/25/2016 09:01 PM   Modules accepted: Orders

## 2016-05-12 ENCOUNTER — Other Ambulatory Visit: Payer: Self-pay | Admitting: Internal Medicine

## 2016-05-21 ENCOUNTER — Other Ambulatory Visit (INDEPENDENT_AMBULATORY_CARE_PROVIDER_SITE_OTHER): Payer: Medicare Other

## 2016-05-21 ENCOUNTER — Other Ambulatory Visit: Payer: Medicare Other

## 2016-05-21 DIAGNOSIS — Z7901 Long term (current) use of anticoagulants: Secondary | ICD-10-CM

## 2016-05-21 DIAGNOSIS — Z5181 Encounter for therapeutic drug level monitoring: Secondary | ICD-10-CM

## 2016-05-21 DIAGNOSIS — E1169 Type 2 diabetes mellitus with other specified complication: Secondary | ICD-10-CM

## 2016-05-21 DIAGNOSIS — E119 Type 2 diabetes mellitus without complications: Secondary | ICD-10-CM

## 2016-05-21 DIAGNOSIS — E785 Hyperlipidemia, unspecified: Secondary | ICD-10-CM

## 2016-05-21 LAB — PROTIME-INR
INR: 2.8 ratio — ABNORMAL HIGH (ref 0.8–1.0)
Prothrombin Time: 30.1 s — ABNORMAL HIGH (ref 9.6–13.1)

## 2016-05-26 ENCOUNTER — Other Ambulatory Visit: Payer: Medicare Other

## 2016-06-11 ENCOUNTER — Ambulatory Visit: Payer: Medicare Other | Admitting: Internal Medicine

## 2016-06-15 ENCOUNTER — Other Ambulatory Visit: Payer: Self-pay | Admitting: Internal Medicine

## 2016-06-24 ENCOUNTER — Other Ambulatory Visit: Payer: Self-pay | Admitting: Internal Medicine

## 2016-07-01 ENCOUNTER — Other Ambulatory Visit: Payer: Self-pay | Admitting: Internal Medicine

## 2016-07-10 ENCOUNTER — Telehealth: Payer: Self-pay | Admitting: Internal Medicine

## 2016-07-10 MED ORDER — SILDENAFIL CITRATE 20 MG PO TABS: 20.0000 mg | ORAL_TABLET | Freq: Three times a day (TID) | ORAL | 0 refills | Status: DC

## 2016-07-10 NOTE — Telephone Encounter (Signed)
We can try,  But that is the odse for pulmonary hypertension,  NOT ED

## 2016-07-10 NOTE — Telephone Encounter (Signed)
Pt would like to know if Dr. Darrick Huntsmanullo would prescribe Sildenafil Citrate 20MG  T to replace sildenafil (VIAGRA) 100 MG tablet. Cost is cheaper. Please call pt at [phone].

## 2016-07-10 NOTE — Telephone Encounter (Signed)
LOV: 04/20/16 Next: 10/20/16  Please advise

## 2016-07-13 NOTE — Telephone Encounter (Signed)
Pt informed

## 2016-07-14 ENCOUNTER — Other Ambulatory Visit: Payer: Self-pay | Admitting: Internal Medicine

## 2016-07-15 ENCOUNTER — Other Ambulatory Visit: Payer: Self-pay | Admitting: Internal Medicine

## 2016-08-11 ENCOUNTER — Other Ambulatory Visit: Payer: Self-pay | Admitting: Internal Medicine

## 2016-08-14 ENCOUNTER — Other Ambulatory Visit (INDEPENDENT_AMBULATORY_CARE_PROVIDER_SITE_OTHER): Payer: Medicare Other

## 2016-08-14 DIAGNOSIS — E1169 Type 2 diabetes mellitus with other specified complication: Secondary | ICD-10-CM | POA: Diagnosis not present

## 2016-08-14 DIAGNOSIS — E785 Hyperlipidemia, unspecified: Secondary | ICD-10-CM | POA: Diagnosis not present

## 2016-08-14 DIAGNOSIS — E119 Type 2 diabetes mellitus without complications: Secondary | ICD-10-CM

## 2016-08-14 LAB — COMPREHENSIVE METABOLIC PANEL
ALT: 14 U/L (ref 0–53)
AST: 12 U/L (ref 0–37)
Albumin: 4.1 g/dL (ref 3.5–5.2)
Alkaline Phosphatase: 45 U/L (ref 39–117)
BUN: 24 mg/dL — ABNORMAL HIGH (ref 6–23)
CO2: 28 mEq/L (ref 19–32)
Calcium: 9.4 mg/dL (ref 8.4–10.5)
Chloride: 102 mEq/L (ref 96–112)
Creatinine, Ser: 1.68 mg/dL — ABNORMAL HIGH (ref 0.40–1.50)
GFR: 42.54 mL/min — ABNORMAL LOW (ref >60.00)
Glucose, Bld: 207 mg/dL — ABNORMAL HIGH (ref 70–99)
Potassium: 4 mEq/L (ref 3.5–5.1)
Sodium: 139 mEq/L (ref 135–145)
Total Bilirubin: 0.5 mg/dL (ref 0.2–1.2)
Total Protein: 6.9 g/dL (ref 6.0–8.3)

## 2016-08-14 LAB — LIPID PANEL
Cholesterol: 99 mg/dL (ref 0–200)
HDL: 31.6 mg/dL — ABNORMAL LOW (ref >39.00)
NonHDL: 66.92
Total CHOL/HDL Ratio: 3
Triglycerides: 231 mg/dL — ABNORMAL HIGH (ref 0.0–149.0)
VLDL: 46.2 mg/dL — ABNORMAL HIGH (ref 0.0–40.0)

## 2016-08-14 LAB — HEMOGLOBIN A1C: Hgb A1c MFr Bld: 7.1 % — ABNORMAL HIGH (ref 4.6–6.5)

## 2016-08-14 LAB — LDL CHOLESTEROL, DIRECT: Direct LDL: 42 mg/dL

## 2016-08-17 ENCOUNTER — Other Ambulatory Visit: Payer: Self-pay | Admitting: Internal Medicine

## 2016-08-17 DIAGNOSIS — D689 Coagulation defect, unspecified: Secondary | ICD-10-CM

## 2016-08-17 DIAGNOSIS — R944 Abnormal results of kidney function studies: Secondary | ICD-10-CM

## 2016-08-17 NOTE — Progress Notes (Unsigned)
basic 

## 2016-08-25 ENCOUNTER — Other Ambulatory Visit (INDEPENDENT_AMBULATORY_CARE_PROVIDER_SITE_OTHER): Payer: Medicare Other

## 2016-08-25 DIAGNOSIS — D689 Coagulation defect, unspecified: Secondary | ICD-10-CM | POA: Diagnosis not present

## 2016-08-25 DIAGNOSIS — R944 Abnormal results of kidney function studies: Secondary | ICD-10-CM | POA: Diagnosis not present

## 2016-08-25 LAB — BASIC METABOLIC PANEL
BUN: 22 mg/dL (ref 6–23)
CO2: 30 mEq/L (ref 19–32)
Calcium: 9.7 mg/dL (ref 8.4–10.5)
Chloride: 103 mEq/L (ref 96–112)
Creatinine, Ser: 1.39 mg/dL (ref 0.40–1.50)
GFR: 52.94 mL/min — ABNORMAL LOW (ref >60.00)
Glucose, Bld: 162 mg/dL — ABNORMAL HIGH (ref 70–99)
Potassium: 4.1 mEq/L (ref 3.5–5.1)
Sodium: 139 mEq/L (ref 135–145)

## 2016-08-25 LAB — PROTIME-INR
INR: 2.3 ratio — ABNORMAL HIGH (ref 0.8–1.0)
Prothrombin Time: 24.7 s — ABNORMAL HIGH (ref 9.6–13.1)

## 2016-08-31 ENCOUNTER — Other Ambulatory Visit: Payer: Self-pay | Admitting: Internal Medicine

## 2016-09-11 ENCOUNTER — Other Ambulatory Visit: Payer: Self-pay | Admitting: Internal Medicine

## 2016-10-01 ENCOUNTER — Other Ambulatory Visit: Payer: Self-pay | Admitting: Internal Medicine

## 2016-10-12 ENCOUNTER — Telehealth: Payer: Self-pay | Admitting: Radiology

## 2016-10-12 ENCOUNTER — Other Ambulatory Visit: Payer: Self-pay | Admitting: Internal Medicine

## 2016-10-12 DIAGNOSIS — E1169 Type 2 diabetes mellitus with other specified complication: Secondary | ICD-10-CM

## 2016-10-12 DIAGNOSIS — E785 Hyperlipidemia, unspecified: Secondary | ICD-10-CM

## 2016-10-12 DIAGNOSIS — E118 Type 2 diabetes mellitus with unspecified complications: Secondary | ICD-10-CM

## 2016-10-12 DIAGNOSIS — I10 Essential (primary) hypertension: Secondary | ICD-10-CM

## 2016-10-12 DIAGNOSIS — Z7901 Long term (current) use of anticoagulants: Secondary | ICD-10-CM

## 2016-10-12 NOTE — Telephone Encounter (Signed)
He is too early,  May 24th is the earliest he should be coming.  Why can't the schedulers get this right?  90 + days!!

## 2016-10-12 NOTE — Addendum Note (Signed)
Addended by: Sherlene ShamsULLO, Lindell Renfrew L on: 10/12/2016 01:22 PM   Modules accepted: Orders

## 2016-10-12 NOTE — Telephone Encounter (Signed)
PT coming in for labs tomorrow, please place future orders. Thank you.  

## 2016-10-12 NOTE — Telephone Encounter (Signed)
Per Dr. Darrick Huntsmanullo pt is to early for lab work and should be done after May 24th. Please advice.

## 2016-10-13 ENCOUNTER — Other Ambulatory Visit: Payer: Medicare Other

## 2016-10-13 NOTE — Telephone Encounter (Signed)
Patient has been scheduled

## 2016-10-16 ENCOUNTER — Other Ambulatory Visit (INDEPENDENT_AMBULATORY_CARE_PROVIDER_SITE_OTHER): Payer: Medicare Other

## 2016-10-16 ENCOUNTER — Other Ambulatory Visit: Payer: Self-pay | Admitting: Internal Medicine

## 2016-10-16 DIAGNOSIS — E118 Type 2 diabetes mellitus with unspecified complications: Secondary | ICD-10-CM

## 2016-10-16 DIAGNOSIS — E1169 Type 2 diabetes mellitus with other specified complication: Secondary | ICD-10-CM

## 2016-10-16 DIAGNOSIS — E785 Hyperlipidemia, unspecified: Secondary | ICD-10-CM

## 2016-10-16 DIAGNOSIS — Z7901 Long term (current) use of anticoagulants: Secondary | ICD-10-CM

## 2016-10-16 LAB — COMPREHENSIVE METABOLIC PANEL
ALT: 14 U/L (ref 0–53)
AST: 15 U/L (ref 0–37)
Albumin: 4.1 g/dL (ref 3.5–5.2)
Alkaline Phosphatase: 39 U/L (ref 39–117)
BUN: 30 mg/dL — ABNORMAL HIGH (ref 6–23)
CO2: 27 mEq/L (ref 19–32)
Calcium: 9.6 mg/dL (ref 8.4–10.5)
Chloride: 106 mEq/L (ref 96–112)
Creatinine, Ser: 1.45 mg/dL (ref 0.40–1.50)
GFR: 50.4 mL/min — ABNORMAL LOW (ref >60.00)
Glucose, Bld: 134 mg/dL — ABNORMAL HIGH (ref 70–99)
Potassium: 4.2 mEq/L (ref 3.5–5.1)
Sodium: 141 mEq/L (ref 135–145)
Total Bilirubin: 0.5 mg/dL (ref 0.2–1.2)
Total Protein: 6.9 g/dL (ref 6.0–8.3)

## 2016-10-16 LAB — CBC WITH DIFFERENTIAL/PLATELET
Basophils Absolute: 0.1 10*3/uL (ref 0.0–0.1)
Basophils Relative: 0.7 % (ref 0.0–3.0)
Eosinophils Absolute: 0.5 10*3/uL (ref 0.0–0.7)
Eosinophils Relative: 5.6 % — ABNORMAL HIGH (ref 0.0–5.0)
HCT: 37.1 % — ABNORMAL LOW (ref 39.0–52.0)
Hemoglobin: 12.5 g/dL — ABNORMAL LOW (ref 13.0–17.0)
Lymphocytes Relative: 27.2 % (ref 12.0–46.0)
Lymphs Abs: 2.2 10*3/uL (ref 0.7–4.0)
MCHC: 33.7 g/dL (ref 30.0–36.0)
MCV: 88.4 fl (ref 78.0–100.0)
Monocytes Absolute: 0.6 10*3/uL (ref 0.1–1.0)
Monocytes Relative: 7.2 % (ref 3.0–12.0)
Neutro Abs: 4.8 10*3/uL (ref 1.4–7.7)
Neutrophils Relative %: 59.3 % (ref 43.0–77.0)
Platelets: 264 10*3/uL (ref 150.0–400.0)
RBC: 4.19 Mil/uL — ABNORMAL LOW (ref 4.22–5.81)
RDW: 15.2 % (ref 11.5–15.5)
WBC: 8.1 10*3/uL (ref 4.0–10.5)

## 2016-10-16 LAB — MICROALBUMIN / CREATININE URINE RATIO
Creatinine,U: 133.8 mg/dL
Microalb Creat Ratio: 2.7 mg/g (ref 0.0–30.0)
Microalb, Ur: 3.6 mg/dL — ABNORMAL HIGH (ref 0.0–1.9)

## 2016-10-16 LAB — LIPID PANEL
Cholesterol: 92 mg/dL (ref 0–200)
HDL: 28.6 mg/dL — ABNORMAL LOW (ref >39.00)
NonHDL: 63.83
Total CHOL/HDL Ratio: 3
Triglycerides: 206 mg/dL — ABNORMAL HIGH (ref 0.0–149.0)
VLDL: 41.2 mg/dL — ABNORMAL HIGH (ref 0.0–40.0)

## 2016-10-16 LAB — PROTIME-INR
INR: 2.5 ratio — ABNORMAL HIGH (ref 0.8–1.0)
Prothrombin Time: 26.3 s — ABNORMAL HIGH (ref 9.6–13.1)

## 2016-10-16 LAB — LDL CHOLESTEROL, DIRECT: Direct LDL: 36 mg/dL

## 2016-10-16 LAB — HEMOGLOBIN A1C: Hgb A1c MFr Bld: 7.3 % — ABNORMAL HIGH (ref 4.6–6.5)

## 2016-10-16 MED ORDER — WARFARIN SODIUM 2.5 MG PO TABS: 2.5000 mg | ORAL_TABLET | Freq: Every day | ORAL | 2 refills | Status: DC

## 2016-10-16 NOTE — Addendum Note (Signed)
Addended by: Sherlene ShamsULLO, Faithanne Verret L on: 10/16/2016 04:40 PM   Modules accepted: Orders

## 2016-10-16 NOTE — Progress Notes (Signed)
Lab Results  Component Value Date   INR 2.5 (H) 10/16/2016   INR 2.3 (H) 08/25/2016   INR 2.8 (H) 05/21/2016

## 2016-10-20 ENCOUNTER — Encounter: Payer: Self-pay | Admitting: Internal Medicine

## 2016-10-20 ENCOUNTER — Telehealth: Payer: Self-pay | Admitting: Internal Medicine

## 2016-10-20 ENCOUNTER — Ambulatory Visit (INDEPENDENT_AMBULATORY_CARE_PROVIDER_SITE_OTHER): Payer: Medicare Other | Admitting: Internal Medicine

## 2016-10-20 DIAGNOSIS — D6862 Lupus anticoagulant syndrome: Secondary | ICD-10-CM

## 2016-10-20 DIAGNOSIS — E782 Mixed hyperlipidemia: Secondary | ICD-10-CM | POA: Diagnosis not present

## 2016-10-20 DIAGNOSIS — Z7901 Long term (current) use of anticoagulants: Secondary | ICD-10-CM | POA: Diagnosis not present

## 2016-10-20 DIAGNOSIS — E118 Type 2 diabetes mellitus with unspecified complications: Secondary | ICD-10-CM

## 2016-10-20 DIAGNOSIS — I10 Essential (primary) hypertension: Secondary | ICD-10-CM

## 2016-10-20 DIAGNOSIS — N529 Male erectile dysfunction, unspecified: Secondary | ICD-10-CM

## 2016-10-20 MED ORDER — ALLOPURINOL 300 MG PO TABS: 300.0000 mg | ORAL_TABLET | Freq: Every day | ORAL | 2 refills | Status: DC

## 2016-10-20 MED ORDER — SILDENAFIL CITRATE 100 MG PO TABS: 50.0000 mg | ORAL_TABLET | Freq: Every day | ORAL | 11 refills | Status: DC | PRN

## 2016-10-20 NOTE — Assessment & Plan Note (Signed)
LDL is  < 70  with atorvastatin . LFTs are normal and he is tolerating therapy.   no changes today,   Lab Results  Component Value Date   CHOL 92 10/16/2016   HDL 28.60 (L) 10/16/2016   LDLCALC 25 04/14/2016   LDLDIRECT 36.0 10/16/2016   TRIG 206.0 (H) 10/16/2016   CHOLHDL 3 10/16/2016   Lab Results  Component Value Date   ALT 14 10/16/2016   AST 15 10/16/2016   ALKPHOS 39 10/16/2016   BILITOT 0.5 10/16/2016

## 2016-10-20 NOTE — Telephone Encounter (Signed)
PT called and stated that he went to go pick up his 5 sildenafil (VIAGRA) 100 MG tablet pills and they were $621.00. Pt asked that something else be called in. Please advise, thank you!  Call pt @ [phone]

## 2016-10-20 NOTE — Assessment & Plan Note (Signed)
Cautioned not to use > 100 mg of viagra in a 24 hour period.  Risk of priapism discussed. Medication refilled.  

## 2016-10-20 NOTE — Assessment & Plan Note (Signed)
,  diabetes is still under good control on current regimen, but  a1c has risen slightly compared to last time. other labs are normal. No medication changes were advised at this time, but he was advised to to limit the number of complex carbohydrates (starches, including white potatoes, Cereals, Cookies and cake) in your diet to 2 daily and advise to participate in exercise regularly .  return in 3 months   Lab Results  Component Value Date   HGBA1C 7.3* 05/14/2015   Lab Results  Component Value Date   MICROALBUR 2.2* 11/13/2014

## 2016-10-20 NOTE — Patient Instructions (Signed)
   I have increased your Viagra dose from 20 mg to 100 mg.  Your insurance will allow you 5/month  You diabetes is still under good control on current regimen, but  You a1c has risen slightly compared to last time. An dis now 7.3 (foal is < 7.0) Your  other labs are unchanged .   No medication changes are advised at this time, but he you need to  limit the number of complex carbohydrates (starches, including white potatoes, Cereals, Cookies and BISCUITS) in your diet to 1 daily and  participate in some form of  exercise with a goal of doing it 5 days per week .  Please return in 3 months   We will repeat your INR next week

## 2016-10-20 NOTE — Assessment & Plan Note (Signed)
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  He is not a candidate for Eliquis or Pradaxa due to his history.

## 2016-10-20 NOTE — Assessment & Plan Note (Signed)
Well controlled on current regimen. Renal function stable, no changes today.  Lab Results  Component Value Date   CREATININE 1.45 10/16/2016   Lab Results  Component Value Date   NA 141 10/16/2016   K 4.2 10/16/2016   CL 106 10/16/2016   CO2 27 10/16/2016

## 2016-10-20 NOTE — Assessment & Plan Note (Signed)
Current dose is 2.5 mg daily , reduced from 3 mg daily . Repeat INR next week

## 2016-10-20 NOTE — Progress Notes (Signed)
Subjective:  Patient ID: Stephen Kelly, male    DOB: 1940-08-02  Age: 76 y.o. MRN: [id number]  CC: Diagnoses of Anticoagulation monitoring, INR range 2-3, Mixed hyperlipidemia, 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HCC), Essential hypertension, DM type 2, controlled, with complication (HCC), and Erectile dysfunction, unspecified erectile dysfunction type were pertinent to this visit.  HPI Stephen Kelly presents for follow up on multiple issue, including Type 2 DM, hyperlipidemia, and gout.  Patient does not check blood sugars more than once a month,  Last one was 135 a month ago in a fasting state.  Does not recall any above 200 lr less than 80.  No complaints today.except that "I need a different  medication to get an erection."  Not exercising on a regular basis or trying to lose weight.  Patient voices awareness  of the foods he/she needs to avoid,  And follows a low GI diet about 50% of the time.  Has not had an annual diabetic eye exam.  Denies numbness and tingling in lower extremities.  Denies hypoglycemic symptoms.   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  Not active the other days. Weight gain addressed.  celebrated his birthday for almost a week.    "I like to eat"  Last eye exam Oct 2017.  No retinopathy,  Early cataracts seen.   2 concerns:  1) His barber had noticed Several crusting skin scalp lesions on occipital area, present for the past  2 months.  Discussed dermatology referral to Dr. Adolphus Birchwood  referral   2) ED:  Jamal Maes viagra 20 mg dose, repeated dose an hour later,  Still unable to achieve an erection ,  The cost was $70 per pill     Lab Results  Component Value Date   PSA 1.43 03/11/2016   PSA 1.15 03/11/2015   PSA 1.58 03/12/2014   ob    Outpatient Medications Prior to Visit  Medication Sig Dispense Refill  . atorvastatin (LIPITOR) 20 MG tablet TAKE ONE (1) TABLET BY MOUTH EVERY DAY 90 tablet 1  . fluocinonide-emollient (LIDEX-E) 0.05 % cream Apply 1 application topically 2 (two) times daily. 60 g 2  . guaiFENesin-codeine (CHERATUSSIN AC) 100-10 MG/5ML syrup Take 5 mLs by mouth 3 (three) times daily as needed for cough. 180 mL 0  . hydrochlorothiazide (HYDRODIURIL) 25 MG tablet TAKE ONE (1) TABLET BY MOUTH EVERY DAY 90 tablet 3  . losartan (COZAAR) 100 MG tablet TAKE ONE (1) TABLET BY MOUTH EVERY DAY 90 tablet 0  . metFORMIN (GLUCOPHAGE) 850 MG tablet TAKE ONE (1) TABLET BY MOUTH TWO (2) TIMES DAILY WITH FOOD 60 tablet 4  . naproxen sodium (ANAPROX) 220 MG tablet Take 220 mg by mouth 2 (two) times daily with a meal.    . warfarin (COUMADIN) 2.5 MG tablet Take 1 tablet (2.5 mg total) by mouth daily. 30 tablet 2  . allopurinol (ZYLOPRIM) 300 MG tablet TAKE 1 TABLET (300 MG TOTAL) BY MOUTH DAILY. 90 tablet 0  . sildenafil (REVATIO) 20 MG tablet TAKE ONE (1) TABLET BY MOUTH 3 TIMES DAILY (Patient not taking: Reported on 10/20/2016) 10 tablet 0  . sildenafil (VIAGRA) 100  MG tablet Take 0.5-1 tablets (50-100 mg total) by mouth daily as needed for erectile dysfunction. (Patient not taking: Reported on 10/20/2016) 5 tablet 11   No facility-administered medications prior to visit.     Review of Systems;  Patient denies headache, fevers, malaise, unintentional weight loss, skin rash, eye pain, sinus congestion and sinus pain, sore throat, dysphagia,  hemoptysis , cough, dyspnea, wheezing, chest pain, palpitations, orthopnea, edema, abdominal pain, nausea, melena, diarrhea, constipation, flank pain, dysuria, hematuria, urinary  Frequency, nocturia, numbness, tingling, seizures,  Focal weakness, Loss of consciousness,  Tremor, insomnia, depression, anxiety, and suicidal ideation.      Objective:  BP 108/72  (BP Location: Left Arm, Patient Position: Sitting, Cuff Size: Normal)   Pulse 86   Temp 98 F (36.7 C) (Oral)   Resp 16   Ht 5\' 9"  (1.753 m)   Wt 202 lb (91.6 kg)   SpO2 96%   BMI 29.83 kg/m   BP Readings from Last 3 Encounters:  10/20/16 108/72  04/20/16 122/80  03/31/16 120/78    Wt Readings from Last 3 Encounters:  10/20/16 202 lb (91.6 kg)  04/20/16 196 lb (88.9 kg)  03/31/16 204 lb (92.5 kg)    General appearance: alert, cooperative and appears stated age Ears: normal TM's and external ear canals both ears Throat: lips, mucosa, and tongue normal; teeth and gums normal Neck: no adenopathy, no carotid bruit, supple, symmetrical, trachea midline and thyroid not enlarged, symmetric, no tenderness/mass/nodules Back: symmetric, no curvature. ROM normal. No CVA tenderness. Lungs: clear to auscultation bilaterally Heart: regular rate and rhythm, S1, S2 normal, no murmur, click, rub or gallop Abdomen: soft, non-tender; bowel sounds normal; no masses,  no organomegaly Pulses: 2+ and symmetric Skin: Skin color, texture, turgor normal. No rashes or lesions Lymph nodes: Cervical, supraclavicular, and axillary nodes normal.  Lab Results  Component Value Date   HGBA1C 7.3 (H) 10/16/2016   HGBA1C 7.1 (H) 08/14/2016   HGBA1C 6.7 (H) 04/20/2016    Lab Results  Component Value Date   CREATININE 1.45 10/16/2016   CREATININE 1.39 08/25/2016   CREATININE 1.68 (H) 08/14/2016    Lab Results  Component Value Date   WBC 8.1 10/16/2016   HGB 12.5 (L) 10/16/2016   HCT 37.1 (L) 10/16/2016   PLT 264.0 10/16/2016   GLUCOSE 134 (H) 10/16/2016   CHOL 92 10/16/2016   TRIG 206.0 (H) 10/16/2016   HDL 28.60 (L) 10/16/2016   LDLDIRECT 36.0 10/16/2016   LDLCALC 25 04/14/2016   ALT 14 10/16/2016   AST 15 10/16/2016   NA 141 10/16/2016   K 4.2 10/16/2016   CL 106 10/16/2016   CREATININE 1.45 10/16/2016   BUN 30 (H) 10/16/2016   CO2 27 10/16/2016   TSH 2.10 03/11/2016   PSA 1.43  03/11/2016   INR 2.5 (H) 10/16/2016   HGBA1C 7.3 (H) 10/16/2016   MICROALBUR 3.6 (H) 10/16/2016    Dg Hand Complete Left  Result Date: 06/11/2014 CLINICAL DATA:  Bilateral hand pain and swelling for 3 weeks. Fourth and fifth digit pain. EXAM: LEFT HAND - COMPLETE 3+ VIEW COMPARISON:  None. FINDINGS: No acute osseous or joint abnormality. No joint bodies narrowing, joint subluxation or erosive changes. IMPRESSION: No findings to explain the patient's pain. Electronically Signed   By: Leanna Battles M.D.   On: 06/11/2014 15:35   Dg Hand Complete Right  Result Date: 06/11/2014 CLINICAL DATA:  BILATERAL hand pain and swelling for 3 week, pain at lateral wrist, across palm,  and in fourth and fifth digits of both hands, polyarthritis EXAM: RIGHT HAND - COMPLETE 3+ VIEW COMPARISON:  None FINDINGS: Osseous demineralization. Joint spaces preserved. No acute fracture, dislocation, or bone destruction. No definite erosive or inflammatory changes. IMPRESSION: No acute abnormalities. Osseous demineralization diffusely. Electronically Signed   By: Ulyses Southward M.D.   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  He is not a candidate for Eliquis or Pradaxa due to his history.       Hypertension    Well controlled on current regimen. Renal function stable, no changes today.  Lab Results  Component Value Date   CREATININE 1.45 10/16/2016   Lab Results  Component Value Date   NA 141 10/16/2016   K 4.2 10/16/2016   CL 106 10/16/2016   CO2 27 10/16/2016         Relevant Medications   sildenafil (VIAGRA) 100 MG tablet   Hyperlipidemia    LDL is  < 70  with atorvastatin . LFTs are normal and he is tolerating therapy.   no changes today,   Lab Results    Component Value Date   CHOL 92 10/16/2016   HDL 28.60 (L) 10/16/2016   LDLCALC 25 04/14/2016   LDLDIRECT 36.0 10/16/2016   TRIG 206.0 (H) 10/16/2016   CHOLHDL 3 10/16/2016   Lab Results  Component Value Date   ALT 14 10/16/2016   AST 15 10/16/2016   ALKPHOS 39 10/16/2016   BILITOT 0.5 10/16/2016             Relevant Medications   sildenafil (VIAGRA) 100 MG tablet   Erectile dysfunction    Cautioned not to use > 100 mg of viagra in a 24 hour period.  Risk of priapism discussed. Medication refilled.       DM type 2, controlled, with complication (HCC)    ,diabetes is still under good control on current regimen, but  a1c has risen slightly compared to last time. other labs are normal. No medication changes were advised at this time, but he was advised to to limit the number of complex carbohydrates (starches, including white potatoes, Cereals, Cookies and cake) in your diet to 2 daily and advise to participate in exercise regularly .  return in 3 months   Lab Results  Component Value Date   HGBA1C 7.3* 05/14/2015   Lab Results  Component Value Date   MICROALBUR 2.2* 11/13/2014        Anticoagulation monitoring, INR range 2-3    Current dose is 2.5 mg daily , reduced from 3 mg daily . Repeat INR next week         A total of 25 minutes of face to face time was spent with patient more than half of which was spent in counselling about the above mentioned conditions  and coordination of care   I have discontinued Mr. Lafontaine sildenafil and sildenafil. I am also having him start on sildenafil. Additionally, I am having him maintain his naproxen sodium, fluocinonide-emollient, guaiFENesin-codeine, hydrochlorothiazide, metFORMIN, losartan, atorvastatin, warfarin, and allopurinol.  Meds ordered this encounter  Medications  . allopurinol (ZYLOPRIM) 300 MG tablet    Sig: Take 1 tablet (300 mg total) by mouth daily.  Dispense:  90 tablet    Refill:  2    Keep on file for  next refill due January 12 2017  . sildenafil (VIAGRA) 100 MG tablet    Sig: Take 0.5-1 tablets (50-100 mg total) by mouth daily as needed for erectile dysfunction.    Dispense:  5 tablet    Refill:  11    Medications Discontinued During This Encounter  Medication Reason  . sildenafil (REVATIO) 20 MG tablet Patient has not taken in last 30 days  . sildenafil (VIAGRA) 100 MG tablet Patient has not taken in last 30 days  . allopurinol (ZYLOPRIM) 300 MG tablet Reorder  . sildenafil (REVATIO) 20 MG tablet Patient has not taken in last 30 days    Follow-up: Return in about 3 months (around 01/20/2017) for follow up diabetes.   Sherlene ShamsULLO, Ryli Standlee L, MD

## 2016-10-20 NOTE — Telephone Encounter (Signed)
HE NEEDS TO LOOK ON HIS INSURANCE'S FORMULARY TO SEE IF CIALIS OR LEVITRA ARE COVERED.  I HAVE NO OTHER ALTERNATIVES

## 2016-10-21 MED ORDER — SILDENAFIL CITRATE 20 MG PO TABS: 20.0000 mg | ORAL_TABLET | Freq: Three times a day (TID) | ORAL | 2 refills | Status: DC

## 2016-10-21 NOTE — Telephone Encounter (Signed)
He can take  3 at a time,  Total dose 60 mg

## 2016-10-21 NOTE — Telephone Encounter (Signed)
Pt needs a rx for sitrate tablets (reutio 20mg ) for 30 tablets  sent to [facility] fax #[phone] phone # [phone]

## 2016-10-21 NOTE — Telephone Encounter (Signed)
Patient is requesting sildenafil 20 mg Revatio be sent in to fax listed need printed script.

## 2016-10-22 NOTE — Telephone Encounter (Signed)
Spoke with pt and informed him Dr. Melina Schoolsullo's message below. Pt gave a verbal understanding.   Rx has been printed, signed and faxed.

## 2016-10-26 ENCOUNTER — Other Ambulatory Visit (INDEPENDENT_AMBULATORY_CARE_PROVIDER_SITE_OTHER): Payer: Medicare Other

## 2016-10-26 DIAGNOSIS — Z7901 Long term (current) use of anticoagulants: Secondary | ICD-10-CM

## 2016-10-26 LAB — PROTIME-INR
INR: 1.9 ratio — ABNORMAL HIGH (ref 0.8–1.0)
Prothrombin Time: 19.9 s — ABNORMAL HIGH (ref 9.6–13.1)

## 2016-10-27 ENCOUNTER — Other Ambulatory Visit: Payer: Self-pay | Admitting: Internal Medicine

## 2016-10-27 ENCOUNTER — Telehealth: Payer: Self-pay | Admitting: Internal Medicine

## 2016-10-27 DIAGNOSIS — Z7901 Long term (current) use of anticoagulants: Secondary | ICD-10-CM

## 2016-10-27 MED ORDER — WARFARIN SODIUM 3 MG PO TABS: 3.0000 mg | ORAL_TABLET | Freq: Every day | ORAL | 0 refills | Status: DC

## 2016-10-27 NOTE — Telephone Encounter (Signed)
Received the paper he dropped off. Resent the rx for the sildenafil to the [facility] since he now has an account through them.

## 2016-10-27 NOTE — Telephone Encounter (Signed)
Pt dropped off a note about his sitrate tablet pills. He has a account number. Paper is up front in Dr. Melina Schoolsullo's color folder.

## 2016-11-24 ENCOUNTER — Other Ambulatory Visit: Payer: Self-pay | Admitting: Internal Medicine

## 2016-11-27 ENCOUNTER — Other Ambulatory Visit (INDEPENDENT_AMBULATORY_CARE_PROVIDER_SITE_OTHER): Payer: Medicare Other

## 2016-11-27 ENCOUNTER — Other Ambulatory Visit: Payer: Self-pay | Admitting: Internal Medicine

## 2016-11-27 DIAGNOSIS — Z7901 Long term (current) use of anticoagulants: Secondary | ICD-10-CM

## 2016-11-27 LAB — PROTIME-INR
INR: 1.7 ratio — ABNORMAL HIGH (ref 0.8–1.0)
Prothrombin Time: 18.8 s — ABNORMAL HIGH (ref 9.6–13.1)

## 2016-11-27 MED ORDER — WARFARIN SODIUM 3 MG PO TABS: 3.0000 mg | ORAL_TABLET | Freq: Every day | ORAL | 0 refills | Status: DC

## 2016-12-10 ENCOUNTER — Other Ambulatory Visit: Payer: Self-pay | Admitting: Radiology

## 2016-12-10 DIAGNOSIS — Z7901 Long term (current) use of anticoagulants: Principal | ICD-10-CM

## 2016-12-10 DIAGNOSIS — Z5181 Encounter for therapeutic drug level monitoring: Secondary | ICD-10-CM

## 2016-12-11 ENCOUNTER — Other Ambulatory Visit (INDEPENDENT_AMBULATORY_CARE_PROVIDER_SITE_OTHER): Payer: Medicare Other

## 2016-12-11 DIAGNOSIS — Z5181 Encounter for therapeutic drug level monitoring: Secondary | ICD-10-CM | POA: Diagnosis not present

## 2016-12-11 DIAGNOSIS — Z7901 Long term (current) use of anticoagulants: Secondary | ICD-10-CM

## 2016-12-11 LAB — PROTIME-INR
INR: 2.2 ratio — ABNORMAL HIGH (ref 0.8–1.0)
Prothrombin Time: 23.9 s — ABNORMAL HIGH (ref 9.6–13.1)

## 2016-12-11 NOTE — Addendum Note (Signed)
Addended by: Elise BenneBOOTH, Latarshia Jersey T on: 12/11/2016 08:41 AM   Modules accepted: Orders

## 2016-12-22 DIAGNOSIS — X32XXXA Exposure to sunlight, initial encounter: Secondary | ICD-10-CM | POA: Diagnosis not present

## 2016-12-22 DIAGNOSIS — L662 Folliculitis decalvans: Secondary | ICD-10-CM | POA: Diagnosis not present

## 2016-12-22 DIAGNOSIS — L570 Actinic keratosis: Secondary | ICD-10-CM | POA: Diagnosis not present

## 2017-01-08 ENCOUNTER — Other Ambulatory Visit: Payer: Self-pay | Admitting: Radiology

## 2017-01-08 DIAGNOSIS — Z5181 Encounter for therapeutic drug level monitoring: Secondary | ICD-10-CM

## 2017-01-08 DIAGNOSIS — Z7901 Long term (current) use of anticoagulants: Principal | ICD-10-CM

## 2017-01-11 ENCOUNTER — Other Ambulatory Visit (INDEPENDENT_AMBULATORY_CARE_PROVIDER_SITE_OTHER): Payer: Medicare Other

## 2017-01-11 ENCOUNTER — Other Ambulatory Visit: Payer: Self-pay | Admitting: Internal Medicine

## 2017-01-11 DIAGNOSIS — Z7901 Long term (current) use of anticoagulants: Secondary | ICD-10-CM | POA: Diagnosis not present

## 2017-01-11 DIAGNOSIS — Z5181 Encounter for therapeutic drug level monitoring: Secondary | ICD-10-CM | POA: Diagnosis not present

## 2017-01-11 LAB — PROTIME-INR
INR: 2.6 ratio — ABNORMAL HIGH (ref 0.8–1.0)
Prothrombin Time: 27.6 s — ABNORMAL HIGH (ref 9.6–13.1)

## 2017-01-12 ENCOUNTER — Telehealth: Payer: Self-pay

## 2017-01-12 DIAGNOSIS — Z7901 Long term (current) use of anticoagulants: Secondary | ICD-10-CM

## 2017-01-12 MED ORDER — WARFARIN SODIUM 4 MG PO TABS: 4.0000 mg | ORAL_TABLET | Freq: Every day | ORAL | 1 refills | Status: DC

## 2017-01-12 NOTE — Telephone Encounter (Signed)
Spoke with pt and informed him of his lab results. The pt stated that he is supposed to be doing 6mg  on Mondays and Fridays and 2.5mg  all other days. However he stated that he ran out of the 3mg  tablets but still had the 2.5mg , so he stated that he has been taking 3 2.5mg  on Mondays and Fridays and 2.5mg  all other days. Do you want him to continue taking the 3 2.5mg  tablets on Mondays and Fridays or do you want him to go back to the 6mg  on Mondays and Fridays? Please advise.

## 2017-01-12 NOTE — Telephone Encounter (Signed)
Spoke with pt and informed him of the medication change. The pt gave verbal understanding and we rescheduled the pt's lab appt for two weeks instead of a month out.

## 2017-01-12 NOTE — Telephone Encounter (Signed)
That regimen that he has been taking averages out  to 4 mg daily..  I will send in a new rx for 4 mg tablets .  One tablet daily  Repeat INR in 2 weeks

## 2017-01-12 NOTE — Telephone Encounter (Signed)
-----   Message from Sherlene Shams, MD sent at 01/12/2017  7:23 AM EDT ----- Coumadin level is therapeutic,  Continue current regimen and repeat PT/INR in one month

## 2017-01-12 NOTE — Addendum Note (Signed)
Addended by: Sherlene Shams on: 01/12/2017 11:21 AM   Modules accepted: Orders

## 2017-01-21 ENCOUNTER — Ambulatory Visit: Payer: Medicare Other | Admitting: Internal Medicine

## 2017-01-27 ENCOUNTER — Ambulatory Visit (INDEPENDENT_AMBULATORY_CARE_PROVIDER_SITE_OTHER): Payer: Medicare Other | Admitting: Internal Medicine

## 2017-01-27 ENCOUNTER — Encounter: Payer: Self-pay | Admitting: Internal Medicine

## 2017-01-27 VITALS — BP 128/76 | HR 61 | Temp 98.2°F | Resp 14 | Ht 69.0 in | Wt 198.0 lb

## 2017-01-27 DIAGNOSIS — E118 Type 2 diabetes mellitus with unspecified complications: Secondary | ICD-10-CM

## 2017-01-27 DIAGNOSIS — E1169 Type 2 diabetes mellitus with other specified complication: Secondary | ICD-10-CM

## 2017-01-27 DIAGNOSIS — Z7901 Long term (current) use of anticoagulants: Secondary | ICD-10-CM | POA: Diagnosis not present

## 2017-01-27 DIAGNOSIS — E785 Hyperlipidemia, unspecified: Secondary | ICD-10-CM

## 2017-01-27 LAB — COMPREHENSIVE METABOLIC PANEL
ALT: 18 U/L (ref 0–53)
AST: 18 U/L (ref 0–37)
Albumin: 3.7 g/dL (ref 3.5–5.2)
Alkaline Phosphatase: 45 U/L (ref 39–117)
BUN: 30 mg/dL — ABNORMAL HIGH (ref 6–23)
CO2: 27 mEq/L (ref 19–32)
Calcium: 9.3 mg/dL (ref 8.4–10.5)
Chloride: 102 mEq/L (ref 96–112)
Creatinine, Ser: 1.31 mg/dL (ref 0.40–1.50)
GFR: 56.62 mL/min — ABNORMAL LOW (ref >60.00)
Glucose, Bld: 119 mg/dL — ABNORMAL HIGH (ref 70–99)
Potassium: 4.3 mEq/L (ref 3.5–5.1)
Sodium: 137 mEq/L (ref 135–145)
Total Bilirubin: 0.4 mg/dL (ref 0.2–1.2)
Total Protein: 6.6 g/dL (ref 6.0–8.3)

## 2017-01-27 LAB — PROTIME-INR
INR: 4.6 ratio — ABNORMAL HIGH (ref 0.8–1.0)
Prothrombin Time: 48.7 s — ABNORMAL HIGH (ref 9.6–13.1)

## 2017-01-27 LAB — HEMOGLOBIN A1C: Hgb A1c MFr Bld: 7.1 % — ABNORMAL HIGH (ref 4.6–6.5)

## 2017-01-27 NOTE — Patient Instructions (Addendum)
Good to see you!  If your A1c is > 7.0 today,  We will have you return to learn how to check your blood sugars

## 2017-01-27 NOTE — Progress Notes (Signed)
Subjective:  Patient ID: Stephen Kelly, male    DOB: 03-07-1941  Age: 76 y.o. MRN: [id number]  CC: The primary encounter diagnosis was DM type 2, controlled, with complication (HCC). Diagnoses of Anticoagulant long-term use, Controlled type 2 diabetes mellitus with complication, without long-term current use of insulin (HCC), Hyperlipidemia associated with type 2 diabetes mellitus (HCC), Long term current use of anticoagulant therapy, and Anticoagulation monitoring, INR range 2-3 were also pertinent to this visit.  HPI Stephen Kelly presents for 3 month follow up on diabetes.  Patient has no complaints today.  Patient is following a low glycemic index diet and taking all prescribed medications regularly without side effects.  He doesn't check blood sugars.  Patient is exercising about 3 times per week and not intentionally trying to lose weight .  Patient has had an eye exam in the last 12 months and checks feet regularly for signs of infection.  Patient does not walk barefoot outside,  And denies any numbness tingling or burning in feet. Patient is up to date on all recommended vaccinations  No recent gout flares Still mowing grass 4-5 days per week in the early morning .  No alcohol  No tobacco use. .   Outpatient Medications Prior to Visit  Medication Sig Dispense Refill  . allopurinol (ZYLOPRIM) 300 MG tablet Take 1 tablet (300 mg total) by mouth daily. 90 tablet 2  . atorvastatin (LIPITOR) 20 MG tablet TAKE ONE (1) TABLET BY MOUTH EVERY DAY 90 tablet 1  . fluocinonide-emollient (LIDEX-E) 0.05 % cream Apply 1 application topically 2 (two) times daily. 60 g 2  . guaiFENesin-codeine (CHERATUSSIN AC) 100-10 MG/5ML syrup Take 5 mLs by mouth 3 (three) times daily as needed for cough. 180 mL 0  . hydrochlorothiazide (HYDRODIURIL) 25 MG tablet TAKE ONE (1) TABLET BY MOUTH EVERY DAY 90 tablet 3  . losartan (COZAAR) 100 MG tablet TAKE ONE (1) TABLET BY MOUTH EVERY DAY 90 tablet 0  . metFORMIN  (GLUCOPHAGE) 850 MG tablet TAKE ONE (1) TABLET BY MOUTH TWO (2) TIMES DAILY WITH FOOD 60 tablet 2  . naproxen sodium (ANAPROX) 220 MG tablet Take 220 mg by mouth 2 (two) times daily with a meal.    . warfarin (COUMADIN) 4 MG tablet Take 1 tablet (4 mg total) by mouth daily. 90 tablet 1  . allopurinol (ZYLOPRIM) 300 MG tablet TAKE 1 TABLET BY MOUTH EVERY DAY (Patient not taking: Reported on 01/27/2017) 90 tablet 0  . sildenafil (REVATIO) 20 MG tablet Take 1 tablet (20 mg total) by mouth 3 (three) times daily. (Patient not taking: Reported on 01/27/2017) 30 tablet 2   No facility-administered medications prior to visit.     Review of Systems;  Patient denies headache, fevers, malaise, unintentional weight loss, skin rash, eye pain, sinus congestion and sinus pain, sore throat, dysphagia,  hemoptysis , cough, dyspnea, wheezing, chest pain, palpitations, orthopnea, edema, abdominal pain, nausea, melena, diarrhea, constipation, flank pain, dysuria, hematuria, urinary  Frequency, nocturia, numbness, tingling, seizures,  Focal weakness, Loss of consciousness,  Tremor, insomnia, depression, anxiety, and suicidal ideation.      Objective:  BP 128/76 (BP Location: Left Arm, Patient Position: Sitting, Cuff Size: Normal)   Pulse 61   Temp 98.2 F (36.8 C) (Oral)   Resp 14   Ht 5\' 9"  (1.753 m)   Wt 198 lb (89.8 kg)   SpO2 96%   BMI 29.24 kg/m   BP Readings from Last 3 Encounters:  01/27/17 128/76  10/20/16 108/72  04/20/16 122/80    Wt Readings from Last 3 Encounters:  01/27/17 198 lb (89.8 kg)  10/20/16 202 lb (91.6 kg)  04/20/16 196 lb (88.9 kg)    General appearance: alert, cooperative and appears stated age Ears: normal TM's and external ear canals both ears Throat: lips, mucosa, and tongue normal; teeth and gums normal Neck: no adenopathy, no carotid bruit, supple, symmetrical, trachea midline and thyroid not enlarged, symmetric, no tenderness/mass/nodules Back: symmetric, no  curvature. ROM normal. No CVA tenderness. Lungs: clear to auscultation bilaterally Heart: regular rate and rhythm, S1, S2 normal, no murmur, click, rub or gallop Abdomen: soft, non-tender; bowel sounds normal; no masses,  no organomegaly Pulses: 2+ and symmetric Skin: Skin color, texture, turgor normal. No rashes or lesions Lymph nodes: Cervical, supraclavicular, and axillary nodes normal.  Lab Results  Component Value Date   HGBA1C 7.1 (H) 01/27/2017   HGBA1C 7.3 (H) 10/16/2016   HGBA1C 7.1 (H) 08/14/2016    Lab Results  Component Value Date   CREATININE 1.31 01/27/2017   CREATININE 1.45 10/16/2016   CREATININE 1.39 08/25/2016    Lab Results  Component Value Date   WBC 8.1 10/16/2016   HGB 12.5 (L) 10/16/2016   HCT 37.1 (L) 10/16/2016   PLT 264.0 10/16/2016   GLUCOSE 119 (H) 01/27/2017   CHOL 92 10/16/2016   TRIG 206.0 (H) 10/16/2016   HDL 28.60 (L) 10/16/2016   LDLDIRECT 36.0 10/16/2016   LDLCALC 25 04/14/2016   ALT 18 01/27/2017   AST 18 01/27/2017   NA 137 01/27/2017   K 4.3 01/27/2017   CL 102 01/27/2017   CREATININE 1.31 01/27/2017   BUN 30 (H) 01/27/2017   CO2 27 01/27/2017   TSH 2.10 03/11/2016   PSA 1.43 03/11/2016   INR 4.6 (H) 01/27/2017   HGBA1C 7.1 (H) 01/27/2017   MICROALBUR 3.6 (H) 10/16/2016     Assessment & Plan:   Problem List Items Addressed This Visit    Anticoagulation monitoring, INR range 2-3    His INR is 4.6 on 27 mg weekly (6 mg x 2,  3 mg x 5) .  Will reduce dose to 3 mg daily and repeat INR in 2 weeks       Hyperlipidemia associated with type 2 diabetes mellitus (HCC)    LDL is  < 70  with atorvastatin . LFTs are normal and he is tolerating therapy.   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 INR is supratherapeutic on  Lab Results  Component Value Date   INR 4.6 (H) 01/27/2017   INR 2.6 (H) 01/11/2017   INR 2.2 (H) 12/11/2016         Nephropathy, diabetic (HCC) - Primary    Improving control on diet alone. Continue losartan  for nephropathy .  No change s today    Lab Results  Component Value Date   HGBA1C 7.1 (H) 01/27/2017   Lab Results  Component Value Date   MICROALBUR 3.6 (H) 10/16/2016    ,        Other Visit Diagnoses    Anticoagulant long-term use         A total of 25 minutes of face to face time was spent with patient more than half of which was spent in counselling about the above mentioned conditions  and coordination of care   I have discontinued Mr. Rhunette Croftayne's sildenafil. I have also changed his warfarin. Additionally, I am having him maintain his naproxen sodium, fluocinonide-emollient, guaiFENesin-codeine, hydrochlorothiazide, atorvastatin, allopurinol, metFORMIN, and losartan.  Meds ordered this encounter  Medications  . warfarin (COUMADIN) 3 MG tablet    Sig: Take 1 tablet (3 mg total) by mouth daily.    Dispense:  90 tablet    Refill:  0    Medications Discontinued During This Encounter  Medication Reason  . allopurinol (ZYLOPRIM) 300 MG tablet Patient has not taken in last 30 days  . sildenafil (REVATIO) 20 MG tablet Patient has not taken in last 30 days  . warfarin (COUMADIN) 4 MG tablet Reorder    Follow-up: Return in about 6 weeks (around 03/10/2017) for CPE late october .   Sherlene ShamsULLO, Symphanie Cederberg L, MD

## 2017-01-28 MED ORDER — WARFARIN SODIUM 3 MG PO TABS: 3.0000 mg | ORAL_TABLET | Freq: Every day | ORAL | 0 refills | Status: DC

## 2017-01-28 NOTE — Assessment & Plan Note (Signed)
His INR is 4.6 on 27 mg weekly (6 mg x 2,  3 mg x 5) .  Will reduce dose to 3 mg daily and repeat INR in 2 weeks

## 2017-01-28 NOTE — Assessment & Plan Note (Addendum)
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 INR is supratherapeutic on  Lab Results  Component Value Date   INR 4.6 (H) 01/27/2017   INR 2.6 (H) 01/11/2017   INR 2.2 (H) 12/11/2016

## 2017-01-28 NOTE — Assessment & Plan Note (Addendum)
Improving control on diet alone. Continue losartan for nephropathy .  No change s today    Lab Results  Component Value Date   HGBA1C 7.1 (H) 01/27/2017   Lab Results  Component Value Date   MICROALBUR 3.6 (H) 10/16/2016    ,

## 2017-01-28 NOTE — Assessment & Plan Note (Signed)
LDL is  < 70  with atorvastatin . LFTs are normal and he is tolerating therapy.   no changes today,   Lab Results  Component Value Date   CHOL 92 10/16/2016   HDL 28.60 (L) 10/16/2016   LDLCALC 25 04/14/2016   LDLDIRECT 36.0 10/16/2016   TRIG 206.0 (H) 10/16/2016   CHOLHDL 3 10/16/2016   Lab Results  Component Value Date   ALT 18 01/27/2017   AST 18 01/27/2017   ALKPHOS 45 01/27/2017   BILITOT 0.4 01/27/2017

## 2017-01-29 ENCOUNTER — Other Ambulatory Visit: Payer: Self-pay

## 2017-02-11 ENCOUNTER — Other Ambulatory Visit: Payer: Self-pay | Admitting: Radiology

## 2017-02-11 ENCOUNTER — Other Ambulatory Visit: Payer: Medicare Other

## 2017-02-11 DIAGNOSIS — Z5181 Encounter for therapeutic drug level monitoring: Secondary | ICD-10-CM

## 2017-02-11 DIAGNOSIS — Z7901 Long term (current) use of anticoagulants: Principal | ICD-10-CM

## 2017-02-12 ENCOUNTER — Other Ambulatory Visit: Payer: Self-pay | Admitting: Internal Medicine

## 2017-02-12 ENCOUNTER — Telehealth: Payer: Self-pay | Admitting: *Deleted

## 2017-02-12 NOTE — Telephone Encounter (Signed)
Pt stated he picked up a script up from the pharmacy for warfarin . Is this the correct dosage  Pt contact [phone]

## 2017-02-12 NOTE — Telephone Encounter (Signed)
Patient was notified per last Note coumadin regimen 3 mg daily. And that hee needs INR rechecked.

## 2017-02-15 ENCOUNTER — Other Ambulatory Visit (INDEPENDENT_AMBULATORY_CARE_PROVIDER_SITE_OTHER): Payer: Medicare Other

## 2017-02-15 DIAGNOSIS — Z5181 Encounter for therapeutic drug level monitoring: Secondary | ICD-10-CM | POA: Diagnosis not present

## 2017-02-15 DIAGNOSIS — Z7901 Long term (current) use of anticoagulants: Secondary | ICD-10-CM

## 2017-02-15 LAB — PROTIME-INR
INR: 3.9 ratio — ABNORMAL HIGH (ref 0.8–1.0)
Prothrombin Time: 41.1 s — ABNORMAL HIGH (ref 9.6–13.1)

## 2017-02-25 ENCOUNTER — Other Ambulatory Visit: Payer: Self-pay | Admitting: Internal Medicine

## 2017-03-03 ENCOUNTER — Other Ambulatory Visit: Payer: Self-pay | Admitting: Radiology

## 2017-03-03 DIAGNOSIS — Z7901 Long term (current) use of anticoagulants: Principal | ICD-10-CM

## 2017-03-03 DIAGNOSIS — Z5181 Encounter for therapeutic drug level monitoring: Secondary | ICD-10-CM

## 2017-03-04 ENCOUNTER — Other Ambulatory Visit (INDEPENDENT_AMBULATORY_CARE_PROVIDER_SITE_OTHER): Payer: Medicare Other

## 2017-03-04 DIAGNOSIS — Z5181 Encounter for therapeutic drug level monitoring: Secondary | ICD-10-CM

## 2017-03-04 DIAGNOSIS — Z7901 Long term (current) use of anticoagulants: Secondary | ICD-10-CM | POA: Diagnosis not present

## 2017-03-04 LAB — PROTIME-INR
INR: 2.2 ratio — ABNORMAL HIGH (ref 0.8–1.0)
Prothrombin Time: 23.5 s — ABNORMAL HIGH (ref 9.6–13.1)

## 2017-03-09 ENCOUNTER — Telehealth: Payer: Self-pay | Admitting: *Deleted

## 2017-03-09 DIAGNOSIS — Z7901 Long term (current) use of anticoagulants: Secondary | ICD-10-CM

## 2017-03-09 NOTE — Telephone Encounter (Signed)
Standing labs placed for Pt/INR

## 2017-03-11 ENCOUNTER — Other Ambulatory Visit (INDEPENDENT_AMBULATORY_CARE_PROVIDER_SITE_OTHER): Payer: Medicare Other

## 2017-03-11 DIAGNOSIS — Z7901 Long term (current) use of anticoagulants: Secondary | ICD-10-CM

## 2017-03-11 LAB — PROTIME-INR
INR: 1.7 ratio — ABNORMAL HIGH (ref 0.8–1.0)
Prothrombin Time: 18.1 s — ABNORMAL HIGH (ref 9.6–13.1)

## 2017-03-12 ENCOUNTER — Other Ambulatory Visit: Payer: Self-pay | Admitting: Internal Medicine

## 2017-03-12 DIAGNOSIS — Z7901 Long term (current) use of anticoagulants: Secondary | ICD-10-CM

## 2017-03-12 MED ORDER — WARFARIN SODIUM 4 MG PO TABS: 4.0000 mg | ORAL_TABLET | Freq: Every day | ORAL | 0 refills | Status: DC

## 2017-03-16 DIAGNOSIS — L570 Actinic keratosis: Secondary | ICD-10-CM | POA: Diagnosis not present

## 2017-03-16 DIAGNOSIS — X32XXXA Exposure to sunlight, initial encounter: Secondary | ICD-10-CM | POA: Diagnosis not present

## 2017-03-16 DIAGNOSIS — L662 Folliculitis decalvans: Secondary | ICD-10-CM | POA: Diagnosis not present

## 2017-03-17 ENCOUNTER — Ambulatory Visit (INDEPENDENT_AMBULATORY_CARE_PROVIDER_SITE_OTHER): Payer: Medicare Other | Admitting: Internal Medicine

## 2017-03-17 ENCOUNTER — Encounter: Payer: Self-pay | Admitting: Internal Medicine

## 2017-03-17 VITALS — BP 134/72 | HR 80 | Temp 97.9°F | Resp 15 | Ht 69.0 in | Wt 198.0 lb

## 2017-03-17 DIAGNOSIS — Z23 Encounter for immunization: Secondary | ICD-10-CM | POA: Diagnosis not present

## 2017-03-17 DIAGNOSIS — E7800 Pure hypercholesterolemia, unspecified: Secondary | ICD-10-CM

## 2017-03-17 DIAGNOSIS — Z0000 Encounter for general adult medical examination without abnormal findings: Secondary | ICD-10-CM | POA: Diagnosis not present

## 2017-03-17 DIAGNOSIS — Z125 Encounter for screening for malignant neoplasm of prostate: Secondary | ICD-10-CM | POA: Diagnosis not present

## 2017-03-17 DIAGNOSIS — Z7901 Long term (current) use of anticoagulants: Secondary | ICD-10-CM

## 2017-03-17 DIAGNOSIS — Z299 Encounter for prophylactic measures, unspecified: Secondary | ICD-10-CM

## 2017-03-17 LAB — LIPID PANEL
Cholesterol: 89 mg/dL (ref 0–200)
HDL: 34.2 mg/dL — ABNORMAL LOW (ref >39.00)
LDL Cholesterol: 17 mg/dL (ref 0–99)
NonHDL: 55.21
Total CHOL/HDL Ratio: 3
Triglycerides: 191 mg/dL — ABNORMAL HIGH (ref 0.0–149.0)
VLDL: 38.2 mg/dL (ref 0.0–40.0)

## 2017-03-17 LAB — PROTIME-INR
INR: 3.2 ratio — ABNORMAL HIGH (ref 0.8–1.0)
Prothrombin Time: 34.7 s — ABNORMAL HIGH (ref 9.6–13.1)

## 2017-03-17 LAB — PSA, MEDICARE: PSA: 1.19 ng/ml (ref 0.10–4.00)

## 2017-03-17 NOTE — Progress Notes (Signed)
Patient ID: Stephen Kelly, male    DOB: 08/09/1940  Age: 76 y.o. MRN: [id number]  The patient is here for annual  Preventive examination and management of other chronic and acute problems.   Colonoscopy 2009, discussed cologuard  Due for PSA  Losing weight,  Only eatrst 2 times daily most days Wife Stephen Kelly passed last year Feb 26 2016  stll misses her . Had several instances of seeing her in the house after she passed.  No communication, just glimpses of her sitting, etc. [name].  Having occasional insomnia ,  Not frequent.    Lab Results  Component Value Date   PSA 1.19 03/17/2017   PSA 1.43 03/11/2016   PSA 1.15 03/11/2015     The risk factors are reflected in the social history.  The roster of all physicians providing medical care to patient - is listed in the Snapshot section of the chart.  Activities of daily living:  The patient is 100% independent in all ADLs: dressing, toileting, feeding as well as independent mobility  Home safety : The patient has smoke detectors in the home. They wear seatbelts.  There are no firearms at home. There is no violence in the home.   There is no risks for hepatitis, STDs or HIV. There is no   history of blood transfusion. They have no travel history to infectious disease endemic areas of the world.  The patient has seen their dentist in the last six month. They have not seen their eye doctor in the last year.  Has early cataract,  No retinopathy  Nov 2017.   They admit to slight hearing difficulty with regard to whispered voices and some television programs.  They have deferred audiologic testing in the last year.  They do not  have excessive sun exposure. Discussed the need for sun protection: hats, long sleeves and use of sunscreen if there is significant sun exposure.   Diet: the importance of a healthy diet is discussed. They do have a healthy diet.  The benefits of regular aerobic exercise were discussed. he walks 4 times per  week ,  20 minutes. And  Does yardwork for other people    Depression screen: there are no signs or vegative symptoms of depression- irritability, change in appetite, anhedonia, sadness/tearfullness.  Cognitive assessment: the patient manages all their financial and personal affairs and is actively engaged. They could relate day,date,year and events; recalled 2/3 objects at 3 minutes; performed clock-face test normally.  The following portions of the patient's history were reviewed and updated as appropriate: allergies, current medications, past family history, past medical history,  past surgical history, past social history  and problem list.  Visual acuity was not assessed per patient preference since she has regular follow up with her ophthalmologist. Hearing and body mass index were assessed and reviewed.   During the course of the visit the patient was educated and counseled about appropriate screening and preventive services including : fall prevention , diabetes screening, nutrition counseling, colorectal cancer screening, and recommended immunizations.    CC: The primary encounter diagnosis was Prostate cancer screening. Diagnoses of Encounter for immunization, Anticoagulation monitoring, INR range 2-3, Pure hypercholesterolemia, Long term current use of anticoagulant therapy, and Encounter for preventive measure were also pertinent to this visit.  1) anticoagulation:  Has been taking  4 mg  Of coumadin  since 10/19  2018  [name] has a past medical history of Clotting disorder (HCC) (2011); Gout, arthritis; History of  renal calculi (2001); Hyperlipidemia; Hypertension; 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HCC); Patent foramen ovale; and Pulmonary nodule, right (2011).   He has a past surgical history that includes Thrombectomy.   His family history includes Cancer in his mother; Diabetes in his mother; Heart disease in his mother; Hypertension in his mother; Mental illness in  his father; Stroke in his paternal grandmother.He reports that he has never smoked. He has never used smokeless tobacco. He reports that he does not drink alcohol or use drugs.  Outpatient Medications Prior to Visit  Medication Sig Dispense Refill  . allopurinol (ZYLOPRIM) 300 MG tablet Take 1 tablet (300 mg total) by mouth daily. 90 tablet 2  . atorvastatin (LIPITOR) 20 MG tablet TAKE ONE (1) TABLET BY MOUTH EVERY DAY 90 tablet 1  . fluocinonide-emollient (LIDEX-E) 0.05 % cream Apply 1 application topically 2 (two) times daily. 60 g 2  . guaiFENesin-codeine (CHERATUSSIN AC) 100-10 MG/5ML syrup Take 5 mLs by mouth 3 (three) times daily as needed for cough. 180 mL 0  . hydrochlorothiazide (HYDRODIURIL) 25 MG tablet TAKE ONE (1) TABLET BY MOUTH EVERY DAY 90 tablet 3  . losartan (COZAAR) 100 MG tablet TAKE ONE TABLET BY MOUTH EVERY DAY 90 tablet 3  . metFORMIN (GLUCOPHAGE) 850 MG tablet TAKE ONE TABLET BY MOUTH TWICE DAILY WITH FOOD 60 tablet 3  . naproxen sodium (ANAPROX) 220 MG tablet Take 220 mg by mouth 2 (two) times daily with a meal.    . warfarin (COUMADIN) 4 MG tablet Take 1 tablet (4 mg total) by mouth daily. 90 tablet 0   No facility-administered medications prior to visit.     Review of Systems   Patient denies headache, fevers, malaise, unintentional weight loss, skin rash, eye pain, sinus congestion and sinus pain, sore throat, dysphagia,  hemoptysis , cough, dyspnea, wheezing, chest pain, palpitations, orthopnea, edema, abdominal pain, nausea, melena, diarrhea, constipation, flank pain, dysuria, hematuria, urinary  Frequency, nocturia, numbness, tingling, seizures,  Focal weakness, Loss of consciousness,  Tremor, insomnia, depression, anxiety, and suicidal ideation.      Objective:  BP 134/72 (BP Location: Left Arm, Patient Position: Sitting, Cuff Size: Normal)   Pulse 80   Temp 97.9 F (36.6 C) (Oral)   Resp 15   Ht 5\' 9"  (1.753 m)   Wt 198 lb (89.8 kg)   SpO2 97%   BMI  29.24 kg/m   Physical Exam   General Appearance:    Alert, cooperative, no distress, appears stated age  Head:    Normocephalic, without obvious abnormality, atraumatic  Eyes:    PERRL, conjunctiva/corneas clear, EOM's intact, fundi    benign, both eyes  Ears:    Normal TM's and external ear canals, both ears  Nose:   Nares normal, septum midline, mucosa normal, no drainage    or sinus tenderness  Throat:   Lips, mucosa, and tongue normal; teeth and gums normal  Neck:   Supple, symmetrical, trachea midline, no adenopathy;    thyroid:  no enlargement/tenderness/nodules; no carotid   bruit or JVD  Back:     Symmetric, no curvature, ROM normal, no CVA tenderness  Lungs:     Clear to auscultation bilaterally, respirations unlabored  Chest Wall:    No tenderness or deformity   Heart:    Regular rate and rhythm, S1 and S2 normal, no murmur, rub   or gallop  Breast Exam:    No tenderness, masses, or nipple abnormality  Abdomen:     Soft, non-tender, bowel  sounds active all four quadrants,    no masses, no organomegaly  Genitalia:    Pelvic: cervix normal in appearance, external genitalia normal, no adnexal masses or tenderness, no cervical motion tenderness, rectovaginal septum normal, uterus normal size, shape, and consistency and vagina normal without discharge  Extremities:   Extremities normal, atraumatic, no cyanosis or edema  Pulses:   2+ and symmetric all extremities  Skin:   Skin color, texture, turgor normal, no rashes or lesions  Lymph nodes:   Cervical, supraclavicular, and axillary nodes normal  Neurologic:   CNII-XII intact, normal strength, sensation and reflexes    throughout      Assessment & Plan:   Problem List Items Addressed This Visit    Anticoagulation monitoring, INR range 2-3    His INR is 4.6 on 27 mg weekly (6 mg x 2,  3 mg x 5) .  Will reduce dose to 3 mg daily and repeat INR in 2 weeks   Lab Results  Component Value Date   INR 3.2 (H) 03/17/2017   INR  1.7 (H) 03/11/2017   INR 2.2 (H) 03/04/2017         Relevant Orders   INR/PT (Completed)   Encounter for preventive measure    Annual comprehensive preventive exam was done as well as an evaluation and management of acute and chronic conditions .  During the course of the visit the patient was educated and counseled about appropriate screening and preventive services including :  diabetes screening, lipid analysis with projected  10 year  risk for CAD , nutrition counseling, prostate and colorectal cancer screening, and recommended immunizations.  Printed recommendations for health maintenance screenings was given. He is at average risk for colon CA ;Cologuard ordered.   Lab Results  Component Value Date   PSA 1.19 03/17/2017   PSA 1.43 03/11/2016   PSA 1.15 03/11/2015          Hyperlipidemia   Relevant Orders   Lipid panel (Completed)   Long term current use of anticoagulant therapy    Fall risk, alcohol history and cognitive function assessed.  He does not take NSAIDs. He is safe to continue use of warfarin.  Lab Results  Component Value Date   WBC 8.1 10/16/2016   HGB 12.5 (L) 10/16/2016   HCT 37.1 (L) 10/16/2016   MCV 88.4 10/16/2016   PLT 264.0 10/16/2016         Other Visit Diagnoses    Prostate cancer screening    -  Primary   Relevant Orders   PSA, Medicare (Completed)   Encounter for immunization       Relevant Orders   Flu vaccine HIGH DOSE PF (Completed)      I am having Mr. Premo maintain his naproxen sodium, fluocinonide-emollient, guaiFENesin-codeine, hydrochlorothiazide, atorvastatin, allopurinol, metFORMIN, losartan, and warfarin.  No orders of the defined types were placed in this encounter.   There are no discontinued medications.  Follow-up: No Follow-up on file.   Sherlene Shams, MD

## 2017-03-17 NOTE — Patient Instructions (Signed)
We are checking you for prostate  Cancer and  Cholesterol today along with your coumadin level   Return in 3 months for diabetes follow up  Colon cancer screening is due next due.  We will  use the home test    Health Maintenance, Male A healthy lifestyle and preventive care is important for your health and wellness. Ask your health care provider about what schedule of regular examinations is right for you. What should I know about weight and diet? Eat a Healthy Diet  Eat plenty of vegetables, fruits, whole grains, low-fat dairy products, and lean protein.  Do not eat a lot of foods high in solid fats, added sugars, or salt.  Maintain a Healthy Weight Regular exercise can help you achieve or maintain a healthy weight. You should:  Do at least 150 minutes of exercise each week. The exercise should increase your heart rate and make you sweat (moderate-intensity exercise).  Do strength-training exercises at least twice a week.  Watch Your Levels of Cholesterol and Blood Lipids  Have your blood tested for lipids and cholesterol every 5 years starting at 75 years of age. If you are at high risk for heart disease, you should start having your blood tested when you are 75 years old. You may need to have your cholesterol levels checked more often if: ? Your lipid or cholesterol levels are high. ? You are older than 75 years of age. ? You are at high risk for heart disease.  What should I know about cancer screening? Many types of cancers can be detected early and may often be prevented. Lung Cancer  You should be screened every year for lung cancer if: ? You are a current smoker who has smoked for at least 30 years. ? You are a former smoker who has quit within the past 15 years.  Talk to your health care provider about your screening options, when you should start screening, and how often you should be screened.  Colorectal Cancer  Routine colorectal cancer screening usually begins  at 75 years of age and should be repeated every 5-10 years until you are 75 years old. You may need to be screened more often if early forms of precancerous polyps or small growths are found. Your health care provider may recommend screening at an earlier age if you have risk factors for colon cancer.  Your health care provider may recommend using home test kits to check for hidden blood in the stool.  A small camera at the end of a tube can be used to examine your colon (sigmoidoscopy or colonoscopy). This checks for the earliest forms of colorectal cancer.  Prostate and Testicular Cancer  Depending on your age and overall health, your health care provider may do certain tests to screen for prostate and testicular cancer.  Talk to your health care provider about any symptoms or concerns you have about testicular or prostate cancer.  Skin Cancer  Check your skin from head to toe regularly.  Tell your health care provider about any new moles or changes in moles, especially if: ? There is a change in a mole's size, shape, or color. ? You have a mole that is larger than a pencil eraser.  Always use sunscreen. Apply sunscreen liberally and repeat throughout the day.  Protect yourself by wearing long sleeves, pants, a wide-brimmed hat, and sunglasses when outside.  What should I know about heart disease, diabetes, and high blood pressure?  If you are 18-39  years of age, have your blood pressure checked every 3-5 years. If you are 37 years of age or older, have your blood pressure checked every year. You should have your blood pressure measured twice-once when you are at a hospital or clinic, and once when you are not at a hospital or clinic. Record the average of the two measurements. To check your blood pressure when you are not at a hospital or clinic, you can use: ? An automated blood pressure machine at a pharmacy. ? A home blood pressure monitor.  Talk to your health care provider about  your target blood pressure.  If you are between 89-53 years old, ask your health care provider if you should take aspirin to prevent heart disease.  Have regular diabetes screenings by checking your fasting blood sugar level. ? If you are at a normal weight and have a low risk for diabetes, have this test once every three years after the age of 75. ? If you are overweight and have a high risk for diabetes, consider being tested at a younger age or more often.  A one-time screening for abdominal aortic aneurysm (AAA) by ultrasound is recommended for men aged 65-75 years who are current or former smokers. What should I know about preventing infection? Hepatitis B If you have a higher risk for hepatitis B, you should be screened for this virus. Talk with your health care provider to find out if you are at risk for hepatitis B infection. Hepatitis C Blood testing is recommended for:  Everyone born from [birth year] through 1964.  Anyone with known risk factors for hepatitis C.  Sexually Transmitted Diseases (STDs)  You should be screened each year for STDs including gonorrhea and chlamydia if: ? You are sexually active and are younger than 75 years of age. ? You are older than 75 years of age and your health care provider tells you that you are at risk for this type of infection. ? Your sexual activity has changed since you were last screened and you are at an increased risk for chlamydia or gonorrhea. Ask your health care provider if you are at risk.  Talk with your health care provider about whether you are at high risk of being infected with HIV. Your health care provider may recommend a prescription medicine to help prevent HIV infection.  What else can I do?  Schedule regular health, dental, and eye exams.  Stay current with your vaccines (immunizations).  Do not use any tobacco products, such as cigarettes, chewing tobacco, and e-cigarettes. If you need help quitting, ask your health care  provider.  Limit alcohol intake to no more than 2 drinks per day. One drink equals 12 ounces of beer, 5 ounces of wine, or 1 ounces of hard liquor.  Do not use street drugs.  Do not share needles.  Ask your health care provider for help if you need support or information about quitting drugs.  Tell your health care provider if you often feel depressed.  Tell your health care provider if you have ever been abused or do not feel safe at home. This information is not intended to replace advice given to you by your health care provider. Make sure you discuss any questions you have with your health care provider. Document Released: 11/07/2007 Document Revised: 01/08/2016 Document Reviewed: 02/12/2015 Elsevier Interactive Patient Education  [employer].

## 2017-03-18 ENCOUNTER — Other Ambulatory Visit: Payer: Self-pay | Admitting: Internal Medicine

## 2017-03-18 MED ORDER — WARFARIN SODIUM 3 MG PO TABS: 3.0000 mg | ORAL_TABLET | Freq: Every day | ORAL | 2 refills | Status: DC

## 2017-03-20 NOTE — Assessment & Plan Note (Signed)
His INR is 4.6 on 27 mg weekly (6 mg x 2,  3 mg x 5) .  Will reduce dose to 3 mg daily and repeat INR in 2 weeks   Lab Results  Component Value Date   INR 3.2 (H) 03/17/2017   INR 1.7 (H) 03/11/2017   INR 2.2 (H) 03/04/2017

## 2017-03-20 NOTE — Assessment & Plan Note (Addendum)
Fall risk, alcohol history and cognitive function assessed.  He does not take NSAIDs. He is safe to continue use of warfarin.  Lab Results  Component Value Date   WBC 8.1 10/16/2016   HGB 12.5 (L) 10/16/2016   HCT 37.1 (L) 10/16/2016   MCV 88.4 10/16/2016   PLT 264.0 10/16/2016

## 2017-03-20 NOTE — Assessment & Plan Note (Addendum)
Annual comprehensive preventive exam was done as well as an evaluation and management of acute and chronic conditions .  During the course of the visit the patient was educated and counseled about appropriate screening and preventive services including :  diabetes screening, lipid analysis with projected  10 year  risk for CAD , nutrition counseling, prostate and colorectal cancer screening, and recommended immunizations.  Printed recommendations for health maintenance screenings was given. He is at average risk for colon CA ;Cologuard ordered.   Lab Results  Component Value Date   PSA 1.19 03/17/2017   PSA 1.43 03/11/2016   PSA 1.15 03/11/2015

## 2017-03-22 ENCOUNTER — Telehealth: Payer: Self-pay

## 2017-03-22 DIAGNOSIS — M25562 Pain in left knee: Secondary | ICD-10-CM | POA: Insufficient documentation

## 2017-03-22 DIAGNOSIS — M1712 Unilateral primary osteoarthritis, left knee: Secondary | ICD-10-CM | POA: Diagnosis not present

## 2017-03-22 NOTE — Telephone Encounter (Signed)
Patient had called office this am, he has leg knee pain, that is being going on for the past few days.  Was advised on the phone to go to the urgent care, refused and stopped in the office.  Patient is walking unassisted with a limp.  Asked to show his knee, did basic range of motion on his own and knee is sore to the right side.  States it did this approximately a month ago and used icy hot/biofreeze and hot and cold packs on it and within a few days was better.  Woke up on Saturday and could barely move it.  Used his cane and made it to church but it is worse this time.  He has not taken any OTC oral medications.  Knee again is not swollen or red, no visible issues, patient doesn't remember injuring it.  I explained that you don't have any appts today or in the next day or so.    Explained that we could do a referral to ortho and that there is a place in town called [facility] and he is aware of them and would like to go there.  Please advise.

## 2017-03-22 NOTE — Addendum Note (Signed)
Addended by: Sherlene ShamsULLO, Karlei Waldo L on: 03/22/2017 01:29 PM   Modules accepted: Orders

## 2017-03-22 NOTE — Telephone Encounter (Signed)
He can use up to 2000 mg of tylenol (acetominophen) daily for his knee pain , but cannot use motrin bc he takes coumadin.  Referral made to [facility]

## 2017-03-22 NOTE — Telephone Encounter (Signed)
Patient was willing to go asap.   Thanks

## 2017-03-26 ENCOUNTER — Other Ambulatory Visit: Payer: Self-pay | Admitting: Internal Medicine

## 2017-03-26 DIAGNOSIS — M1712 Unilateral primary osteoarthritis, left knee: Secondary | ICD-10-CM | POA: Diagnosis not present

## 2017-03-30 NOTE — Progress Notes (Signed)
cologuard has been faxed.

## 2017-04-01 ENCOUNTER — Other Ambulatory Visit: Payer: Self-pay | Admitting: Radiology

## 2017-04-01 DIAGNOSIS — Z5181 Encounter for therapeutic drug level monitoring: Secondary | ICD-10-CM

## 2017-04-01 DIAGNOSIS — Z7901 Long term (current) use of anticoagulants: Principal | ICD-10-CM

## 2017-04-02 ENCOUNTER — Other Ambulatory Visit (INDEPENDENT_AMBULATORY_CARE_PROVIDER_SITE_OTHER): Payer: Medicare Other

## 2017-04-02 DIAGNOSIS — Z5181 Encounter for therapeutic drug level monitoring: Secondary | ICD-10-CM | POA: Diagnosis not present

## 2017-04-02 DIAGNOSIS — Z7901 Long term (current) use of anticoagulants: Secondary | ICD-10-CM | POA: Diagnosis not present

## 2017-04-02 LAB — PROTIME-INR
INR: 2.9 ratio — ABNORMAL HIGH (ref 0.8–1.0)
Prothrombin Time: 31.4 s — ABNORMAL HIGH (ref 9.6–13.1)

## 2017-04-02 NOTE — Addendum Note (Signed)
Addended by: Penne LashWIGGINS, Ameris Akamine N on: 04/02/2017 08:03 AM   Modules accepted: Orders

## 2017-06-08 ENCOUNTER — Other Ambulatory Visit: Payer: Self-pay

## 2017-06-08 MED ORDER — HYDROCHLOROTHIAZIDE 25 MG PO TABS: ORAL_TABLET | ORAL | 1 refills | Status: DC

## 2017-06-18 ENCOUNTER — Ambulatory Visit: Payer: Medicare Other | Admitting: Internal Medicine

## 2017-06-21 ENCOUNTER — Ambulatory Visit: Payer: Medicare Other | Admitting: Internal Medicine

## 2017-06-28 ENCOUNTER — Other Ambulatory Visit: Payer: Self-pay | Admitting: Internal Medicine

## 2017-07-02 ENCOUNTER — Ambulatory Visit: Payer: Medicare Other | Admitting: Internal Medicine

## 2017-07-02 ENCOUNTER — Telehealth: Payer: Self-pay | Admitting: Radiology

## 2017-07-02 ENCOUNTER — Encounter: Payer: Self-pay | Admitting: Internal Medicine

## 2017-07-02 VITALS — BP 112/66 | HR 91 | Temp 97.6°F | Resp 14 | Ht 69.0 in | Wt 205.8 lb

## 2017-07-02 DIAGNOSIS — E1121 Type 2 diabetes mellitus with diabetic nephropathy: Secondary | ICD-10-CM

## 2017-07-02 DIAGNOSIS — E1129 Type 2 diabetes mellitus with other diabetic kidney complication: Secondary | ICD-10-CM | POA: Insufficient documentation

## 2017-07-02 DIAGNOSIS — Z7901 Long term (current) use of anticoagulants: Secondary | ICD-10-CM | POA: Diagnosis not present

## 2017-07-02 DIAGNOSIS — E119 Type 2 diabetes mellitus without complications: Secondary | ICD-10-CM | POA: Diagnosis not present

## 2017-07-02 DIAGNOSIS — R809 Proteinuria, unspecified: Secondary | ICD-10-CM

## 2017-07-02 DIAGNOSIS — I10 Essential (primary) hypertension: Secondary | ICD-10-CM | POA: Diagnosis not present

## 2017-07-02 DIAGNOSIS — E782 Mixed hyperlipidemia: Secondary | ICD-10-CM | POA: Diagnosis not present

## 2017-07-02 LAB — COMPREHENSIVE METABOLIC PANEL
ALT: 16 U/L (ref 0–53)
AST: 16 U/L (ref 0–37)
Albumin: 4.2 g/dL (ref 3.5–5.2)
Alkaline Phosphatase: 40 U/L (ref 39–117)
BUN: 28 mg/dL — ABNORMAL HIGH (ref 6–23)
CO2: 29 mEq/L (ref 19–32)
Calcium: 10.1 mg/dL (ref 8.4–10.5)
Chloride: 105 mEq/L (ref 96–112)
Creatinine, Ser: 1.46 mg/dL (ref 0.40–1.50)
GFR: 49.9 mL/min — ABNORMAL LOW (ref >60.00)
Glucose, Bld: 116 mg/dL — ABNORMAL HIGH (ref 70–99)
Potassium: 4.1 mEq/L (ref 3.5–5.1)
Sodium: 138 mEq/L (ref 135–145)
Total Bilirubin: 0.6 mg/dL (ref 0.2–1.2)
Total Protein: 7 g/dL (ref 6.0–8.3)

## 2017-07-02 LAB — MICROALBUMIN / CREATININE URINE RATIO
Creatinine,U: 150.8 mg/dL
Microalb Creat Ratio: 5.5 mg/g (ref 0.0–30.0)
Microalb, Ur: 8.3 mg/dL — ABNORMAL HIGH (ref 0.0–1.9)

## 2017-07-02 LAB — LIPID PANEL
Cholesterol: 96 mg/dL (ref 0–200)
HDL: 28.5 mg/dL — ABNORMAL LOW (ref >39.00)
NonHDL: 67.81
Total CHOL/HDL Ratio: 3
Triglycerides: 248 mg/dL — ABNORMAL HIGH (ref 0.0–149.0)
VLDL: 49.6 mg/dL — ABNORMAL HIGH (ref 0.0–40.0)

## 2017-07-02 LAB — PROTIME-INR
INR: 5.6 ratio (ref 0.8–1.0)
Prothrombin Time: 59.6 s (ref 9.6–13.1)

## 2017-07-02 LAB — LDL CHOLESTEROL, DIRECT: Direct LDL: 39 mg/dL

## 2017-07-02 NOTE — Patient Instructions (Addendum)
You are doing well !  I have given you a One touch verio glucometer to keep at home   Don't let your blood sugar drop by fasting too long .[name]. You do not need to check your sugars unless you feel bad (shaky,  Confused  Hot)    Avoid Aleve as much as possible and use tylenol instead .  This is safer to use with coumadin than aleve  Don't forget to get your eyes examined once a year   See you in 3 months

## 2017-07-02 NOTE — Progress Notes (Signed)
Subjective:  Patient ID: Stephen ChannelLevi [name] Kelly, male    DOB: 05/26/1940  Age: 77 y.o. MRN: [id number]  CC: The primary encounter diagnosis was Mixed hyperlipidemia. Diagnoses of Anticoagulation monitoring, INR range 2-3, Essential hypertension, Type 2 diabetes mellitus without complication, without long-term current use of insulin (HCC), Diabetic nephropathy associated with type 2 diabetes mellitus (HCC), and Long term current use of anticoagulant therapy were also pertinent to this visit.  HPI Stephen ChannelLevi [name] Kelly presents for follow up on type 2 diabetes,  hyperlipidemia, hypertension,  And chronic warfarin use.    Patient has no complaints today.  Patient is following a low glycemic index diet and taking all prescribed medications regularly without side effects.  He does not check his blood sugars and does not own a glucometer.  Patient has had an eye exam in the last 12 months and checks feet regularly for signs of infection.  Patient does not walk barefoot outside,  And denies any numbness tingling or burning in feet. Patient is up to date on all recommended vaccinations.  Remains physically active, spends most day splitting wood for several hours daily,  doing Yard work .  Diet full of vegetables.    Overdue Due for INR   Last meal was a peanut butter sandwhich 8:30 am    Lab Results  Component Value Date   HGBA1C 7.3 (H) 07/02/2017     Outpatient Medications Prior to Visit  Medication Sig Dispense Refill  . allopurinol (ZYLOPRIM) 300 MG tablet Take 1 tablet (300 mg total) by mouth daily. 90 tablet 2  . atorvastatin (LIPITOR) 20 MG tablet TAKE ONE TABLET BY MOUTH EVERY DAY 90 tablet 1  . fluocinonide-emollient (LIDEX-E) 0.05 % cream Apply 1 application topically 2 (two) times daily. 60 g 2  . guaiFENesin-codeine (CHERATUSSIN AC) 100-10 MG/5ML syrup Take 5 mLs by mouth 3 (three) times daily as needed for cough. 180 mL 0  . hydrochlorothiazide (HYDRODIURIL) 25 MG tablet TAKE ONE (1) TABLET  BY MOUTH EVERY DAY 90 tablet 1  . losartan (COZAAR) 100 MG tablet TAKE ONE TABLET BY MOUTH EVERY DAY 90 tablet 3  . metFORMIN (GLUCOPHAGE) 850 MG tablet TAKE ONE TABLET BY MOUTH TWICE DAILY WITH FOOD 60 tablet 3  . warfarin (COUMADIN) 3 MG tablet Take 1 tablet (3 mg total) by mouth daily. On Fridays and Sunday,s  4 mg all other day s 30 tablet 2  . warfarin (COUMADIN) 4 MG tablet take 1 tablet by mouth once daily 90 tablet 0  . naproxen sodium (ANAPROX) 220 MG tablet Take 500 mg by mouth 2 (two) times daily with a meal.      No facility-administered medications prior to visit.     Review of Systems;  Patient denies headache, fevers, malaise, unintentional weight loss, skin rash, eye pain, sinus congestion and sinus pain, sore throat, dysphagia,  hemoptysis , cough, dyspnea, wheezing, chest pain, palpitations, orthopnea, edema, abdominal pain, nausea, melena, diarrhea, constipation, flank pain, dysuria, hematuria, urinary  Frequency, nocturia, numbness, tingling, seizures,  Focal weakness, Loss of consciousness,  Tremor, insomnia, depression, anxiety, and suicidal ideation.      Objective:  BP 112/66 (BP Location: Left Arm, Patient Position: Sitting, Cuff Size: Normal)   Pulse 91   Temp 97.6 F (36.4 C) (Oral)   Resp 14   Ht 5\' 9"  (1.753 m)   Wt 205 lb 12.8 oz (93.4 kg)   SpO2 95%   BMI 30.39 kg/m   BP Readings from Last 3 Encounters:  07/02/17 112/66  03/17/17 134/72  01/27/17 128/76    Wt Readings from Last 3 Encounters:  07/02/17 205 lb 12.8 oz (93.4 kg)  03/17/17 198 lb (89.8 kg)  01/27/17 198 lb (89.8 kg)    General appearance: alert, cooperative and appears stated age Ears: normal TM's and external ear canals both ears Throat: lips, mucosa, and tongue normal; teeth and gums normal Neck: no adenopathy, no carotid bruit, supple, symmetrical, trachea midline and thyroid not enlarged, symmetric, no tenderness/mass/nodules Back: symmetric, no curvature. ROM normal. No CVA  tenderness. Lungs: clear to auscultation bilaterally Heart: regular rate and rhythm, S1, S2 normal, no murmur, click, rub or gallop Abdomen: soft, non-tender; bowel sounds normal; no masses,  no organomegaly Pulses: 2+ and symmetric Skin: Skin color, texture, turgor normal. No rashes or lesions Lymph nodes: Cervical, supraclavicular, and axillary nodes normal.  Lab Results  Component Value Date   HGBA1C 7.3 (H) 07/02/2017   HGBA1C 7.1 (H) 01/27/2017   HGBA1C 7.3 (H) 10/16/2016    Lab Results  Component Value Date   CREATININE 1.46 07/02/2017   CREATININE 1.31 01/27/2017   CREATININE 1.45 10/16/2016    Lab Results  Component Value Date   WBC 8.1 10/16/2016   HGB 12.5 (L) 10/16/2016   HCT 37.1 (L) 10/16/2016   PLT 264.0 10/16/2016   GLUCOSE 116 (H) 07/02/2017   CHOL 96 07/02/2017   TRIG 248.0 (H) 07/02/2017   HDL 28.50 (L) 07/02/2017   LDLDIRECT 39.0 07/02/2017   LDLCALC 17 03/17/2017   ALT 16 07/02/2017   AST 16 07/02/2017   NA 138 07/02/2017   K 4.1 07/02/2017   CL 105 07/02/2017   CREATININE 1.46 07/02/2017   BUN 28 (H) 07/02/2017   CO2 29 07/02/2017   TSH 2.10 03/11/2016   PSA 1.19 03/17/2017   INR 5.6 (HH) 07/02/2017   HGBA1C 7.3 (H) 07/02/2017   MICROALBUR 8.3 (H) 07/02/2017     Assessment & Plan:   Problem List Items Addressed This Visit    Hypertension    Well controlled on current regimen. Renal function stable, no changes today.  Lab Results  Component Value Date   CREATININE 1.46 07/02/2017   Lab Results  Component Value Date   NA 138 07/02/2017   K 4.1 07/02/2017   CL 105 07/02/2017   CO2 29 07/02/2017         Relevant Orders   Lipid panel (Completed)   Nephropathy, diabetic (HCC)    . Continue losartan for nephropathy .  No changes today    Lab Results  Component Value Date   HGBA1C 7.3 (H) 07/02/2017   Lab Results  Component Value Date   MICROALBUR 8.3 (H) 07/02/2017    ,       Long term current use of anticoagulant  therapy    INR is very high.  Asked to suspend coumadin and repeat INR on Monday  Lab Results  Component Value Date   INR 5.6 (HH) 07/02/2017   INR 2.9 (H) 04/02/2017   INR 3.2 (H) 03/17/2017         Hyperlipidemia - Primary   Anticoagulation monitoring, INR range 2-3   Relevant Orders   Protime-INR (Completed)   Type 2 diabetes mellitus without complications (HCC)     Historically well-controlled on metformin 850 mg bid.   hemoglobin A1c has risen slightly and is now 7.3 .will  Add Januvia 25 mg daily .  Patient is up-to-date on eye exams and foot exam is normal  today. Patient has no microalbuminuria. Patient is tolerating statin therapy for CAD risk reduction and on ACE/ARB for  microalbuminuria and  hypertension  Lab Results  Component Value Date   HGBA1C 7.3 (H) 07/02/2017         Relevant Orders   Microalbumin / creatinine urine ratio (Completed)   Comprehensive metabolic panel (Completed)   Hemoglobin A1C (Completed)    A total of 25 minutes of face to face time was spent with patient more than half of which was spent in counselling about the above mentioned conditions  and coordination of care   I have discontinued Pamelia Hoit B. Barton "[name]"'s naproxen sodium. I am also having him maintain his fluocinonide-emollient, guaiFENesin-codeine, allopurinol, metFORMIN, losartan, warfarin, atorvastatin, hydrochlorothiazide, and warfarin.  No orders of the defined types were placed in this encounter.   Medications Discontinued During This Encounter  Medication Reason  . naproxen sodium (ANAPROX) 220 MG tablet     Follow-up: Return in about 3 months (around 09/29/2017) for follow up diabetes.   Sherlene Shams, MD

## 2017-07-02 NOTE — Telephone Encounter (Signed)
Patient notified and lab scheduled.

## 2017-07-02 NOTE — Telephone Encounter (Signed)
[facility] called with critical results for pt INR  INR-5.6 PT- 59.6

## 2017-07-02 NOTE — Telephone Encounter (Signed)
INR is very high.  Please tell patient to stop coumadin.  Return to clinic on Monday for INR

## 2017-07-03 ENCOUNTER — Other Ambulatory Visit: Payer: Self-pay | Admitting: Internal Medicine

## 2017-07-03 LAB — HEMOGLOBIN A1C
Hgb A1c MFr Bld: 7.3 % of total Hgb — ABNORMAL HIGH (ref <5.7)
Mean Plasma Glucose: 163 (calc)
eAG (mmol/L): 9 (calc)

## 2017-07-03 MED ORDER — SITAGLIPTIN PHOSPHATE 25 MG PO TABS: 25.0000 mg | ORAL_TABLET | Freq: Every day | ORAL | 0 refills | Status: DC

## 2017-07-04 NOTE — Assessment & Plan Note (Signed)
Well controlled on current regimen. Renal function stable, no changes today.  Lab Results  Component Value Date   CREATININE 1.46 07/02/2017   Lab Results  Component Value Date   NA 138 07/02/2017   K 4.1 07/02/2017   CL 105 07/02/2017   CO2 29 07/02/2017

## 2017-07-04 NOTE — Assessment & Plan Note (Signed)
INR is very high.  Asked to suspend coumadin and repeat INR on Monday  Lab Results  Component Value Date   INR 5.6 (HH) 07/02/2017   INR 2.9 (H) 04/02/2017   INR 3.2 (H) 03/17/2017

## 2017-07-04 NOTE — Assessment & Plan Note (Signed)
.   Continue losartan for nephropathy .  No changes today    Lab Results  Component Value Date   HGBA1C 7.3 (H) 07/02/2017   Lab Results  Component Value Date   MICROALBUR 8.3 (H) 07/02/2017    ,

## 2017-07-04 NOTE — Assessment & Plan Note (Addendum)
Historically well-controlled on metformin 850 mg bid.   hemoglobin A1c has risen slightly and is now 7.3 .will  Add Januvia 25 mg daily .  Patient is up-to-date on eye exams and foot exam is normal today. Patient has no microalbuminuria. Patient is tolerating statin therapy for CAD risk reduction and on ACE/ARB for  microalbuminuria and  hypertension  Lab Results  Component Value Date   HGBA1C 7.3 (H) 07/02/2017

## 2017-07-05 ENCOUNTER — Telehealth: Payer: Self-pay | Admitting: Radiology

## 2017-07-05 ENCOUNTER — Other Ambulatory Visit: Payer: Self-pay | Admitting: Radiology

## 2017-07-05 ENCOUNTER — Other Ambulatory Visit (INDEPENDENT_AMBULATORY_CARE_PROVIDER_SITE_OTHER): Payer: Medicare Other

## 2017-07-05 ENCOUNTER — Other Ambulatory Visit: Payer: Self-pay | Admitting: Family Medicine

## 2017-07-05 ENCOUNTER — Other Ambulatory Visit: Payer: Self-pay | Admitting: Internal Medicine

## 2017-07-05 DIAGNOSIS — Z5181 Encounter for therapeutic drug level monitoring: Secondary | ICD-10-CM | POA: Diagnosis not present

## 2017-07-05 DIAGNOSIS — Z7901 Long term (current) use of anticoagulants: Secondary | ICD-10-CM | POA: Diagnosis not present

## 2017-07-05 LAB — PROTIME-INR
INR: 6.1 ratio (ref 0.8–1.0)
Prothrombin Time: 65.1 s (ref 9.6–13.1)

## 2017-07-05 NOTE — Telephone Encounter (Signed)
[facility] called with pt critical INR of 6.1 and PT of 65.1

## 2017-07-05 NOTE — Telephone Encounter (Signed)
Patient aware to hold coumadin and have INR rechecked per Dr. Darrick Huntsmanullo. Pt has been scheduled for Thursday.

## 2017-07-05 NOTE — Telephone Encounter (Signed)
His coumadin level is even higher than it was on Friday.  Please find out if he stopped his coumadin on Friday as directed,  And whether he has been having any bruising or bleeding

## 2017-07-05 NOTE — Progress Notes (Signed)
Continue to suspend coumadin,  Recheck on Thursday

## 2017-07-06 ENCOUNTER — Other Ambulatory Visit: Payer: Self-pay | Admitting: Internal Medicine

## 2017-07-06 NOTE — Telephone Encounter (Signed)
Copied from CRM [id number]. Topic: Quick Communication - See Telephone Encounter >> Jul 06, 2017  3:44 PM Joana ReamerWarren, Amber N wrote: CRM for notification. See Telephone encounter for: 07/06/17. Patient at pharmacy and Metformin hasn't been called in and he is completely out of that medication. Also, he went to pick up the Januvia that was prescribed earlier today and it has an $111 copay on that medication. He wants to know if there is a cheaper alternative to that. He uses the [facility] on [address] in [place].

## 2017-07-07 MED ORDER — METFORMIN HCL 850 MG PO TABS: 850.0000 mg | ORAL_TABLET | Freq: Two times a day (BID) | ORAL | 3 refills | Status: DC

## 2017-07-07 MED ORDER — GLIPIZIDE ER 2.5 MG PO TB24: 2.5000 mg | ORAL_TABLET | Freq: Every day | ORAL | 1 refills | Status: DC

## 2017-07-07 NOTE — Telephone Encounter (Signed)
Pt. States he "just missed a call from the office." Informed him that metformin and glipizide were sent to his pharmacy. Verbalizes understanding.

## 2017-07-07 NOTE — Telephone Encounter (Signed)
The refill for metformin sent to pharmacy please advise patient says Januvia co -pay $111.00 is there another medication he can try?

## 2017-07-07 NOTE — Telephone Encounter (Signed)
The metformibn was sent on feb 13.  Please call in if not received.  Adding glipizide XL instead of [place]

## 2017-07-07 NOTE — Telephone Encounter (Signed)
Pt's last A1C 07/02/17 at 7.3   Metformin hasn't been called in and he is completely out of that medication. Also, he went to pick up the Januvia that was prescribed yesterday and there is a $111 copay. Pt asking if there is a cheaper alternative to that. Please advise.   [facility] on [address] in [place].

## 2017-07-08 ENCOUNTER — Other Ambulatory Visit (INDEPENDENT_AMBULATORY_CARE_PROVIDER_SITE_OTHER): Payer: Medicare Other

## 2017-07-08 DIAGNOSIS — Z5181 Encounter for therapeutic drug level monitoring: Secondary | ICD-10-CM | POA: Diagnosis not present

## 2017-07-08 DIAGNOSIS — Z7901 Long term (current) use of anticoagulants: Secondary | ICD-10-CM | POA: Diagnosis not present

## 2017-07-08 LAB — PROTIME-INR
INR: 1.8 ratio — ABNORMAL HIGH (ref 0.8–1.0)
Prothrombin Time: 19.2 s — ABNORMAL HIGH (ref 9.6–13.1)

## 2017-07-13 ENCOUNTER — Other Ambulatory Visit: Payer: Self-pay | Admitting: Radiology

## 2017-07-13 DIAGNOSIS — Z7901 Long term (current) use of anticoagulants: Principal | ICD-10-CM

## 2017-07-13 DIAGNOSIS — Z5181 Encounter for therapeutic drug level monitoring: Secondary | ICD-10-CM

## 2017-07-14 ENCOUNTER — Other Ambulatory Visit (INDEPENDENT_AMBULATORY_CARE_PROVIDER_SITE_OTHER): Payer: Medicare Other

## 2017-07-14 DIAGNOSIS — Z7901 Long term (current) use of anticoagulants: Secondary | ICD-10-CM | POA: Diagnosis not present

## 2017-07-14 DIAGNOSIS — Z5181 Encounter for therapeutic drug level monitoring: Secondary | ICD-10-CM

## 2017-07-14 LAB — PROTIME-INR
INR: 1.6 ratio — ABNORMAL HIGH (ref 0.8–1.0)
Prothrombin Time: 17.7 s — ABNORMAL HIGH (ref 9.6–13.1)

## 2017-07-29 ENCOUNTER — Other Ambulatory Visit: Payer: Self-pay | Admitting: Radiology

## 2017-07-29 DIAGNOSIS — Z5181 Encounter for therapeutic drug level monitoring: Secondary | ICD-10-CM

## 2017-07-29 DIAGNOSIS — Z7901 Long term (current) use of anticoagulants: Principal | ICD-10-CM

## 2017-07-30 ENCOUNTER — Other Ambulatory Visit (INDEPENDENT_AMBULATORY_CARE_PROVIDER_SITE_OTHER): Payer: Medicare Other

## 2017-07-30 ENCOUNTER — Other Ambulatory Visit: Payer: Self-pay | Admitting: Internal Medicine

## 2017-07-30 DIAGNOSIS — Z7901 Long term (current) use of anticoagulants: Secondary | ICD-10-CM | POA: Diagnosis not present

## 2017-07-30 DIAGNOSIS — Z5181 Encounter for therapeutic drug level monitoring: Secondary | ICD-10-CM

## 2017-07-30 LAB — PROTIME-INR
INR: 4.2 ratio — ABNORMAL HIGH (ref 0.8–1.0)
Prothrombin Time: 44.8 s — ABNORMAL HIGH (ref 9.6–13.1)

## 2017-07-30 MED ORDER — WARFARIN SODIUM 3 MG PO TABS: 3.0000 mg | ORAL_TABLET | Freq: Every day | ORAL | 2 refills | Status: DC

## 2017-08-02 ENCOUNTER — Telehealth: Payer: Self-pay | Admitting: Internal Medicine

## 2017-08-02 NOTE — Telephone Encounter (Signed)
Copied from CRM [id number]. Topic: Quick Communication - See Telephone Encounter >> Aug 02, 2017  8:41 AM Arlyss Gandyichardson, Amali Uhls N, NT wrote: CRM for notification. See Telephone encounter for: Pt would like his lab results and instructions of how many Warfarin pills he is suppose to be taking.    08/02/17.

## 2017-08-02 NOTE — Telephone Encounter (Signed)
atient notified of results and to hold coumadin today and start 3 mg daily tomorrow and to repeat INR in two weeks ,labscheduled.

## 2017-08-12 ENCOUNTER — Other Ambulatory Visit: Payer: Self-pay | Admitting: Radiology

## 2017-08-12 DIAGNOSIS — Z7901 Long term (current) use of anticoagulants: Principal | ICD-10-CM

## 2017-08-12 DIAGNOSIS — Z5181 Encounter for therapeutic drug level monitoring: Secondary | ICD-10-CM

## 2017-08-16 ENCOUNTER — Other Ambulatory Visit (INDEPENDENT_AMBULATORY_CARE_PROVIDER_SITE_OTHER): Payer: Medicare Other

## 2017-08-16 DIAGNOSIS — Z5181 Encounter for therapeutic drug level monitoring: Secondary | ICD-10-CM | POA: Diagnosis not present

## 2017-08-16 DIAGNOSIS — Z7901 Long term (current) use of anticoagulants: Secondary | ICD-10-CM

## 2017-08-16 LAB — PROTIME-INR
INR: 2.6 ratio — ABNORMAL HIGH (ref 0.8–1.0)
Prothrombin Time: 28.1 s — ABNORMAL HIGH (ref 9.6–13.1)

## 2017-08-19 ENCOUNTER — Telehealth: Payer: Self-pay

## 2017-08-19 ENCOUNTER — Telehealth: Payer: Self-pay | Admitting: Internal Medicine

## 2017-08-19 DIAGNOSIS — D689 Coagulation defect, unspecified: Secondary | ICD-10-CM

## 2017-08-19 NOTE — Telephone Encounter (Signed)
Copied from CRM [phone]. Topic: Quick Communication - See Telephone Encounter >> Aug 19, 2017 10:31 AM Diana EvesHoyt, Maryann B wrote: CRM for notification. See Telephone encounter for: 08/19/17. Pt calling about labs done on 08/16/17.

## 2017-08-19 NOTE — Telephone Encounter (Signed)
Lab has been ordered for next INR draw.

## 2017-08-19 NOTE — Telephone Encounter (Signed)
Please see result note message.  

## 2017-09-13 ENCOUNTER — Other Ambulatory Visit (INDEPENDENT_AMBULATORY_CARE_PROVIDER_SITE_OTHER): Payer: Medicare Other

## 2017-09-13 ENCOUNTER — Other Ambulatory Visit: Payer: Self-pay | Admitting: Radiology

## 2017-09-13 DIAGNOSIS — Z7901 Long term (current) use of anticoagulants: Secondary | ICD-10-CM | POA: Diagnosis not present

## 2017-09-13 DIAGNOSIS — Z5181 Encounter for therapeutic drug level monitoring: Secondary | ICD-10-CM | POA: Diagnosis not present

## 2017-09-13 LAB — PROTIME-INR
INR: 3 ratio — ABNORMAL HIGH (ref 0.8–1.0)
Prothrombin Time: 34 s — ABNORMAL HIGH (ref 9.6–13.1)

## 2017-09-17 ENCOUNTER — Other Ambulatory Visit: Payer: Self-pay | Admitting: Podiatry

## 2017-09-17 ENCOUNTER — Ambulatory Visit (INDEPENDENT_AMBULATORY_CARE_PROVIDER_SITE_OTHER): Payer: Medicare Other

## 2017-09-17 ENCOUNTER — Ambulatory Visit: Payer: Medicare Other | Admitting: Podiatry

## 2017-09-17 ENCOUNTER — Encounter: Payer: Self-pay | Admitting: Podiatry

## 2017-09-17 ENCOUNTER — Ambulatory Visit: Payer: Medicare Other

## 2017-09-17 DIAGNOSIS — M7672 Peroneal tendinitis, left leg: Secondary | ICD-10-CM

## 2017-09-17 DIAGNOSIS — Q828 Other specified congenital malformations of skin: Secondary | ICD-10-CM | POA: Diagnosis not present

## 2017-09-17 DIAGNOSIS — M779 Enthesopathy, unspecified: Secondary | ICD-10-CM

## 2017-09-17 DIAGNOSIS — R52 Pain, unspecified: Secondary | ICD-10-CM

## 2017-09-17 MED ORDER — MELOXICAM 15 MG PO TABS: 15.0000 mg | ORAL_TABLET | Freq: Every day | ORAL | 1 refills | Status: DC

## 2017-09-20 NOTE — Progress Notes (Signed)
   Subjective: 77 year old male presenting today as a new patient with a chief compliant of a painful callus lesion noted to the right plantar forefoot that appeared about one month ago. He also reports a nodule to the nodule to the lateral side of the left foot. He has not done anything to treat his symptoms. Ambulation and weightbearing increases the pain. Patient is here for further evaluation and treatment.   Past Medical History:  Diagnosis Date  . Clotting disorder (HCC) 2011   Lupus Inhibitor positive by March 2012 studies  . Gout, arthritis    managed with allopurinol  . History of renal calculi 2001   history of lithotripsy ,  no stent  . Hyperlipidemia   . Hypertension   . Lupus anticoagulant inhibitor syndrome [facility])    per gitten's test March 2012  . Patent foramen ovale   . Pulmonary nodule, right 2011   unchanged, followed by gitten with serial ct     Objective:  Physical Exam General: Alert and oriented x3 in no acute distress  Dermatology: Hyperkeratotic lesion present on the right foot. Pain on palpation with a central nucleated core noted. Skin is warm, dry and supple bilateral lower extremities. Negative for open lesions or macerations.  Vascular: Palpable pedal pulses bilaterally. No edema or erythema noted. Capillary refill within normal limits.  Neurological: Epicritic and protective threshold grossly intact bilaterally.   Musculoskeletal Exam: Pain on palpation at the keratotic lesion noted as well as to the peroneal tendon of the left foot. Range of motion within normal limits bilateral. Muscle strength 5/5 in all groups bilateral.  Assessment: 1. Peroneal tendinitis left 2. Porokeratosis right    Plan of Care:  1. Patient evaluated 2. Excisional debridement of keratoic lesion using a chisel blade was performed without incident.  3. Dressed area with light dressing. 4. Injection of 0.5 mLs Celestone Soluspan injected into the peroneal tendon sheath  of the left foot.  5. Short CAM boot dispensed. Weightbearing as tolerated.  6. Prescription for Meloxicam provided to patient.  7. Return to clinic in 4 weeks.    Felecia Shelling, DPM [facility]  Dr. Felecia Shelling, DPM    [address]                                        [place], [place] [address]                Office [phone]  Fax [phone]

## 2017-09-28 ENCOUNTER — Other Ambulatory Visit: Payer: Self-pay | Admitting: Internal Medicine

## 2017-09-29 ENCOUNTER — Ambulatory Visit: Payer: Medicare Other | Admitting: Internal Medicine

## 2017-10-01 ENCOUNTER — Ambulatory Visit: Payer: Medicare Other | Admitting: Internal Medicine

## 2017-10-01 ENCOUNTER — Encounter: Payer: Self-pay | Admitting: Internal Medicine

## 2017-10-01 VITALS — BP 122/74 | HR 82 | Temp 97.7°F | Resp 14 | Ht 69.0 in | Wt 202.8 lb

## 2017-10-01 DIAGNOSIS — E1129 Type 2 diabetes mellitus with other diabetic kidney complication: Secondary | ICD-10-CM | POA: Diagnosis not present

## 2017-10-01 DIAGNOSIS — E1169 Type 2 diabetes mellitus with other specified complication: Secondary | ICD-10-CM | POA: Diagnosis not present

## 2017-10-01 DIAGNOSIS — E785 Hyperlipidemia, unspecified: Secondary | ICD-10-CM | POA: Diagnosis not present

## 2017-10-01 DIAGNOSIS — Z7901 Long term (current) use of anticoagulants: Secondary | ICD-10-CM | POA: Diagnosis not present

## 2017-10-01 DIAGNOSIS — E119 Type 2 diabetes mellitus without complications: Secondary | ICD-10-CM

## 2017-10-01 DIAGNOSIS — I10 Essential (primary) hypertension: Secondary | ICD-10-CM

## 2017-10-01 DIAGNOSIS — R809 Proteinuria, unspecified: Secondary | ICD-10-CM

## 2017-10-01 LAB — CBC WITH DIFFERENTIAL/PLATELET
Basophils Absolute: 0.1 10*3/uL (ref 0.0–0.1)
Basophils Relative: 0.7 % (ref 0.0–3.0)
Eosinophils Absolute: 0.4 10*3/uL (ref 0.0–0.7)
Eosinophils Relative: 4.4 % (ref 0.0–5.0)
HCT: 40.3 % (ref 39.0–52.0)
Hemoglobin: 13.6 g/dL (ref 13.0–17.0)
Lymphocytes Relative: 23.1 % (ref 12.0–46.0)
Lymphs Abs: 1.9 10*3/uL (ref 0.7–4.0)
MCHC: 33.7 g/dL (ref 30.0–36.0)
MCV: 90.2 fl (ref 78.0–100.0)
Monocytes Absolute: 0.5 10*3/uL (ref 0.1–1.0)
Monocytes Relative: 5.9 % (ref 3.0–12.0)
Neutro Abs: 5.4 10*3/uL (ref 1.4–7.7)
Neutrophils Relative %: 65.9 % (ref 43.0–77.0)
Platelets: 253 10*3/uL (ref 150.0–400.0)
RBC: 4.46 Mil/uL (ref 4.22–5.81)
RDW: 15.3 % (ref 11.5–15.5)
WBC: 8.2 10*3/uL (ref 4.0–10.5)

## 2017-10-01 LAB — COMPREHENSIVE METABOLIC PANEL
ALT: 16 U/L (ref 0–53)
AST: 15 U/L (ref 0–37)
Albumin: 4 g/dL (ref 3.5–5.2)
Alkaline Phosphatase: 39 U/L (ref 39–117)
BUN: 24 mg/dL — ABNORMAL HIGH (ref 6–23)
CO2: 28 mEq/L (ref 19–32)
Calcium: 9.5 mg/dL (ref 8.4–10.5)
Chloride: 103 mEq/L (ref 96–112)
Creatinine, Ser: 1.48 mg/dL (ref 0.40–1.50)
GFR: 49.09 mL/min — ABNORMAL LOW (ref >60.00)
Glucose, Bld: 152 mg/dL — ABNORMAL HIGH (ref 70–99)
Potassium: 4.3 mEq/L (ref 3.5–5.1)
Sodium: 137 mEq/L (ref 135–145)
Total Bilirubin: 0.5 mg/dL (ref 0.2–1.2)
Total Protein: 6.9 g/dL (ref 6.0–8.3)

## 2017-10-01 LAB — HEMOGLOBIN A1C: Hgb A1c MFr Bld: 7 % — ABNORMAL HIGH (ref 4.6–6.5)

## 2017-10-01 LAB — LIPID PANEL
Cholesterol: 104 mg/dL (ref 0–200)
HDL: 30.6 mg/dL — ABNORMAL LOW (ref >39.00)
NonHDL: 73.3
Total CHOL/HDL Ratio: 3
Triglycerides: 229 mg/dL — ABNORMAL HIGH (ref 0.0–149.0)
VLDL: 45.8 mg/dL — ABNORMAL HIGH (ref 0.0–40.0)

## 2017-10-01 LAB — LDL CHOLESTEROL, DIRECT: Direct LDL: 45 mg/dL

## 2017-10-01 NOTE — Patient Instructions (Signed)

## 2017-10-01 NOTE — Progress Notes (Signed)
Subjective:  Patient ID: Stephen Kelly, male    DOB: 07/21/1940  Age: 77 y.o. MRN: [id number]  CC: The primary encounter diagnosis was Type 2 diabetes mellitus without complication, without long-term current use of insulin (HCC). Diagnoses of Long term current use of anticoagulant therapy, Hyperlipidemia associated with type 2 diabetes mellitus (HCC), Essential hypertension, Type 2 diabetes mellitus with microalbuminuria, without long-term current use of insulin (HCC), and Anticoagulation monitoring, INR range 2-3 were also pertinent to this visit.  HPI Stephen Kelly presents for 3 month follow up on diabetes.  Patient has no complaints today.  Patient is following a low glycemic index diet and taking metformin .   Twice daily  without side effects.  Not checking sugars because he has not been dizzy or felt that they were low.  Patient is not  exercising or intentionally trying to lose weight . 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    Does not skip meals. Patient has had an eye exam in the last 12 months and checks feet regularly for signs of infection.  Patient does not walk barefoot outside,  And denies an numbness tingling or burning in feet. Patient is up to date on all recommended vaccinations  Occasional left sided occipital scalp  Pain   Throbbing .  Resolves spontaneously /    Saw podiatry for foot pai n  Diagnosed with peroneal tendonitis on the left . Received a steroid shot, d given a boot to wear,  and callous shaved off on the right plantar forefoot    Lab Results  Component Value Date   MICROALBUR 8.3 (H) 07/02/2017   Lab Results  Component Value Date   WBC 8.2 10/01/2017   HGB 13.6 10/01/2017   HCT 40.3 10/01/2017   MCV 90.2 10/01/2017   PLT 253.0 10/01/2017       Lab Results  Component Value Date   INR 3.0 (H) 09/13/2017   INR 2.6 (H) 08/16/2017   INR 4.2 (H) 07/30/2017    Outpatient Medications Prior to Visit    Medication Sig Dispense Refill  . allopurinol (ZYLOPRIM) 300 MG tablet Take 1 tablet (300 mg total) by mouth daily. 90 tablet 2  . atorvastatin (LIPITOR) 20 MG tablet take 1 tablet by mouth once daily 90 tablet 1  . fluocinonide-emollient (LIDEX-E) 0.05 % cream Apply 1 application topically 2 (two) times daily. 60 g 2  . glipiZIDE (GLIPIZIDE XL) 2.5 MG 24 hr tablet Take 1 tablet (2.5 mg total) by mouth daily with breakfast. 90 tablet 1  . guaiFENesin-codeine (CHERATUSSIN AC) 100-10 MG/5ML syrup Take 5 mLs by mouth 3 (three) times daily as needed for cough. 180 mL 0  . hydrochlorothiazide (HYDRODIURIL) 25 MG tablet TAKE ONE (1) TABLET BY MOUTH EVERY DAY 90 tablet 1  . losartan (COZAAR) 100 MG tablet TAKE ONE TABLET BY MOUTH EVERY DAY 90 tablet 3  . metFORMIN (GLUCOPHAGE) 850 MG tablet Take 1 tablet (850 mg total) by mouth 2 (two) times daily with a meal. 60 tablet 3  . SHINGRIX injection   0  . warfarin (COUMADIN) 3 MG tablet Take 1 tablet (3 mg total) by mouth daily. 30 tablet 2  . meloxicam (MOBIC) 15 MG tablet Take 1 tablet (15 mg total) by mouth daily. 60 tablet 1   No facility-administered medications prior to visit.     Review of Systems;  Patient denies headache, fevers, malaise, unintentional  weight loss, skin rash, eye pain, sinus congestion and sinus pain, sore throat, dysphagia,  hemoptysis , cough, dyspnea, wheezing, chest pain, palpitations, orthopnea, edema, abdominal pain, nausea, melena, diarrhea, constipation, flank pain, dysuria, hematuria, urinary  Frequency, nocturia, numbness, tingling, seizures,  Focal weakness, Loss of consciousness,  Tremor, insomnia, depression, anxiety, and suicidal ideation.      Objective:  BP 122/74 (BP Location: Left Arm, Patient Position: Sitting, Cuff Size: Normal)   Pulse 82   Temp 97.7 F (36.5 C) (Oral)   Resp 14   Ht  (1.753 m)   Wt 202 lb 12.8 oz (92 kg)   SpO2 96%   BMI 29.95 kg/m   BP Readings from Last 3 Encounters:   10/01/17 122/74  07/02/17 112/66  03/17/17 134/72    Wt Readings from Last 3 Encounters:  10/01/17 202 lb 12.8 oz (92 kg)  07/02/17 205 lb 12.8 oz (93.4 kg)  03/17/17 198 lb (89.8 kg)    General appearance: alert, cooperative and appears stated age Ears: normal TM's and external ear canals both ears Throat: lips, mucosa, and tongue normal; teeth and gums normal Neck: no adenopathy, no carotid bruit, supple, symmetrical, trachea midline and thyroid not enlarged, symmetric, no tenderness/mass/nodules Back: symmetric, no curvature. ROM normal. No CVA tenderness. Lungs: clear to auscultation bilaterally Heart: regular rate and rhythm, S1, S2 normal, no murmur, click, rub or gallop Abdomen: soft, non-tender; bowel sounds normal; no masses,  no organomegaly Pulses: 2+ and symmetric Skin: Skin color, texture, turgor normal. No rashes or lesions Lymph nodes: Cervical, supraclavicular, and axillary nodes normal.  Lab Results  Component Value Date   HGBA1C 7.0 (H) 10/01/2017   HGBA1C 7.3 (H) 07/02/2017   HGBA1C 7.1 (H) 01/27/2017    Lab Results  Component Value Date   CREATININE 1.48 10/01/2017   CREATININE 1.46 07/02/2017   CREATININE 1.31 01/27/2017    Lab Results  Component Value Date   WBC 8.2 10/01/2017   HGB 13.6 10/01/2017   HCT 40.3 10/01/2017   PLT 253.0 10/01/2017   GLUCOSE 152 (H) 10/01/2017   CHOL 104 10/01/2017   TRIG 229.0 (H) 10/01/2017   HDL 30.60 (L) 10/01/2017   LDLDIRECT 45.0 10/01/2017   LDLCALC 17 03/17/2017   ALT 16 10/01/2017   AST 15 10/01/2017   NA 137 10/01/2017   K 4.3 10/01/2017   CL 103 10/01/2017   CREATININE 1.48 10/01/2017   BUN 24 (H) 10/01/2017   CO2 28 10/01/2017   TSH 2.10 03/11/2016   PSA 1.19 03/17/2017   INR 3.0 (H) 09/13/2017   HGBA1C 7.0 (H) 10/01/2017   MICROALBUR 8.3 (H) 07/02/2017    Dg Hand Complete Left  Result Date: 06/11/2014 CLINICAL DATA:  Bilateral hand pain and swelling for 3 weeks. Fourth and fifth digit  pain. EXAM: LEFT HAND - COMPLETE 3+ VIEW COMPARISON:  None. FINDINGS: No acute osseous or joint abnormality. No joint bodies narrowing, joint subluxation or erosive changes. IMPRESSION: No findings to explain the patient's pain. Electronically Signed   By: Leanna Battles M.D.   On: 06/11/2014 15:35   Dg Hand Complete Right  Result Date: 06/11/2014 CLINICAL DATA:  BILATERAL hand pain and swelling for 3 week, pain at lateral wrist, across palm, and in fourth and fifth digits of both hands, polyarthritis EXAM: RIGHT HAND - COMPLETE 3+ VIEW COMPARISON:  None FINDINGS: Osseous demineralization. Joint spaces preserved. No acute fracture, dislocation, or bone destruction. No definite erosive or inflammatory changes. IMPRESSION: No acute abnormalities. Osseous  demineralization diffusely. Electronically Signed   By: Ulyses Southward M.D.   On: 06/11/2014 15:34    Assessment & Plan:   Problem List Items Addressed This Visit    Type 2 diabetes mellitus with microalbuminuria (HCC) - Primary    Improved control since adding Januvia to metformin 850 mg bid.   No changes today   Patient is up-to-date on eye exams and foot exam is normal today. Patient has no microalbuminuria. Patient is tolerating statin therapy for CAD risk reduction and on ACE/ARB for  microalbuminuria and  hypertension   Lab Results  Component Value Date   HGBA1C 7.0 (H) 10/01/2017   Lab Results  Component Value Date   MICROALBUR 8.3 (H) 07/02/2017         Long term current use of anticoagulant therapy    NSAID use addressed and meloxicam discontinued. 4  Lab Results  Component Value Date   WBC 8.2 10/01/2017   HGB 13.6 10/01/2017   HCT 40.3 10/01/2017   MCV 90.2 10/01/2017   PLT 253.0 10/01/2017         Relevant Orders   CBC with Differential/Platelet (Completed)   Hypertension    Well controlled on current regimen. Renal function stable, no changes today.  Lab Results  Component Value Date   CREATININE 1.48 10/01/2017     Lab Results  Component Value Date   NA 137 10/01/2017   K 4.3 10/01/2017   CL 103 10/01/2017   CO2 28 10/01/2017         Hyperlipidemia associated with type 2 diabetes mellitus (HCC)    LDL is  < 70  with atorvastatin . LFTs are normal and he is tolerating therapy.   no changes today,   Lab Results  Component Value Date   CHOL 104 10/01/2017   HDL 30.60 (L) 10/01/2017   LDLCALC 17 03/17/2017   LDLDIRECT 45.0 10/01/2017   TRIG 229.0 (H) 10/01/2017   CHOLHDL 3 10/01/2017   Lab Results  Component Value Date   ALT 16 10/01/2017   AST 15 10/01/2017   ALKPHOS 39 10/01/2017   BILITOT 0.5 10/01/2017             Anticoagulation monitoring, INR range 2-3    INR has been at goal on current dose for the last 2 months repeat INR in 10 days .  He has been prescribed meloxicam without a PPI and the NSAID will need to be stopped ASAP.   Lab Results  Component Value Date   INR 3.0 (H) 09/13/2017   INR 2.6 (H) 08/16/2017   INR 4.2 (H) 07/30/2017            I have discontinued Pamelia Hoit B. Murch "[name]"'s meloxicam. I am also having him maintain his fluocinonide-emollient, guaiFENesin-codeine, allopurinol, losartan, hydrochlorothiazide, metFORMIN, glipiZIDE, warfarin, SHINGRIX, and atorvastatin.  No orders of the defined types were placed in this encounter.   Medications Discontinued During This Encounter  Medication Reason  . meloxicam (MOBIC) 15 MG tablet     Follow-up: Return in about 6 months (around 04/03/2018) for follow up diabetes.   Sherlene Shams, MD

## 2017-10-03 NOTE — Assessment & Plan Note (Signed)
Well controlled on current regimen. Renal function stable, no changes today.  Lab Results  Component Value Date   CREATININE 1.48 10/01/2017   Lab Results  Component Value Date   NA 137 10/01/2017   K 4.3 10/01/2017   CL 103 10/01/2017   CO2 28 10/01/2017

## 2017-10-03 NOTE — Assessment & Plan Note (Addendum)
NSAID use addressed and meloxicam discontinued. 4  Lab Results  Component Value Date   WBC 8.2 10/01/2017   HGB 13.6 10/01/2017   HCT 40.3 10/01/2017   MCV 90.2 10/01/2017   PLT 253.0 10/01/2017

## 2017-10-03 NOTE — Assessment & Plan Note (Signed)
INR has been at goal on current dose for the last 2 months repeat INR in 10 days .  He has been prescribed meloxicam without a PPI and the NSAID will need to be stopped ASAP.   Lab Results  Component Value Date   INR 3.0 (H) 09/13/2017   INR 2.6 (H) 08/16/2017   INR 4.2 (H) 07/30/2017

## 2017-10-03 NOTE — Assessment & Plan Note (Signed)
Improved control since adding Januvia to metformin 850 mg bid.   No changes today   Patient is up-to-date on eye exams and foot exam is normal today. Patient has no microalbuminuria. Patient is tolerating statin therapy for CAD risk reduction and on ACE/ARB for  microalbuminuria and  hypertension   Lab Results  Component Value Date   HGBA1C 7.0 (H) 10/01/2017   Lab Results  Component Value Date   MICROALBUR 8.3 (H) 07/02/2017

## 2017-10-03 NOTE — Assessment & Plan Note (Signed)
LDL is  < 70  with atorvastatin . LFTs are normal and he is tolerating therapy.   no changes today,   Lab Results  Component Value Date   CHOL 104 10/01/2017   HDL 30.60 (L) 10/01/2017   LDLCALC 17 03/17/2017   LDLDIRECT 45.0 10/01/2017   TRIG 229.0 (H) 10/01/2017   CHOLHDL 3 10/01/2017   Lab Results  Component Value Date   ALT 16 10/01/2017   AST 15 10/01/2017   ALKPHOS 39 10/01/2017   BILITOT 0.5 10/01/2017

## 2017-10-04 ENCOUNTER — Encounter: Payer: Self-pay | Admitting: Internal Medicine

## 2017-10-14 ENCOUNTER — Other Ambulatory Visit: Payer: Self-pay | Admitting: Radiology

## 2017-10-14 DIAGNOSIS — Z5181 Encounter for therapeutic drug level monitoring: Secondary | ICD-10-CM

## 2017-10-14 DIAGNOSIS — Z7901 Long term (current) use of anticoagulants: Principal | ICD-10-CM

## 2017-10-15 ENCOUNTER — Other Ambulatory Visit (INDEPENDENT_AMBULATORY_CARE_PROVIDER_SITE_OTHER): Payer: Medicare Other

## 2017-10-15 ENCOUNTER — Encounter: Payer: Self-pay | Admitting: Podiatry

## 2017-10-15 ENCOUNTER — Ambulatory Visit: Payer: Medicare Other | Admitting: Podiatry

## 2017-10-15 DIAGNOSIS — Q828 Other specified congenital malformations of skin: Secondary | ICD-10-CM | POA: Diagnosis not present

## 2017-10-15 DIAGNOSIS — Z7901 Long term (current) use of anticoagulants: Secondary | ICD-10-CM | POA: Diagnosis not present

## 2017-10-15 DIAGNOSIS — M7672 Peroneal tendinitis, left leg: Secondary | ICD-10-CM

## 2017-10-15 DIAGNOSIS — Z5181 Encounter for therapeutic drug level monitoring: Secondary | ICD-10-CM | POA: Diagnosis not present

## 2017-10-15 LAB — PROTIME-INR
INR: 4.2 ratio — ABNORMAL HIGH (ref 0.8–1.0)
Prothrombin Time: 47.6 s — ABNORMAL HIGH (ref 9.6–13.1)

## 2017-10-18 NOTE — Progress Notes (Signed)
   HPI: 77 year old male presenting today for follow up evaluation of peroneal tendinitis of the left foot. He states the foot is doing well. He denies any pain or new complaints at this time. Patient is here for further evaluation and treatment.   Past Medical History:  Diagnosis Date  . Clotting disorder (HCC) 2011   Lupus Inhibitor positive by March 2012 studies  . Gout, arthritis    managed with allopurinol  . History of renal calculi 2001   history of lithotripsy ,  no stent  . Hyperlipidemia   . Hypertension   . Lupus anticoagulant inhibitor syndrome [facility])    per gitten's test March 2012  . Patent foramen ovale   . Pulmonary nodule, right 2011   unchanged, followed by gitten with serial ct     Physical Exam: General: The patient is alert and oriented x3 in no acute distress.  Dermatology: Skin is warm, dry and supple bilateral lower extremities. Negative for open lesions or macerations.  Vascular: Palpable pedal pulses bilaterally. No edema or erythema noted. Capillary refill within normal limits.  Neurological: Epicritic and protective threshold grossly intact bilaterally.   Musculoskeletal Exam: Range of motion within normal limits to all pedal and ankle joints bilateral. Muscle strength 5/5 in all groups bilateral.   Assessment: 1. Peroneal tendinitis left - resolved    Plan of Care:  1. Patient evaluated.  2. Resume wearing good shoe gear. Discontinue wearing CAM boot.  3. May resume full activity with no restrictions.  4. Continue taking Meloxicam as needed.  5. Return to clinic as needed.       Felecia Shelling, DPM [facility]  Dr. Felecia Shelling, DPM    [address] [place], [place] [address]                Office [phone]  Fax [phone]

## 2017-10-19 ENCOUNTER — Other Ambulatory Visit: Payer: Self-pay | Admitting: Internal Medicine

## 2017-10-21 ENCOUNTER — Other Ambulatory Visit: Payer: Self-pay | Admitting: Internal Medicine

## 2017-10-21 DIAGNOSIS — Z7901 Long term (current) use of anticoagulants: Secondary | ICD-10-CM

## 2017-10-21 MED ORDER — WARFARIN SODIUM 2.5 MG PO TABS: 2.5000 mg | ORAL_TABLET | Freq: Every day | ORAL | 0 refills | Status: DC

## 2017-10-29 ENCOUNTER — Other Ambulatory Visit: Payer: Self-pay | Admitting: Radiology

## 2017-10-29 DIAGNOSIS — Z7901 Long term (current) use of anticoagulants: Principal | ICD-10-CM

## 2017-10-29 DIAGNOSIS — Z5181 Encounter for therapeutic drug level monitoring: Secondary | ICD-10-CM

## 2017-11-01 ENCOUNTER — Other Ambulatory Visit (INDEPENDENT_AMBULATORY_CARE_PROVIDER_SITE_OTHER): Payer: Medicare Other

## 2017-11-01 DIAGNOSIS — Z7901 Long term (current) use of anticoagulants: Secondary | ICD-10-CM

## 2017-11-01 DIAGNOSIS — Z5181 Encounter for therapeutic drug level monitoring: Secondary | ICD-10-CM

## 2017-11-01 LAB — PROTIME-INR
INR: 1.7 ratio — ABNORMAL HIGH (ref 0.8–1.0)
Prothrombin Time: 19.8 s — ABNORMAL HIGH (ref 9.6–13.1)

## 2017-11-04 ENCOUNTER — Telehealth: Payer: Self-pay | Admitting: Internal Medicine

## 2017-11-04 NOTE — Telephone Encounter (Signed)
Copied from CRM [phone]. Topic: General - Other >> Nov 04, 2017  2:08 PM Tamela OddiHarris, Cierrah Dace J wrote: Reason for CRM: Patient returning call regarding his lab results.

## 2017-11-05 ENCOUNTER — Other Ambulatory Visit: Payer: Self-pay | Admitting: Internal Medicine

## 2017-11-05 NOTE — Telephone Encounter (Signed)
[place] not go home, [place] [facility] and stated he thought he was taking 2.5 mg coumadin , he would try to make it home before 5 and notify office of coumadin dose. Called patient pharmacy to see last coumadin dose picked up by patient from pharmacy last dose patient received from pharmacy was coumadin 2.5 mg.

## 2017-11-05 NOTE — Telephone Encounter (Signed)
Follow up lab scheduled.

## 2017-11-05 NOTE — Telephone Encounter (Signed)
If we do not hear from pt prior to leaving, please try reaching him again.  INR is a little low.  If he is on coumadin 2.5mg  q day, then increase to 5mg  on Monday and Friday and continue 2.5mg  all other days.  Recheck pt/inr in one week.

## 2017-11-05 NOTE — Telephone Encounter (Signed)
Patient notified and voiced understanding had patient write down dosing and read back.

## 2017-11-10 ENCOUNTER — Other Ambulatory Visit: Payer: Self-pay | Admitting: Radiology

## 2017-11-10 DIAGNOSIS — Z7901 Long term (current) use of anticoagulants: Secondary | ICD-10-CM

## 2017-11-10 DIAGNOSIS — Z5181 Encounter for therapeutic drug level monitoring: Secondary | ICD-10-CM

## 2017-11-11 ENCOUNTER — Other Ambulatory Visit (INDEPENDENT_AMBULATORY_CARE_PROVIDER_SITE_OTHER): Payer: Medicare Other

## 2017-11-11 DIAGNOSIS — Z7901 Long term (current) use of anticoagulants: Secondary | ICD-10-CM

## 2017-11-11 DIAGNOSIS — Z5181 Encounter for therapeutic drug level monitoring: Secondary | ICD-10-CM

## 2017-11-11 LAB — PROTIME-INR
INR: 1.8 ratio — ABNORMAL HIGH (ref 0.8–1.0)
Prothrombin Time: 20.5 s — ABNORMAL HIGH (ref 9.6–13.1)

## 2017-11-12 MED ORDER — WARFARIN SODIUM 3 MG PO TABS: 3.0000 mg | ORAL_TABLET | Freq: Every day | ORAL | 0 refills | Status: DC

## 2017-11-22 ENCOUNTER — Telehealth: Payer: Self-pay | Admitting: *Deleted

## 2017-11-22 DIAGNOSIS — Z7901 Long term (current) use of anticoagulants: Secondary | ICD-10-CM

## 2017-11-22 NOTE — Telephone Encounter (Signed)
Standing orders placed for Pt/INR

## 2017-11-23 ENCOUNTER — Other Ambulatory Visit (INDEPENDENT_AMBULATORY_CARE_PROVIDER_SITE_OTHER): Payer: Medicare Other

## 2017-11-23 DIAGNOSIS — Z7901 Long term (current) use of anticoagulants: Secondary | ICD-10-CM | POA: Diagnosis not present

## 2017-11-23 DIAGNOSIS — E119 Type 2 diabetes mellitus without complications: Secondary | ICD-10-CM | POA: Diagnosis not present

## 2017-11-23 LAB — HM DIABETES EYE EXAM

## 2017-11-23 LAB — PROTIME-INR
INR: 1.7 ratio — ABNORMAL HIGH (ref 0.8–1.0)
Prothrombin Time: 19.8 s — ABNORMAL HIGH (ref 9.6–13.1)

## 2017-11-29 ENCOUNTER — Other Ambulatory Visit: Payer: Self-pay | Admitting: Internal Medicine

## 2017-12-08 ENCOUNTER — Other Ambulatory Visit (INDEPENDENT_AMBULATORY_CARE_PROVIDER_SITE_OTHER): Payer: Medicare Other

## 2017-12-08 DIAGNOSIS — Z7901 Long term (current) use of anticoagulants: Secondary | ICD-10-CM

## 2017-12-08 LAB — PROTIME-INR
INR: 3.1 ratio — ABNORMAL HIGH (ref 0.8–1.0)
Prothrombin Time: 35.7 s — ABNORMAL HIGH (ref 9.6–13.1)

## 2017-12-29 ENCOUNTER — Other Ambulatory Visit: Payer: Self-pay | Admitting: Internal Medicine

## 2018-01-04 ENCOUNTER — Other Ambulatory Visit (INDEPENDENT_AMBULATORY_CARE_PROVIDER_SITE_OTHER): Payer: Medicare Other

## 2018-01-04 DIAGNOSIS — Z7901 Long term (current) use of anticoagulants: Secondary | ICD-10-CM | POA: Diagnosis not present

## 2018-01-04 LAB — PROTIME-INR
INR: 2.7 ratio — ABNORMAL HIGH (ref 0.8–1.0)
Prothrombin Time: 31.5 s — ABNORMAL HIGH (ref 9.6–13.1)

## 2018-01-18 ENCOUNTER — Other Ambulatory Visit: Payer: Self-pay | Admitting: Internal Medicine

## 2018-02-09 ENCOUNTER — Other Ambulatory Visit (INDEPENDENT_AMBULATORY_CARE_PROVIDER_SITE_OTHER): Payer: Medicare Other

## 2018-02-09 DIAGNOSIS — Z7901 Long term (current) use of anticoagulants: Secondary | ICD-10-CM | POA: Diagnosis not present

## 2018-02-09 LAB — PROTIME-INR
INR: 2.8 ratio — ABNORMAL HIGH (ref 0.8–1.0)
Prothrombin Time: 32.1 s — ABNORMAL HIGH (ref 9.6–13.1)

## 2018-02-11 ENCOUNTER — Other Ambulatory Visit: Payer: Self-pay | Admitting: Radiology

## 2018-02-11 DIAGNOSIS — Z5181 Encounter for therapeutic drug level monitoring: Secondary | ICD-10-CM

## 2018-02-11 DIAGNOSIS — Z7901 Long term (current) use of anticoagulants: Principal | ICD-10-CM

## 2018-03-01 ENCOUNTER — Ambulatory Visit: Payer: Medicare Other | Admitting: Internal Medicine

## 2018-03-01 ENCOUNTER — Ambulatory Visit: Payer: Self-pay | Admitting: Internal Medicine

## 2018-03-01 ENCOUNTER — Encounter: Payer: Self-pay | Admitting: Internal Medicine

## 2018-03-01 VITALS — BP 110/70 | HR 90 | Temp 97.5°F | Resp 15 | Ht 69.0 in | Wt 195.8 lb

## 2018-03-01 DIAGNOSIS — R112 Nausea with vomiting, unspecified: Secondary | ICD-10-CM | POA: Diagnosis not present

## 2018-03-01 DIAGNOSIS — E1129 Type 2 diabetes mellitus with other diabetic kidney complication: Secondary | ICD-10-CM | POA: Diagnosis not present

## 2018-03-01 DIAGNOSIS — R809 Proteinuria, unspecified: Secondary | ICD-10-CM | POA: Diagnosis not present

## 2018-03-01 DIAGNOSIS — J101 Influenza due to other identified influenza virus with other respiratory manifestations: Secondary | ICD-10-CM

## 2018-03-01 DIAGNOSIS — R42 Dizziness and giddiness: Secondary | ICD-10-CM | POA: Diagnosis not present

## 2018-03-01 LAB — POCT INFLUENZA A/B
Influenza A, POC: POSITIVE — AB
Influenza B, POC: POSITIVE — AB

## 2018-03-01 LAB — GLUCOSE, POCT (MANUAL RESULT ENTRY): POC Glucose: 216 mg/dl — AB (ref 70–99)

## 2018-03-01 MED ORDER — PREDNISONE 10 MG PO TABS: ORAL_TABLET | ORAL | 0 refills | Status: DC

## 2018-03-01 MED ORDER — PROMETHAZINE HCL 25 MG/ML IJ SOLN: 25.0000 mg | Freq: Once | INTRAMUSCULAR | Status: DC

## 2018-03-01 MED ORDER — ONDANSETRON 4 MG PO TBDP: 4.0000 mg | ORAL_TABLET | Freq: Three times a day (TID) | ORAL | 0 refills | Status: DC | PRN

## 2018-03-01 MED ORDER — PROMETHAZINE HCL 25 MG/ML IJ SOLN
25.0000 mg | Freq: Once | INTRAMUSCULAR | Status: AC
  Administered 2018-03-01: 25 mg via INTRAMUSCULAR

## 2018-03-01 MED ORDER — OSELTAMIVIR PHOSPHATE 75 MG PO CAPS: 75.0000 mg | ORAL_CAPSULE | Freq: Two times a day (BID) | ORAL | 0 refills | Status: DC

## 2018-03-01 NOTE — Progress Notes (Signed)
Subjective:  Patient ID: Stephen Kelly, male    DOB: [birth year]  Age: 77 y.o. MRN: [id number]  CC: The primary encounter diagnosis was Vertigo. Diagnoses of Influenza A, Type 2 diabetes mellitus with microalbuminuria, without long-term current use of insulin (HCC), and Nausea and vomiting, intractability of vomiting not specified, unspecified vomiting type were also pertinent to this visit.  HPI Stephen Kelly presents for new onset vertigo with vomiting,  Started at 2 am this morning when he got up to void.  Symptoms abated with supine position , but when he woke up  Several hours later  He developed nausea and vertigo with any position other than lying down, accompanied by diaphoresis and chills . No respiratory symptoms.    Did   not check his temp at home .  Last emesis was 11:00 after trying to eat a pimiento cheese sandwhich,   Took his meds but not sure they stayed down.  CBG here in office 216 . Has vomited some liquid since then.   Has not had the flu vaccine yet.  No recent sick contacts  Became overheated yesterday  unloading 2 truck loads of wood   Rapid flu test is faintly positive for A & B   Lab Results  Component Value Date   INR 2.8 (H) 02/09/2018   INR 2.7 (H) 01/04/2018   INR 3.1 (H) 12/08/2017      Outpatient Medications Prior to Visit  Medication Sig Dispense Refill  . allopurinol (ZYLOPRIM) 300 MG tablet take 1 tablet by mouth once daily 90 tablet 1  . atorvastatin (LIPITOR) 20 MG tablet take 1 tablet by mouth once daily 90 tablet 1  . fluocinonide-emollient (LIDEX-E) 0.05 % cream Apply 1 application topically 2 (two) times daily. 60 g 2  . glipiZIDE (GLUCOTROL XL) 2.5 MG 24 hr tablet take 1 tablet by mouth every morning with BREAKFAST 90 tablet 1  . hydrochlorothiazide (HYDRODIURIL) 25 MG tablet take 1 tablet by mouth once daily 90 tablet 1  . losartan (COZAAR) 100 MG tablet TAKE ONE TABLET BY MOUTH EVERY DAY 90 tablet 3  . metFORMIN (GLUCOPHAGE) 850 MG  tablet take 1 tablet by mouth twice a day with food 180 tablet 1  . SHINGRIX injection   0  . warfarin (COUMADIN) 3 MG tablet TAKE 1 TABLET BY MOUTH ONCE DAILY 90 tablet 0  . guaiFENesin-codeine (CHERATUSSIN AC) 100-10 MG/5ML syrup Take 5 mLs by mouth 3 (three) times daily as needed for cough. (Patient not taking: Reported on 03/01/2018) 180 mL 0   No facility-administered medications prior to visit.     Review of Systems;  Patient denies headache, fevers, malaise, unintentional weight loss, skin rash, eye pain, sinus congestion and sinus pain, sore throat, dysphagia,  hemoptysis , cough, dyspnea, wheezing, chest pain, palpitations, orthopnea, edema, abdominal pain, nausea, melena, diarrhea, constipation, flank pain, dysuria, hematuria, urinary  Frequency, nocturia, numbness, tingling, seizures,  Focal weakness, Loss of consciousness,  Tremor, insomnia, depression, anxiety, and suicidal ideation.      Objective:  BP 110/70 (BP Location: Left Arm, Patient Position: Sitting, Cuff Size: Normal)   Pulse 90   Temp (!) 97.5 F (36.4 C) (Oral)   Resp 15   Ht 5\' 9"  (1.753 m)   Wt 195 lb 12.8 oz (88.8 kg)   SpO2 95%   BMI 28.91 kg/m   BP Readings from Last 3 Encounters:  03/01/18 110/70  10/01/17 122/74  07/02/17 112/66    Wt Readings from Last  3 Encounters:  03/01/18 195 lb 12.8 oz (88.8 kg)  10/01/17 202 lb 12.8 oz (92 kg)  07/02/17 205 lb 12.8 oz (93.4 kg)    General appearance: alert, cooperative and appears stated age Ears: normal TM's and external ear canals both ears Throat: lips, mucosa, and tongue normal; teeth and gums normal Neck: no adenopathy, no carotid bruit, supple, symmetrical, trachea midline and thyroid not enlarged, symmetric, no tenderness/mass/nodules Back: symmetric, no curvature. ROM normal. No CVA tenderness. Lungs: clear to auscultation bilaterally Heart: regular rate and rhythm, S1, S2 normal, no murmur, click, rub or gallop Abdomen: soft, non-tender;  bowel sounds normal; no masses,  no organomegaly Pulses: 2+ and symmetric Skin: Skin color, texture, turgor normal. No rashes or lesions Lymph nodes: Cervical, supraclavicular, and axillary nodes normal.  Lab Results  Component Value Date   HGBA1C 7.0 (H) 10/01/2017   HGBA1C 7.3 (H) 07/02/2017   HGBA1C 7.1 (H) 01/27/2017    Lab Results  Component Value Date   CREATININE 1.48 10/01/2017   CREATININE 1.46 07/02/2017   CREATININE 1.31 01/27/2017    Lab Results  Component Value Date   WBC 8.2 10/01/2017   HGB 13.6 10/01/2017   HCT 40.3 10/01/2017   PLT 253.0 10/01/2017   GLUCOSE 152 (H) 10/01/2017   CHOL 104 10/01/2017   TRIG 229.0 (H) 10/01/2017   HDL 30.60 (L) 10/01/2017   LDLDIRECT 45.0 10/01/2017   LDLCALC 17 03/17/2017   ALT 16 10/01/2017   AST 15 10/01/2017   NA 137 10/01/2017   K 4.3 10/01/2017   CL 103 10/01/2017   CREATININE 1.48 10/01/2017   BUN 24 (H) 10/01/2017   CO2 28 10/01/2017   TSH 2.10 03/11/2016   PSA 1.19 03/17/2017   INR 2.8 (H) 02/09/2018   HGBA1C 7.0 (H) 10/01/2017   MICROALBUR 8.3 (H) 07/02/2017    Dg Hand Complete Left  Result Date: 06/11/2014 CLINICAL DATA:  Bilateral hand pain and swelling for 3 weeks. Fourth and fifth digit pain. EXAM: LEFT HAND - COMPLETE 3+ VIEW COMPARISON:  None. FINDINGS: No acute osseous or joint abnormality. No joint bodies narrowing, joint subluxation or erosive changes. IMPRESSION: No findings to explain the patient's pain. Electronically Signed   By: Leanna Battles M.D.   On: 06/11/2014 15:35   Dg Hand Complete Right  Result Date: 06/11/2014 CLINICAL DATA:  BILATERAL hand pain and swelling for 3 week, pain at lateral wrist, across palm, and in fourth and fifth digits of both hands, polyarthritis EXAM: RIGHT HAND - COMPLETE 3+ VIEW COMPARISON:  None FINDINGS: Osseous demineralization. Joint spaces preserved. No acute fracture, dislocation, or bone destruction. No definite erosive or inflammatory changes. IMPRESSION:  No acute abnormalities. Osseous demineralization diffusely. Electronically Signed   By: Ulyses Southward M.D.   On: 06/11/2014 15:34    Assessment & Plan:   Problem List Items Addressed This Visit    Influenza A    Presumed given new onset vertigo with nausea and vomiting and rapid flu faintly positive .  Tamiflu,  zofran prescribed.  Phenergan IM injection given .advised to suspend BP and diabetes meds until taking good po.  Advised to see ER if unable to maintain hydration        Relevant Medications   oseltamivir (TAMIFLU) 75 MG capsule   Type 2 diabetes mellitus with microalbuminuria (HCC)   Relevant Orders   POCT Glucose (CBG) (Completed)    Other Visit Diagnoses    Vertigo    -  Primary   Relevant  Medications   promethazine (PHENERGAN) injection 25 mg (Completed)   ondansetron (ZOFRAN ODT) 4 MG disintegrating tablet   Other Relevant Orders   POCT Influenza A/B (Completed)   Nausea and vomiting, intractability of vomiting not specified, unspecified vomiting type          I am having Pamelia Hoit B. Forgione "[name]" start on ondansetron, oseltamivir, and predniSONE. I am also having him maintain his fluocinonide-emollient, guaiFENesin-codeine, losartan, SHINGRIX, atorvastatin, allopurinol, metFORMIN, hydrochlorothiazide, glipiZIDE, and warfarin. We administered promethazine.  Meds ordered this encounter  Medications  . promethazine (PHENERGAN) injection 25 mg  . ondansetron (ZOFRAN ODT) 4 MG disintegrating tablet    Sig: Take 1 tablet (4 mg total) by mouth every 8 (eight) hours as needed for nausea or vomiting.    Dispense:  20 tablet    Refill:  0  . oseltamivir (TAMIFLU) 75 MG capsule    Sig: Take 1 capsule (75 mg total) by mouth 2 (two) times daily.    Dispense:  10 capsule    Refill:  0  . predniSONE (DELTASONE) 10 MG tablet    Sig: 6 tablets on Day 1 , then reduce by 1 tablet daily until gone    Dispense:  21 tablet    Refill:  0  . DISCONTD: promethazine (PHENERGAN) injection  25 mg    Medications Discontinued During This Encounter  Medication Reason  . promethazine (PHENERGAN) injection 25 mg     Follow-up: No follow-ups on file.   Sherlene Shams, MD

## 2018-03-01 NOTE — Telephone Encounter (Signed)
Outgoing call to patient who complains of being dizzy and sweating since 2 am.  Patient states that "everything is spinning around.". States that he is not weak just "staggering around like Im  Drunk." Patient rates it severe.  Patient states I alright as long as Im laying down.  Unable to assess heart rate.  Does not know what could be causing the dizziness. This is the first occurrence in his life.  States that he has vomited 2 to 3 times also.  Blood sugar is 152 this am.  Reviewed care advice with him.  Voiced understanding.  Patient scheduled for appointment today 03/01/18 at 100pm with Dr.  Darrick Huntsman. Patient voiced understanding and gratitude.  Reason for Disposition . [1] MODERATE dizziness (e.g., interferes with normal activities) AND [2] has NOT been evaluated by physician for this  (Exception: dizziness caused by heat exposure, sudden standing, or poor fluid intake)  Answer Assessment - Initial Assessment Questions 1. DESCRIPTION: "Describe your dizziness."     Everything spinning  Around.  2. LIGHTHEADED: "Do you feel lightheaded?" (e.g., somewhat faint, woozy, weak upon standing)     Staggering like Im drunk.   3. VERTIGO: "Do you feel like either you or the room is spinning or tilting?" (i.e. vertigo)      The room is spinning around  4. SEVERITY: "How bad is it?"  "Do you feel like you are going to faint?" "Can you stand and walk?"   - MILD - walking normally   - MODERATE - interferes with normal activities (e.g., work, school)    - SEVERE - unable to stand, requires support to walk, feels like passing out now.      severe 5. ONSET:  "When did the dizziness begin?"     2:00 am this morning. 6. AGGRAVATING FACTORS: "Does anything make it worse?" (e.g., standing, change in head position)    As long as Im laying in the bed  Im ok  7. HEART RATE: "Can you tell me your heart rate?" "How many beats in 15 seconds?"  (Note: not all patients can do this)       *No Answer* 8. CAUSE: "What do  you think is causing the dizziness?"    No I dont.   9. RECURRENT SYMPTOM: "Have you had dizziness before?" If so, ask: "When was the last time?" "What happened that time?"    First time 10. OTHER SYMPTOMS: "Do you have any other symptoms?" (e.g., fever, chest pain, vomiting, diarrhea, bleeding)      Vomited  2 to 3.  Water. 11. PREGNANCY: "Is there any chance you are pregnant?" "When was your last menstrual period?"       na  Protocols used: DIZZINESS [facility]

## 2018-03-01 NOTE — Patient Instructions (Addendum)
I have prescribed Tamiflu to take two times daily for the next 5 days  Zofran under the tongue every 6 hours as needed for nausea  Tylenol as needed for headache,  Body aches  Clear liquid diet until nausea resolves  Do not take your blood pressure or diabetes medications until you are eating and drinking normally  Do not start the prednisone tomorrow,  And only  If  the dizziness is still bothering you  Bonita Quin needs to call Dr Gaynell Face for her own flu exposure medicine    Influenza, Adult Influenza ("the flu") is an infection in the lungs, nose, and throat (respiratory tract). It is caused by a virus. The flu causes many common cold symptoms, as well as a high fever and body aches. It can make you feel very sick. The flu spreads easily from person to person (is contagious). Getting a flu shot (influenza vaccination) every year is the best way to prevent the flu. Follow these instructions at home:  Take over-the-counter and prescription medicines only as told by your doctor.  Use a cool mist humidifier to add moisture (humidity) to the air in your home. This can make it easier to breathe.  Rest as needed.  Drink enough fluid to keep your pee (urine) clear or pale yellow.  Cover your mouth and nose when you cough or sneeze.  Wash your hands with soap and water often, especially after you cough or sneeze. If you cannot use soap and water, use hand sanitizer.  Stay home from work or school as told by your doctor. Unless you are visiting your doctor, try to avoid leaving home until your fever has been gone for 24 hours without the use of medicine.  Keep all follow-up visits as told by your doctor. This is important. How is this prevented?  Getting a yearly (annual) flu shot is the best way to avoid getting the flu. You may get the flu shot in late summer, fall, or winter. Ask your doctor when you should get your flu shot.  Wash your hands often or use hand sanitizer often.  Avoid  contact with people who are sick during cold and flu season.  Eat healthy foods.  Drink plenty of fluids.  Get enough sleep.  Exercise regularly. Contact a doctor if:  You get new symptoms.  You have: ? Chest pain. ? Watery poop (diarrhea). ? A fever.  Your cough gets worse.  You start to have more mucus.  You feel sick to your stomach (nauseous).  You throw up (vomit). Get help right away if:  You start to be short of breath or have trouble breathing.  Your skin or nails turn a bluish color.  You have very bad pain or stiffness in your neck.  You get a sudden headache.  You get sudden pain in your face or ear.  You cannot stop throwing up. This information is not intended to replace advice given to you by your health care provider. Make sure you discuss any questions you have with your health care provider. Document Released: 02/18/2008 Document Revised: 10/17/2015 Document Reviewed: 03/05/2015 Elsevier Interactive Patient Education  2017 [employer].

## 2018-03-03 DIAGNOSIS — J101 Influenza due to other identified influenza virus with other respiratory manifestations: Secondary | ICD-10-CM | POA: Insufficient documentation

## 2018-03-03 NOTE — Assessment & Plan Note (Addendum)
Presumed given new onset vertigo with nausea and vomiting and rapid flu faintly positive .  Tamiflu,  zofran prescribed.  Phenergan IM injection given .advised to suspend BP and diabetes meds until taking good po.  Advised to see ER if unable to maintain hydration

## 2018-03-09 ENCOUNTER — Other Ambulatory Visit: Payer: Self-pay | Admitting: Family Medicine

## 2018-03-14 ENCOUNTER — Other Ambulatory Visit: Payer: Medicare Other

## 2018-03-15 DIAGNOSIS — D225 Melanocytic nevi of trunk: Secondary | ICD-10-CM | POA: Diagnosis not present

## 2018-03-15 DIAGNOSIS — L570 Actinic keratosis: Secondary | ICD-10-CM | POA: Diagnosis not present

## 2018-03-15 DIAGNOSIS — D2261 Melanocytic nevi of right upper limb, including shoulder: Secondary | ICD-10-CM | POA: Diagnosis not present

## 2018-03-15 DIAGNOSIS — D2262 Melanocytic nevi of left upper limb, including shoulder: Secondary | ICD-10-CM | POA: Diagnosis not present

## 2018-03-24 ENCOUNTER — Encounter: Payer: Self-pay | Admitting: Internal Medicine

## 2018-03-24 ENCOUNTER — Ambulatory Visit (INDEPENDENT_AMBULATORY_CARE_PROVIDER_SITE_OTHER): Payer: Medicare Other | Admitting: Internal Medicine

## 2018-03-24 ENCOUNTER — Telehealth: Payer: Self-pay | Admitting: Internal Medicine

## 2018-03-24 VITALS — BP 120/78 | HR 90 | Temp 97.8°F | Resp 16 | Ht 69.0 in | Wt 197.2 lb

## 2018-03-24 DIAGNOSIS — E1129 Type 2 diabetes mellitus with other diabetic kidney complication: Secondary | ICD-10-CM

## 2018-03-24 DIAGNOSIS — R5383 Other fatigue: Secondary | ICD-10-CM | POA: Diagnosis not present

## 2018-03-24 DIAGNOSIS — E782 Mixed hyperlipidemia: Secondary | ICD-10-CM | POA: Diagnosis not present

## 2018-03-24 DIAGNOSIS — R51 Headache: Secondary | ICD-10-CM | POA: Diagnosis not present

## 2018-03-24 DIAGNOSIS — Z23 Encounter for immunization: Secondary | ICD-10-CM | POA: Diagnosis not present

## 2018-03-24 DIAGNOSIS — H814 Vertigo of central origin: Secondary | ICD-10-CM

## 2018-03-24 DIAGNOSIS — Z0000 Encounter for general adult medical examination without abnormal findings: Secondary | ICD-10-CM

## 2018-03-24 DIAGNOSIS — R0683 Snoring: Secondary | ICD-10-CM | POA: Diagnosis not present

## 2018-03-24 DIAGNOSIS — R809 Proteinuria, unspecified: Secondary | ICD-10-CM | POA: Diagnosis not present

## 2018-03-24 DIAGNOSIS — E785 Hyperlipidemia, unspecified: Secondary | ICD-10-CM

## 2018-03-24 DIAGNOSIS — Z7901 Long term (current) use of anticoagulants: Secondary | ICD-10-CM

## 2018-03-24 DIAGNOSIS — H25013 Cortical age-related cataract, bilateral: Secondary | ICD-10-CM | POA: Insufficient documentation

## 2018-03-24 DIAGNOSIS — R519 Headache, unspecified: Secondary | ICD-10-CM | POA: Insufficient documentation

## 2018-03-24 DIAGNOSIS — E1169 Type 2 diabetes mellitus with other specified complication: Secondary | ICD-10-CM

## 2018-03-24 DIAGNOSIS — M130 Polyarthritis, unspecified: Secondary | ICD-10-CM

## 2018-03-24 LAB — COMPREHENSIVE METABOLIC PANEL
ALT: 12 U/L (ref 0–53)
AST: 13 U/L (ref 0–37)
Albumin: 4.2 g/dL (ref 3.5–5.2)
Alkaline Phosphatase: 46 U/L (ref 39–117)
BUN: 28 mg/dL — ABNORMAL HIGH (ref 6–23)
CO2: 31 mEq/L (ref 19–32)
Calcium: 9.6 mg/dL (ref 8.4–10.5)
Chloride: 104 mEq/L (ref 96–112)
Creatinine, Ser: 1.7 mg/dL — ABNORMAL HIGH (ref 0.40–1.50)
GFR: 41.78 mL/min — ABNORMAL LOW (ref >60.00)
Glucose, Bld: 130 mg/dL — ABNORMAL HIGH (ref 70–99)
Potassium: 4.1 mEq/L (ref 3.5–5.1)
Sodium: 140 mEq/L (ref 135–145)
Total Bilirubin: 0.5 mg/dL (ref 0.2–1.2)
Total Protein: 6.9 g/dL (ref 6.0–8.3)

## 2018-03-24 LAB — CBC WITH DIFFERENTIAL/PLATELET
Basophils Absolute: 0.1 10*3/uL (ref 0.0–0.1)
Basophils Relative: 0.6 % (ref 0.0–3.0)
Eosinophils Absolute: 0.3 10*3/uL (ref 0.0–0.7)
Eosinophils Relative: 3.6 % (ref 0.0–5.0)
HCT: 39.9 % (ref 39.0–52.0)
Hemoglobin: 13.5 g/dL (ref 13.0–17.0)
Lymphocytes Relative: 27.4 % (ref 12.0–46.0)
Lymphs Abs: 2.4 10*3/uL (ref 0.7–4.0)
MCHC: 33.8 g/dL (ref 30.0–36.0)
MCV: 89.9 fl (ref 78.0–100.0)
Monocytes Absolute: 0.6 10*3/uL (ref 0.1–1.0)
Monocytes Relative: 7 % (ref 3.0–12.0)
Neutro Abs: 5.5 10*3/uL (ref 1.4–7.7)
Neutrophils Relative %: 61.4 % (ref 43.0–77.0)
Platelets: 309 10*3/uL (ref 150.0–400.0)
RBC: 4.43 Mil/uL (ref 4.22–5.81)
RDW: 14.5 % (ref 11.5–15.5)
WBC: 8.9 10*3/uL (ref 4.0–10.5)

## 2018-03-24 LAB — LIPID PANEL
Cholesterol: 101 mg/dL (ref 0–200)
HDL: 31.7 mg/dL — ABNORMAL LOW (ref >39.00)
NonHDL: 69.53
Total CHOL/HDL Ratio: 3
Triglycerides: 243 mg/dL — ABNORMAL HIGH (ref 0.0–149.0)
VLDL: 48.6 mg/dL — ABNORMAL HIGH (ref 0.0–40.0)

## 2018-03-24 LAB — MICROALBUMIN / CREATININE URINE RATIO
Creatinine,U: 158.4 mg/dL
Microalb Creat Ratio: 4.2 mg/g (ref 0.0–30.0)
Microalb, Ur: 6.6 mg/dL — ABNORMAL HIGH (ref 0.0–1.9)

## 2018-03-24 LAB — PROTIME-INR
INR: 4.9 ratio — ABNORMAL HIGH (ref 0.8–1.0)
Prothrombin Time: 56 s (ref 9.6–13.1)

## 2018-03-24 LAB — LDL CHOLESTEROL, DIRECT: Direct LDL: 43 mg/dL

## 2018-03-24 LAB — HEMOGLOBIN A1C: Hgb A1c MFr Bld: 7 % — ABNORMAL HIGH (ref 4.6–6.5)

## 2018-03-24 LAB — C-REACTIVE PROTEIN: CRP: 0.1 mg/dL — ABNORMAL LOW (ref 0.5–20.0)

## 2018-03-24 LAB — SEDIMENTATION RATE: Sed Rate: 16 mm/hr (ref 0–20)

## 2018-03-24 NOTE — Progress Notes (Signed)
Patient ID: Stephen Kelly, male    DOB: 14-Apr-1941  Age: 77 y.o. MRN: [id number]  The patient is here for annual preventive  examination and management of other chronic and acute problems.   The risk factors are reflected in the social history.  The roster of all physicians providing medical care to patient - is listed in the Snapshot section of the chart.  Activities of daily living:  The patient is 100% independent in all ADLs: dressing, toileting, feeding as well as independent mobility  Home safety : The patient has smoke detectors in the home. They wear seatbelts.  There are no firearms at home. There is no violence in the home.   There is no risks for hepatitis, STDs or HIV. There is no   history of blood transfusion. They have no travel history to infectious disease endemic areas of the world.  The patient has seen their dentist in the last six month. They have seen their eye doctor in the last year. They admit to slight hearing difficulty with regard to whispered voices and some television programs.  They have deferred audiologic testing in the last year.  They do not  have excessive sun exposure. Discussed the need for sun protection: hats, long sleeves and use of sunscreen if there is significant sun exposure.   Diet: the importance of a healthy diet is discussed. They do have a healthy diet.  The benefits of regular aerobic exercise were discussed. 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    Depression screen: there are no signs or vegative symptoms of depression- irritability, change in appetite, anhedonia, sadness/tearfullness.  Cognitive assessment: the patient manages all their financial and personal affairs and is actively engaged. They could relate day,date,year and events; recalled 2/3 objects at 3 minutes; performed clock-face test normally.  The following portions of the patient's history were reviewed and updated as appropriate: allergies, current  medications, past family history, past medical history,  past surgical history, past social history  and problem list.  Visual acuity was not assessed per patient preference since she has regular follow up with her ophthalmologist. Hearing and body mass index were assessed and reviewed.   During the course of the visit the patient was educated and counseled about appropriate screening and preventive services including : fall prevention , diabetes screening, nutrition counseling, colorectal cancer screening, and recommended immunizations.    CC: The primary encounter diagnosis was Snoring. Diagnoses of Cataract cortical, senile, bilateral, Uncontrolled morning headache, Fatigue, unspecified type, Nonintractable episodic headache, unspecified headache type, Anticoagulation monitoring, INR range 2-3, Mixed hyperlipidemia, Polyarthritis of multiple sites, Type 2 diabetes mellitus with microalbuminuria, without long-term current use of insulin (HCC), Encounter for immunization, Vertigo of central origin, and Hyperlipidemia associated with type 2 diabetes mellitus (HCC) were also pertinent to this visit.    Seen 3 weeks ago for initial presentation of vertigo with N/V ,  Flu test was weakly positive and he was treated. He continues to have dizzy spells,  Less frequent , but occurring  with sitting up after waking ,.  Also happens when he bends over.  Has also had 2 episodes of nausea with vomiting.  1 occurred at bedtime after lying down,  the other unclear. .  Waking up  Some mornings  with a splitting headache  And extreme fatigue.   Has a history of OSA which resolved with 35 lb weight loss.  No other  neurologic symptoms.  Has not fallen  Home bps when not dizzy have been checked and 120 to 140/74 to 86  History Jatinder has a past medical history of Clotting disorder (HCC) (2011), Gout, arthritis, History of renal calculi (2001), Hyperlipidemia, Hypertension, 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HCC),  Patent foramen ovale, and Pulmonary nodule, right (2011).   He has a past surgical history that includes Thrombectomy.   His family history includes Cancer in his mother; Diabetes in his mother; Heart disease in his mother; Hypertension in his mother; Mental illness in his father; Stroke in his paternal grandmother.He reports that he has never smoked. He has never used smokeless tobacco. He reports that he does not drink alcohol or use drugs.  Outpatient Medications Prior to Visit  Medication Sig Dispense Refill  . allopurinol (ZYLOPRIM) 300 MG tablet take 1 tablet by mouth once daily 90 tablet 1  . atorvastatin (LIPITOR) 20 MG tablet take 1 tablet by mouth once daily 90 tablet 1  . glipiZIDE (GLUCOTROL XL) 2.5 MG 24 hr tablet take 1 tablet by mouth every morning with BREAKFAST 90 tablet 1  . hydrochlorothiazide (HYDRODIURIL) 25 MG tablet take 1 tablet by mouth once daily 90 tablet 1  . losartan (COZAAR) 100 MG tablet TAKE 1 TABLET BY MOUTH EVERY DAY 90 tablet 0  . metFORMIN (GLUCOPHAGE) 850 MG tablet take 1 tablet by mouth twice a day with food 180 tablet 1  . warfarin (COUMADIN) 3 MG tablet TAKE 1 TABLET BY MOUTH ONCE DAILY 90 tablet 0  . fluocinonide-emollient (LIDEX-E) 0.05 % cream Apply 1 application topically 2 (two) times daily. (Patient not taking: Reported on 03/24/2018) 60 g 2  . guaiFENesin-codeine (CHERATUSSIN AC) 100-10 MG/5ML syrup Take 5 mLs by mouth 3 (three) times daily as needed for cough. (Patient not taking: Reported on 03/01/2018) 180 mL 0  . ondansetron (ZOFRAN ODT) 4 MG disintegrating tablet Take 1 tablet (4 mg total) by mouth every 8 (eight) hours as needed for nausea or vomiting. (Patient not taking: Reported on 03/24/2018) 20 tablet 0  . oseltamivir (TAMIFLU) 75 MG capsule Take 1 capsule (75 mg total) by mouth 2 (two) times daily. (Patient not taking: Reported on 03/24/2018) 10 capsule 0  . predniSONE (DELTASONE) 10 MG tablet 6 tablets on Day 1 , then reduce by 1 tablet  daily until gone (Patient not taking: Reported on 03/24/2018) 21 tablet 0  . SHINGRIX injection   0   No facility-administered medications prior to visit.     Review of Systems  Patient denies headache, fevers, malaise, unintentional weight loss, skin rash, eye pain, sinus congestion and sinus pain, sore throat, dysphagia,  hemoptysis , cough, dyspnea, wheezing, chest pain, palpitations, orthopnea, edema, abdominal pain, nausea, melena, diarrhea, constipation, flank pain, dysuria, hematuria, urinary  Frequency, nocturia, numbness, tingling, seizures,  Focal weakness, Loss of consciousness,  Tremor, insomnia, depression, anxiety, and suicidal ideation.     Objective:  BP 120/78 (BP Location: Left Arm, Patient Position: Sitting, Cuff Size: Normal)   Pulse 90   Temp 97.8 F (36.6 C) (Oral)   Resp 16   Ht 5\' 9"  (1.753 m)   Wt 197 lb 3.2 oz (89.4 kg)   SpO2 97%   BMI 29.12 kg/m   Physical Exam   General appearance: alert, cooperative and appears stated age Ears: normal TM's and external ear canals both ears Throat: lips, mucosa, and tongue normal; teeth and gums normal Neck: no adenopathy, no carotid bruit, supple, symmetrical, trachea midline and thyroid not enlarged, symmetric, no tenderness/mass/nodules Back: symmetric,  no curvature. ROM normal. No CVA tenderness. Lungs: clear to auscultation bilaterally Heart: regular rate and rhythm, S1, S2 normal, Grade 2 systolic murmur, click, rub or gallop Abdomen: soft, non-tender; bowel sounds normal; no masses,  no organomegaly Pulses: 2+ and symmetric Skin: Skin color, texture, turgor normal. No rashes or lesions Lymph nodes: Cervical, supraclavicular, and axillary nodes normal.   Assessment & Plan:   Problem List Items Addressed This Visit    Anticoagulation monitoring, INR range 2-3   Relevant Orders   Protime-INR (Completed)   Cataract cortical, senile, bilateral   Fatigue   Relevant Orders   Home sleep test   CBC with  Differential/Platelet (Completed)   Hyperlipidemia   Relevant Orders   Lipid panel (Completed)   Hyperlipidemia associated with type 2 diabetes mellitus (HCC)    LDL is  < 70  with atorvastatin . LFTs are normal and he is tolerating therapy.   no changes today,   Lab Results  Component Value Date   CHOL 101 03/24/2018   HDL 31.70 (L) 03/24/2018   LDLCALC 17 03/17/2017   LDLDIRECT 43.0 03/24/2018   TRIG 243.0 (H) 03/24/2018   CHOLHDL 3 03/24/2018   Lab Results  Component Value Date   ALT 12 03/24/2018   AST 13 03/24/2018   ALKPHOS 46 03/24/2018   BILITOT 0.5 03/24/2018             New onset headache   Polyarthritis of multiple sites   Type 2 diabetes mellitus with microalbuminuria (HCC)    Improved control since adding Januvia to metformin 850 mg bid.   No changes today   Patient is up-to-date on eye exams and foot exam is normal today. Patient has no microalbuminuria. Patient is tolerating statin therapy for CAD risk reduction and on ACE/ARB for  microalbuminuria and  hypertension   Lab Results  Component Value Date   HGBA1C 7.0 (H) 03/24/2018   Lab Results  Component Value Date   MICROALBUR 6.6 (H) 03/24/2018         Relevant Orders   Hemoglobin A1c (Completed)   Microalbumin / creatinine urine ratio (Completed)   Comprehensive metabolic panel (Completed)   Vertigo of central origin    With recurrent headaches. He takes coumadin chronically  For history of radial artery thrombosis and has a PFO>.  MRI brain ordered to rule out bleed., mass and CVA        Other Visit Diagnoses    Snoring    -  Primary   Relevant Orders   Home sleep test   Nonintractable episodic headache, unspecified headache type       Relevant Orders   MR Brain W Wo Contrast   Sedimentation rate (Completed)   C-reactive protein (Completed)   Encounter for immunization       Relevant Orders   Flu vaccine HIGH DOSE PF (Completed)    A total of 40 minutes was spent with patient more  than half of which was spent in counseling patient on the above mentioned issues , reviewing and explaining recent labs and imaging studies done, and coordination of care.   I have discontinued Pamelia Hoit B. Andon "[name]"'s SHINGRIX, oseltamivir, and predniSONE. I am also having him maintain his fluocinonide-emollient, guaiFENesin-codeine, atorvastatin, allopurinol, metFORMIN, hydrochlorothiazide, glipiZIDE, warfarin, ondansetron, and losartan.  No orders of the defined types were placed in this encounter.   Medications Discontinued During This Encounter  Medication Reason  . oseltamivir (TAMIFLU) 75 MG capsule Completed Course  . predniSONE (DELTASONE)  10 MG tablet Completed Course  . SHINGRIX injection Completed Course    Follow-up: No follow-ups on file.   Sherlene Shams, MD

## 2018-03-24 NOTE — Patient Instructions (Addendum)
I am ordering an MRI of your brain,  And a home sleep study   To see if your sleep apnea has returned.  Take your blood pressure when you are dizzy.    Preventive Care 76 Years and Older, Male Preventive care refers to lifestyle choices and visits with your health care provider that can promote health and wellness. What does preventive care include?  A yearly physical exam. This is also called an annual well check.  Dental exams once or twice a year.  Routine eye exams. Ask your health care provider how often you should have your eyes checked.  Personal lifestyle choices, including: ? Daily care of your teeth and gums. ? Regular physical activity. ? Eating a healthy diet. ? Avoiding tobacco and drug use. ? Limiting alcohol use. ? Practicing safe sex. ? Taking low doses of aspirin every day. ? Taking vitamin and mineral supplements as recommended by your health care provider. What happens during an annual well check? The services and screenings done by your health care provider during your annual well check will depend on your age, overall health, lifestyle risk factors, and family history of disease. Counseling Your health care provider may ask you questions about your:  Alcohol use.  Tobacco use.  Drug use.  Emotional well-being.  Home and relationship well-being.  Sexual activity.  Eating habits.  History of falls.  Memory and ability to understand (cognition).  Work and work Statistician.  Screening You may have the following tests or measurements:  Height, weight, and BMI.  Blood pressure.  Lipid and cholesterol levels. These may be checked every 5 years, or more frequently if you are over 50 years old.  Skin check.  Lung cancer screening. You may have this screening every year starting at age 59 if you have a 30-pack-year history of smoking and currently smoke or have quit within the past 15 years.  Fecal occult blood test (FOBT) of the stool. You  may have this test every year starting at age 18.  Flexible sigmoidoscopy or colonoscopy. You may have a sigmoidoscopy every 5 years or a colonoscopy every 10 years starting at age 45.  Prostate cancer screening. Recommendations will vary depending on your family history and other risks.  Hepatitis C blood test.  Hepatitis B blood test.  Sexually transmitted disease (STD) testing.  Diabetes screening. This is done by checking your blood sugar (glucose) after you have not eaten for a while (fasting). You may have this done every 1-3 years.  Abdominal aortic aneurysm (AAA) screening. You may need this if you are a current or former smoker.  Osteoporosis. You may be screened starting at age 6 if you are at high risk.  Talk with your health care provider about your test results, treatment options, and if necessary, the need for more tests. Vaccines Your health care provider may recommend certain vaccines, such as:  Influenza vaccine. This is recommended every year.  Tetanus, diphtheria, and acellular pertussis (Tdap, Td) vaccine. You may need a Td booster every 10 years.  Varicella vaccine. You may need this if you have not been vaccinated.  Zoster vaccine. You may need this after age 59.  Measles, mumps, and rubella (MMR) vaccine. You may need at least one dose of MMR if you were born in 1957 or later. You may also need a second dose.  Pneumococcal 13-valent conjugate (PCV13) vaccine. One dose is recommended after age 41.  Pneumococcal polysaccharide (PPSV23) vaccine. One dose is recommended after age  65.  Meningococcal vaccine. You may need this if you have certain conditions.  Hepatitis A vaccine. You may need this if you have certain conditions or if you travel or work in places where you may be exposed to hepatitis A.  Hepatitis B vaccine. You may need this if you have certain conditions or if you travel or work in places where you may be exposed to hepatitis  B.  Haemophilus influenzae type b (Hib) vaccine. You may need this if you have certain risk factors.  Talk to your health care provider about which screenings and vaccines you need and how often you need them. This information is not intended to replace advice given to you by your health care provider. Make sure you discuss any questions you have with your health care provider. Document Released: 06/07/2015 Document Revised: 01/29/2016 Document Reviewed: 03/12/2015 Elsevier Interactive Patient Education  [employer].

## 2018-03-24 NOTE — Telephone Encounter (Signed)
Critical Lab:  PT 56.0 reported by [facility]

## 2018-03-24 NOTE — Telephone Encounter (Signed)
Called patient & advise to not take coumadin for next 3 days and we will recheck INR on Monday  PT/INR is elevated out of range.  Monitor self for any bleeding, Chest pain, SOB, ABD pain - if these develop go to ER right away  I will also run this by Dr Darrick Huntsman tomorrow

## 2018-03-24 NOTE — Telephone Encounter (Signed)
[facility] called with pt critical lab. PCP not in office

## 2018-03-25 NOTE — Telephone Encounter (Signed)
Spoke with pt and he is aware that he is to not take any Coumadin this weekend and then come back in on Monday morning for a lab draw. Appt has been scheduled and pt is aware of appt date and time.

## 2018-03-25 NOTE — Telephone Encounter (Signed)
Please let patient know: Confirmed with Dr Darrick Huntsman -- Hold coumadin over the weekend & schedule for repeat INR on monday

## 2018-03-25 NOTE — Addendum Note (Signed)
Addended by: Leanora Cover on: 03/25/2018 09:00 AM   Modules accepted: Orders

## 2018-03-26 DIAGNOSIS — H814 Vertigo of central origin: Secondary | ICD-10-CM | POA: Insufficient documentation

## 2018-03-26 NOTE — Assessment & Plan Note (Signed)
Improved control since adding Januvia to metformin 850 mg bid.   No changes today   Patient is up-to-date on eye exams and foot exam is normal today. Patient has no microalbuminuria. Patient is tolerating statin therapy for CAD risk reduction and on ACE/ARB for  microalbuminuria and  hypertension   Lab Results  Component Value Date   HGBA1C 7.0 (H) 03/24/2018   Lab Results  Component Value Date   MICROALBUR 6.6 (H) 03/24/2018

## 2018-03-26 NOTE — Assessment & Plan Note (Signed)
LDL is  < 70  with atorvastatin . LFTs are normal and he is tolerating therapy.   no changes today,   Lab Results  Component Value Date   CHOL 101 03/24/2018   HDL 31.70 (L) 03/24/2018   LDLCALC 17 03/17/2017   LDLDIRECT 43.0 03/24/2018   TRIG 243.0 (H) 03/24/2018   CHOLHDL 3 03/24/2018   Lab Results  Component Value Date   ALT 12 03/24/2018   AST 13 03/24/2018   ALKPHOS 46 03/24/2018   BILITOT 0.5 03/24/2018        

## 2018-03-26 NOTE — Assessment & Plan Note (Addendum)
With recurrent headaches. He takes coumadin chronically  For history of radial artery thrombosis and has a PFO>.  MRI brain ordered to rule out bleed., mass and CVA

## 2018-03-28 ENCOUNTER — Other Ambulatory Visit: Payer: Self-pay | Admitting: Internal Medicine

## 2018-03-28 ENCOUNTER — Other Ambulatory Visit (INDEPENDENT_AMBULATORY_CARE_PROVIDER_SITE_OTHER): Payer: Medicare Other

## 2018-03-28 DIAGNOSIS — Z7901 Long term (current) use of anticoagulants: Secondary | ICD-10-CM | POA: Diagnosis not present

## 2018-03-28 LAB — PROTIME-INR
INR: 1.5 ratio — ABNORMAL HIGH (ref 0.8–1.0)
Prothrombin Time: 17.2 s — ABNORMAL HIGH (ref 9.6–13.1)

## 2018-03-31 ENCOUNTER — Other Ambulatory Visit: Payer: Self-pay | Admitting: Internal Medicine

## 2018-03-31 NOTE — Telephone Encounter (Signed)
Last OV   Coumadin last refilled 01/19/2018 disp 90 with no refills   Atorvastatin last refilled 09/29/2017 disp 90 with 1 refill   Sent to PCP for approval

## 2018-03-31 NOTE — Telephone Encounter (Signed)
please confirm that he has resumed 3 mg daily  of coumadin as of Monday and is returning for inr around 11/18.  coumaidn refilled

## 2018-03-31 NOTE — Telephone Encounter (Signed)
Pt is taking 3mg  daily and is scheduled for lab appt on 04/11/2018.

## 2018-04-11 ENCOUNTER — Other Ambulatory Visit (INDEPENDENT_AMBULATORY_CARE_PROVIDER_SITE_OTHER): Payer: Medicare Other

## 2018-04-11 DIAGNOSIS — Z7901 Long term (current) use of anticoagulants: Secondary | ICD-10-CM

## 2018-04-11 LAB — PROTIME-INR
INR: 2 ratio — ABNORMAL HIGH (ref 0.8–1.0)
Prothrombin Time: 23.6 s — ABNORMAL HIGH (ref 9.6–13.1)

## 2018-04-15 ENCOUNTER — Telehealth: Payer: Self-pay

## 2018-04-15 ENCOUNTER — Ambulatory Visit
Admission: RE | Admit: 2018-04-15 | Discharge: 2018-04-15 | Disposition: A | Discharge status: Home / Self Care | Payer: Medicare Other | Source: Ambulatory Visit | Attending: Internal Medicine | Admitting: Internal Medicine

## 2018-04-15 DIAGNOSIS — R51 Headache: Secondary | ICD-10-CM | POA: Insufficient documentation

## 2018-04-15 DIAGNOSIS — I6782 Cerebral ischemia: Secondary | ICD-10-CM | POA: Diagnosis not present

## 2018-04-15 DIAGNOSIS — Z7901 Long term (current) use of anticoagulants: Secondary | ICD-10-CM

## 2018-04-15 DIAGNOSIS — I639 Cerebral infarction, unspecified: Secondary | ICD-10-CM | POA: Diagnosis not present

## 2018-04-15 DIAGNOSIS — R519 Headache, unspecified: Secondary | ICD-10-CM

## 2018-04-15 LAB — POCT I-STAT CREATININE: Creatinine, Ser: 1.4 mg/dL — ABNORMAL HIGH (ref 0.61–1.24)

## 2018-04-15 MED ORDER — GADOBUTROL 1 MMOL/ML IV SOLN
8.0000 mL | Freq: Once | INTRAVENOUS | Status: AC | PRN
  Administered 2018-04-15: 8 mL via INTRAVENOUS

## 2018-04-15 NOTE — Telephone Encounter (Signed)
Lab has been ordered.

## 2018-04-17 ENCOUNTER — Other Ambulatory Visit: Payer: Self-pay | Admitting: Internal Medicine

## 2018-04-27 ENCOUNTER — Telehealth: Payer: Self-pay | Admitting: Internal Medicine

## 2018-04-27 DIAGNOSIS — G4733 Obstructive sleep apnea (adult) (pediatric): Secondary | ICD-10-CM

## 2018-04-27 NOTE — Telephone Encounter (Signed)
Spoke with pt and informed him of his sleep study results. Pt gave a verbal understanding.

## 2018-04-27 NOTE — Telephone Encounter (Signed)
Sleep apnea has been confirmed with home sleep study (2 night) AHI 29.4 on 11/21 and 37.0 on 11/22 . Auto adjusting CPAP, full face mask has been  ordered Apr 26 2018

## 2018-05-09 DIAGNOSIS — G4733 Obstructive sleep apnea (adult) (pediatric): Secondary | ICD-10-CM | POA: Diagnosis not present

## 2018-05-11 ENCOUNTER — Telehealth: Payer: Self-pay | Admitting: Internal Medicine

## 2018-05-11 NOTE — Telephone Encounter (Signed)
Copied from CRM [id number]. Topic: General - Other >> May 11, 2018 12:06 PM Leafy Roobinson, Norma J wrote: Reason for CRM: Rachell Ciproashley [employer] medicare is calling the cpap machine has been approved  for 2 month rental and then patient will send compliance down load and at that time he maybe able to purchase

## 2018-05-11 NOTE — Telephone Encounter (Signed)
FYI

## 2018-05-16 ENCOUNTER — Other Ambulatory Visit (INDEPENDENT_AMBULATORY_CARE_PROVIDER_SITE_OTHER): Payer: Medicare Other

## 2018-05-16 DIAGNOSIS — Z7901 Long term (current) use of anticoagulants: Secondary | ICD-10-CM

## 2018-05-16 LAB — PROTIME-INR
INR: 2.5 ratio — ABNORMAL HIGH (ref 0.8–1.0)
Prothrombin Time: 28.6 s — ABNORMAL HIGH (ref 9.6–13.1)

## 2018-06-09 DIAGNOSIS — G4733 Obstructive sleep apnea (adult) (pediatric): Secondary | ICD-10-CM | POA: Diagnosis not present

## 2018-06-13 ENCOUNTER — Other Ambulatory Visit: Payer: Self-pay | Admitting: Internal Medicine

## 2018-06-20 ENCOUNTER — Other Ambulatory Visit (INDEPENDENT_AMBULATORY_CARE_PROVIDER_SITE_OTHER): Payer: Medicare Other

## 2018-06-20 DIAGNOSIS — Z7901 Long term (current) use of anticoagulants: Secondary | ICD-10-CM

## 2018-06-20 DIAGNOSIS — Z5181 Encounter for therapeutic drug level monitoring: Secondary | ICD-10-CM | POA: Diagnosis not present

## 2018-06-20 LAB — PROTIME-INR
INR: 2.4 ratio — ABNORMAL HIGH (ref 0.8–1.0)
Prothrombin Time: 27.5 s — ABNORMAL HIGH (ref 9.6–13.1)

## 2018-07-05 ENCOUNTER — Other Ambulatory Visit: Payer: Self-pay | Admitting: Internal Medicine

## 2018-07-05 ENCOUNTER — Telehealth: Payer: Self-pay | Admitting: Internal Medicine

## 2018-07-05 NOTE — Telephone Encounter (Signed)
Pt dropped off paper work from [employer] about his CPAP machine. They are denying the CPAP. Papers are up front in color folder for Dr. Darrick Huntsman to review.

## 2018-07-06 ENCOUNTER — Other Ambulatory Visit: Payer: Self-pay | Admitting: Internal Medicine

## 2018-07-10 DIAGNOSIS — G4733 Obstructive sleep apnea (adult) (pediatric): Secondary | ICD-10-CM | POA: Diagnosis not present

## 2018-07-11 NOTE — Telephone Encounter (Signed)
Called [employer] to see if they could tell us why the pt had received a letter stating that his insurance had denied coverage of his CPAP machine. When I spoke with Fayrene Fearing at [employer] he gave me a number to the main office in [place], I called them and they stated that they would reach out to the pt. Called the pt to let the pt know that they would be giving him a call.

## 2018-07-13 ENCOUNTER — Other Ambulatory Visit: Payer: Self-pay | Admitting: Internal Medicine

## 2018-07-25 ENCOUNTER — Other Ambulatory Visit: Payer: Medicare Other

## 2018-09-09 ENCOUNTER — Other Ambulatory Visit: Payer: Self-pay

## 2018-09-09 MED ORDER — LOSARTAN POTASSIUM 100 MG PO TABS: 100.0000 mg | ORAL_TABLET | Freq: Every day | ORAL | 1 refills | Status: DC

## 2018-10-04 ENCOUNTER — Other Ambulatory Visit: Payer: Self-pay | Admitting: Internal Medicine

## 2018-10-07 NOTE — Telephone Encounter (Signed)
error 

## 2018-10-19 ENCOUNTER — Other Ambulatory Visit: Payer: Self-pay

## 2018-10-19 MED ORDER — HYDROCHLOROTHIAZIDE 25 MG PO TABS: 25.0000 mg | ORAL_TABLET | Freq: Every day | ORAL | 1 refills | Status: DC

## 2018-10-19 MED ORDER — ALLOPURINOL 300 MG PO TABS: 300.0000 mg | ORAL_TABLET | Freq: Every day | ORAL | 1 refills | Status: DC

## 2018-10-19 MED ORDER — METFORMIN HCL 850 MG PO TABS: 850.0000 mg | ORAL_TABLET | Freq: Two times a day (BID) | ORAL | 1 refills | Status: DC

## 2018-12-26 ENCOUNTER — Other Ambulatory Visit: Payer: Self-pay

## 2018-12-26 MED ORDER — GLIPIZIDE ER 2.5 MG PO TB24: 2.5000 mg | ORAL_TABLET | Freq: Every day | ORAL | 1 refills | Status: DC

## 2018-12-26 MED ORDER — ATORVASTATIN CALCIUM 20 MG PO TABS: 20.0000 mg | ORAL_TABLET | Freq: Every day | ORAL | 1 refills | Status: DC

## 2018-12-26 MED ORDER — LOSARTAN POTASSIUM 100 MG PO TABS: 100.0000 mg | ORAL_TABLET | Freq: Every day | ORAL | 1 refills | Status: DC

## 2018-12-26 MED ORDER — WARFARIN SODIUM 3 MG PO TABS: 3.0000 mg | ORAL_TABLET | Freq: Every day | ORAL | 0 refills | Status: DC

## 2019-01-13 ENCOUNTER — Telehealth: Payer: Self-pay | Admitting: *Deleted

## 2019-01-13 NOTE — Telephone Encounter (Signed)
Please place future orders for lab appt.  

## 2019-01-14 ENCOUNTER — Other Ambulatory Visit: Payer: Self-pay | Admitting: Internal Medicine

## 2019-01-14 DIAGNOSIS — E1169 Type 2 diabetes mellitus with other specified complication: Secondary | ICD-10-CM

## 2019-01-14 DIAGNOSIS — E782 Mixed hyperlipidemia: Secondary | ICD-10-CM

## 2019-01-14 DIAGNOSIS — E1129 Type 2 diabetes mellitus with other diabetic kidney complication: Secondary | ICD-10-CM

## 2019-01-14 DIAGNOSIS — Z7901 Long term (current) use of anticoagulants: Secondary | ICD-10-CM

## 2019-01-14 DIAGNOSIS — M109 Gout, unspecified: Secondary | ICD-10-CM

## 2019-01-14 DIAGNOSIS — I10 Essential (primary) hypertension: Secondary | ICD-10-CM

## 2019-01-14 DIAGNOSIS — R809 Proteinuria, unspecified: Secondary | ICD-10-CM

## 2019-01-14 NOTE — Telephone Encounter (Signed)
Stephen Kelly has not has his INR checked since February and his A1c since last November and he is DIABETIC. [name]  He NEEDS to h an INR monthly and and a1c every 6 months He has a lab appt coming up and it will be checked,  But he needs an OV scheduled after the labs.

## 2019-01-17 ENCOUNTER — Other Ambulatory Visit: Payer: Self-pay

## 2019-01-17 ENCOUNTER — Other Ambulatory Visit: Payer: Medicare Other

## 2019-01-17 ENCOUNTER — Emergency Department: Payer: Medicare Other

## 2019-01-17 ENCOUNTER — Ambulatory Visit: Payer: Self-pay | Admitting: *Deleted

## 2019-01-17 ENCOUNTER — Emergency Department
Admission: EM | Admit: 2019-01-17 | Discharge: 2019-01-17 | Disposition: A | Discharge status: Home / Self Care | Payer: Medicare Other | Attending: Emergency Medicine | Admitting: Emergency Medicine

## 2019-01-17 ENCOUNTER — Encounter: Payer: Self-pay | Admitting: Emergency Medicine

## 2019-01-17 DIAGNOSIS — Y92003 Bedroom of unspecified non-institutional (private) residence as the place of occurrence of the external cause: Secondary | ICD-10-CM | POA: Diagnosis not present

## 2019-01-17 DIAGNOSIS — W01118A Fall on same level from slipping, tripping and stumbling with subsequent striking against other sharp object, initial encounter: Secondary | ICD-10-CM | POA: Diagnosis not present

## 2019-01-17 DIAGNOSIS — S199XXA Unspecified injury of neck, initial encounter: Secondary | ICD-10-CM | POA: Diagnosis not present

## 2019-01-17 DIAGNOSIS — S0083XA Contusion of other part of head, initial encounter: Secondary | ICD-10-CM | POA: Diagnosis not present

## 2019-01-17 DIAGNOSIS — Z7984 Long term (current) use of oral hypoglycemic drugs: Secondary | ICD-10-CM | POA: Diagnosis not present

## 2019-01-17 DIAGNOSIS — Y9389 Activity, other specified: Secondary | ICD-10-CM | POA: Diagnosis not present

## 2019-01-17 DIAGNOSIS — R0602 Shortness of breath: Secondary | ICD-10-CM | POA: Diagnosis not present

## 2019-01-17 DIAGNOSIS — E119 Type 2 diabetes mellitus without complications: Secondary | ICD-10-CM | POA: Insufficient documentation

## 2019-01-17 DIAGNOSIS — Z79899 Other long term (current) drug therapy: Secondary | ICD-10-CM | POA: Insufficient documentation

## 2019-01-17 DIAGNOSIS — Y998 Other external cause status: Secondary | ICD-10-CM | POA: Insufficient documentation

## 2019-01-17 DIAGNOSIS — R42 Dizziness and giddiness: Secondary | ICD-10-CM

## 2019-01-17 DIAGNOSIS — I10 Essential (primary) hypertension: Secondary | ICD-10-CM | POA: Diagnosis not present

## 2019-01-17 DIAGNOSIS — Z7901 Long term (current) use of anticoagulants: Secondary | ICD-10-CM | POA: Diagnosis not present

## 2019-01-17 DIAGNOSIS — S79911A Unspecified injury of right hip, initial encounter: Secondary | ICD-10-CM | POA: Diagnosis not present

## 2019-01-17 DIAGNOSIS — M5136 Other intervertebral disc degeneration, lumbar region: Secondary | ICD-10-CM | POA: Diagnosis not present

## 2019-01-17 DIAGNOSIS — S0990XA Unspecified injury of head, initial encounter: Secondary | ICD-10-CM | POA: Diagnosis not present

## 2019-01-17 DIAGNOSIS — W19XXXA Unspecified fall, initial encounter: Secondary | ICD-10-CM

## 2019-01-17 LAB — CBC WITH DIFFERENTIAL/PLATELET
Abs Immature Granulocytes: 0.04 10*3/uL (ref 0.00–0.07)
Basophils Absolute: 0.1 10*3/uL (ref 0.0–0.1)
Basophils Relative: 0 %
Eosinophils Absolute: 0.2 10*3/uL (ref 0.0–0.5)
Eosinophils Relative: 2 %
HCT: 38.3 % — ABNORMAL LOW (ref 39.0–52.0)
Hemoglobin: 13.2 g/dL (ref 13.0–17.0)
Immature Granulocytes: 0 %
Lymphocytes Relative: 15 %
Lymphs Abs: 1.8 10*3/uL (ref 0.7–4.0)
MCH: 31.1 pg (ref 26.0–34.0)
MCHC: 34.5 g/dL (ref 30.0–36.0)
MCV: 90.3 fL (ref 80.0–100.0)
Monocytes Absolute: 0.7 10*3/uL (ref 0.1–1.0)
Monocytes Relative: 6 %
Neutro Abs: 8.7 10*3/uL — ABNORMAL HIGH (ref 1.7–7.7)
Neutrophils Relative %: 77 %
Platelets: 283 10*3/uL (ref 150–400)
RBC: 4.24 MIL/uL (ref 4.22–5.81)
RDW: 13.8 % (ref 11.5–15.5)
WBC: 11.4 10*3/uL — ABNORMAL HIGH (ref 4.0–10.5)
nRBC: 0 % (ref 0.0–0.2)

## 2019-01-17 LAB — PROTIME-INR
INR: 2.4 — ABNORMAL HIGH (ref 0.8–1.2)
Prothrombin Time: 25.6 seconds — ABNORMAL HIGH (ref 11.4–15.2)

## 2019-01-17 LAB — BASIC METABOLIC PANEL
Anion gap: 10 (ref 5–15)
BUN: 31 mg/dL — ABNORMAL HIGH (ref 8–23)
CO2: 25 mmol/L (ref 22–32)
Calcium: 9.8 mg/dL (ref 8.9–10.3)
Chloride: 104 mmol/L (ref 98–111)
Creatinine, Ser: 1.89 mg/dL — ABNORMAL HIGH (ref 0.61–1.24)
GFR calc Af Amer: 39 mL/min — ABNORMAL LOW (ref >60)
GFR calc non Af Amer: 33 mL/min — ABNORMAL LOW (ref >60)
Glucose, Bld: 152 mg/dL — ABNORMAL HIGH (ref 70–99)
Potassium: 4.1 mmol/L (ref 3.5–5.1)
Sodium: 139 mmol/L (ref 135–145)

## 2019-01-17 LAB — TROPONIN I (HIGH SENSITIVITY)
Troponin I (High Sensitivity): 10 ng/L (ref <18)
Troponin I (High Sensitivity): 9 ng/L (ref <18)

## 2019-01-17 MED ORDER — LACTATED RINGERS IV BOLUS
500.0000 mL | Freq: Once | INTRAVENOUS | Status: AC
  Administered 2019-01-17: 10:00:00 500 mL via INTRAVENOUS

## 2019-01-17 NOTE — Telephone Encounter (Signed)
Patient was transferred to office after fall at 0600,   patient  said his head is bleeding and he cut his head in the fall , he stated hip and back was a 9 out of a scale of 0-10 on pain scale, patient on coumadin advised patient he needs evaluation immediately due to fall an could have bleeding inside, also due to no INR recorded in chart since 06/20/18 and has had HX of out range INR in the last 9 months. patient was advised not to drive and that he should go to UC or ER immediately and to have some one else drive . Patient stated he would go to ER and have some one drive him. Advised patient would monitor chart for his arrival at the ER.

## 2019-01-17 NOTE — Telephone Encounter (Signed)
Called and spoke to patient.  Confirmed that someone was with patient.  Patient said that his wife was with him in the home.    Recommended patient be evaluated in person at ED.  Patient stated that he didn't want to go to the ED because he would be there all day.  Asked pt if he would consider going to UC.  Patient asked if he could come into office to see Dr. Derrel Nip since he has a lab appt in office today at 9:45.  Pt aware that Dr. Derrel Nip is out of the office until this afternoon.  Again recommended that patient go to UC given his fall, head injury, pain and since he was on coumadin.    Consulted with clinic lead nurse and transferred call to lead nurse to speak with patient.

## 2019-01-17 NOTE — ED Notes (Signed)
Patient transported to CT 

## 2019-01-17 NOTE — ED Notes (Addendum)
Pt returned from xray pale/gray and diaphoretic. Pt c/o nausea. Hooked pt up and did repeat EKG.  Pt sats 88-89% RA and previous were WNL. When asked pt does admit to [facility] when in xray.  Informed dr Charna Archer of this along with the abrasions seen over right scapula/upper during previous assessment.  New orders received.

## 2019-01-17 NOTE — Telephone Encounter (Signed)
THANK YOU

## 2019-01-17 NOTE — ED Notes (Signed)
Pt color improved. When returned from CXR O2 turned off to watch sats on room air.

## 2019-01-17 NOTE — ED Triage Notes (Signed)
Pt here with c/o fall this am, woke up to walk around the bed, felt "my legs walking backwards," then fell back, denies dizziness being the cause. c/o right lower back pain and right hip pain, small lac noted to the back right of his head which he hit on the dresser drawers, bleeding controlled, small knot noted. Pt denies LOC. Denies any dizziness at this time.

## 2019-01-17 NOTE — ED Provider Notes (Signed)
[facility] Emergency Department Provider Note   ____________________________________________   First MD Initiated Contact with Patient 01/17/19 [phone]     (approximate)  I have reviewed the triage vital signs and the nursing notes.   HISTORY  Chief Complaint Fall and Head Injury    HPI Stephen Kelly is a 78 y.o. male with past medical history of lupus anticoagulant and diabetes who presents to the ED following fall.  Patient reports that he stood up from the bed and was walking in his bedroom when "my legs started walking backwards".  He initially reported feeling dizzy, but later denied this.  States he does not think he lost his balance, but is unsure what it may have caused him to fall.  He fell backward onto his right hip and back.  He also struck his head at the lower part of a dresser.  He denies losing consciousness, but is anticoagulated with Coumadin.  He also complains of pain in his midline low back and along his right hip and buttock.  He denies any headache, neck pain, numbness, or weakness.  He also denies any chest pain, abdominal pain, upper extremity pain, or lower extremity pain.        Past Medical History:  Diagnosis Date  . Clotting disorder ([place]) 2011   Lupus Inhibitor positive by March 2012 studies  . Gout, arthritis    managed with allopurinol  . History of renal calculi 2001   history of lithotripsy ,  no stent  . Hyperlipidemia   . Hypertension   . Lupus anticoagulant inhibitor syndrome [facility])    per gitten's test March 2012  . Patent foramen ovale   . Pulmonary nodule, right 2011   unchanged, followed by gitten with serial ct    Patient Active Problem List   Diagnosis Date Noted  . Vertigo of central origin 03/26/2018  . Cataract cortical, senile, bilateral 03/24/2018  . New onset headache 03/24/2018  . Fatigue 03/24/2018  . Influenza A 03/03/2018  . Type 2 diabetes mellitus with microalbuminuria ([place]) 07/02/2017  .  Left knee pain 03/22/2017  . Erectile dysfunction 04/21/2016  . Anticoagulation monitoring, INR range 2-3 09/10/2014  . Encounter for preventive measure 03/13/2014  . Hyperlipidemia   . Polyarthritis of multiple sites 02/05/2014  . Long term current use of anticoagulant therapy 12/08/2013  . Hyperlipidemia associated with type 2 diabetes mellitus ([place]) 03/03/2012  . Nephropathy, diabetic ([place]) 08/31/2011  . Radial artery thrombosis, right 08/31/2011  . Actinic keratosis of left side of forehead 08/31/2011  . Patent foramen ovale   . 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[facility])   . Hypertension 04/23/2011  . Screening for colon cancer 04/22/2011  . OSA (obstructive sleep apnea) 04/22/2011  . Clotting disorder ([place])   . History of renal calculi   . Gout, arthritis   . Pulmonary nodule, right     Past Surgical History:  Procedure Laterality Date  . THROMBECTOMY     left brachicephalic artery    Prior to Admission medications   Medication Sig Start Date End Date Taking? Authorizing Provider  allopurinol (ZYLOPRIM) 300 MG tablet Take 1 tablet (300 mg total) by mouth daily. 10/19/18  Yes Crecencio Mc, MD  atorvastatin (LIPITOR) 20 MG tablet Take 1 tablet (20 mg total) by mouth daily. 12/26/18  Yes Crecencio Mc, MD  glipiZIDE (GLUCOTROL XL) 2.5 MG 24 hr tablet Take 1 tablet (2.5 mg total) by mouth daily with breakfast. 12/26/18  Yes Tullo,  Mar Daring, MD  hydrochlorothiazide (HYDRODIURIL) 25 MG tablet Take 1 tablet (25 mg total) by mouth daily. 10/19/18  Yes Sherlene Shams, MD  losartan (COZAAR) 100 MG tablet Take 1 tablet (100 mg total) by mouth daily. 12/26/18  Yes Sherlene Shams, MD  metFORMIN (GLUCOPHAGE) 850 MG tablet Take 1 tablet (850 mg total) by mouth 2 (two) times daily with a meal. 10/19/18  Yes Sherlene Shams, MD  warfarin (COUMADIN) 3 MG tablet Take 1 tablet (3 mg total) by mouth daily. 12/26/18  Yes Sherlene Shams, MD    Allergies Azithromycin  Family History  Problem  Relation Age of Onset  . Heart disease Mother   . Hypertension Mother   . Diabetes Mother   . Cancer Mother        ovarian  . Mental illness Father        suicide vs foul play  . Stroke Paternal Grandmother     Social History Social History   Tobacco Use  . Smoking status: Never Smoker  . Smokeless tobacco: Never Used  Substance Use Topics  . Alcohol use: No  . Drug use: No    Review of Systems  Constitutional: No fever/chills Eyes: No visual changes. ENT: No sore throat. Cardiovascular: Denies chest pain. Respiratory: Denies shortness of breath. Gastrointestinal: No abdominal pain.  No nausea, no vomiting.  No diarrhea.  No constipation. Genitourinary: Negative for dysuria. Musculoskeletal: Positive for back pain and right hip pain. Skin: Negative for rash. Neurological: Negative for headaches, focal weakness or numbness.  ____________________________________________   PHYSICAL EXAM:  VITAL SIGNS: ED Triage Vitals  Enc Vitals Group     BP      Pulse      Resp      Temp      Temp src      SpO2      Weight      Height      Head Circumference      Peak Flow      Pain Score      Pain Loc      Pain Edu?      Excl. in GC?     Constitutional: Alert and oriented. Eyes: Conjunctivae are normal. Head: Small abrasion to posterior scalp with no hematoma or step-off. Nose: No congestion/rhinnorhea. Mouth/Throat: Mucous membranes are moist. Neck: Normal ROM Cardiovascular: Normal rate, regular rhythm. Grossly normal heart sounds. Respiratory: Normal respiratory effort.  No retractions. Lungs CTAB. Gastrointestinal: Soft and nontender. No distention. Genitourinary: deferred Musculoskeletal: No lower extremity tenderness nor edema.  Tenderness to midline lumbar spine and right hip diffusely. Neurologic:  Normal speech and language. No gross focal neurologic deficits are appreciated.  5-5 strength in bilateral upper and lower extremities, no pronator drift.  Skin:  Skin is warm, dry and intact. No rash noted. Psychiatric: Mood and affect are normal. Speech and behavior are normal.  ____________________________________________   LABS (all labs ordered are listed, but only abnormal results are displayed)  Labs Reviewed  BASIC METABOLIC PANEL - Abnormal; Notable for the following components:      Result Value   Glucose, Bld 152 (*)    BUN 31 (*)    Creatinine, Ser 1.89 (*)    GFR calc non Af Amer 33 (*)    GFR calc Af Amer 39 (*)    All other components within normal limits  CBC WITH DIFFERENTIAL/PLATELET - Abnormal; Notable for the following components:   WBC 11.4 (*)  HCT 38.3 (*)    Neutro Abs 8.7 (*)    All other components within normal limits  PROTIME-INR - Abnormal; Notable for the following components:   Prothrombin Time 25.6 (*)    INR 2.4 (*)    All other components within normal limits  TROPONIN I (HIGH SENSITIVITY)  TROPONIN I (HIGH SENSITIVITY)   ____________________________________________  EKG  ED ECG REPORT I, Chesley Noonharles Jessabelle Markiewicz, the attending physician, personally viewed and interpreted this ECG.   Date: 01/17/2019  EKG Time: 9:28  Rate: 93  Rhythm: normal sinus rhythm  Axis: LAD  Intervals:nonspecific intraventricular conduction delay  ST&T Change: None    PROCEDURES  Procedure(s) performed (including Critical Care):  Procedures   ____________________________________________   INITIAL IMPRESSION / ASSESSMENT AND PLAN / ED COURSE       78 year old male with past medical history of lupus anticoagulant on Coumadin presents to the ED following a fall.  Unclear etiology of his fall, patient states "my legs started walking backwards" and he initially reported dizziness but later denied this.  EKG unremarkable, will check labs including INR.  Will check CT head and C-spine as well as x-rays of lumbar spine and right hip.  He is neurovascularly intact to his lower extremities.  Patient with an episode  of lightheadedness and some difficulty catching his breath while undergoing CT.  He was evaluated upon return to the room, repeat EKG is unchanged.  He briefly required oxygen and chest x-ray was performed and negative for acute process.  He spontaneously improved and was taken off oxygen, maintained sats on room air.  CTs and plain films negative for acute process.  Patient reports feeling much better and is requesting to be discharged home.  Syncope work-up unremarkable, patient continues to deny feeling dizzy at this time, doubt arrhythmia.  Counseled patient to follow-up with PCP and return to the ED for new or worsening symptoms, patient agrees with plan.      ____________________________________________   FINAL CLINICAL IMPRESSION(S) / ED DIAGNOSES  Final diagnoses:  Fall, initial encounter  Contusion of other part of head, initial encounter  Dizziness     ED Discharge Orders    None       Note:  This document was prepared using Dragon voice recognition software and may include unintentional dictation errors.   Chesley NoonJessup, Hondo Nanda, MD 01/17/19 [phone]

## 2019-01-17 NOTE — ED Notes (Signed)
Called to verify troponin is being ran.

## 2019-01-17 NOTE — Telephone Encounter (Signed)
Pt called stating that fell backward and hit his right hip, back, and head; he did cut on his head which was bleeding; this occurred 01/17/2019 around 0600; the pt said that he was waling forward but fell backward; he rates his hip pain and back pain at 9 out of 10; he has not taken anything for pain; the pt does take coumadin daily last dose 01/17/2019 at 0615; recommendations made per nurse triage protocol; he verbalized understanding; the pt sees Dr Derrel Nip, [facility], and would like to be seen in the office; he can be contacted at [phone]; will route to office for notification.  Reason for Disposition . [1] SEVERE pain AND [2] not improved 2 hours after pain medicine/ice packs  Answer Assessment - Initial Assessment Questions 1. MECHANISM: "How did the injury happen?" (e.g., twisting injury, direct blow)      Pt fell  2. ONSET: "When did the injury happen?" (Minutes or hours ago)    01/17/2019 3. LOCATION: "Where is the injury located?"      Right hip 4. APPEARANCE of INJURY: "What does the injury look like?"  (e.g., deformity of leg)     Pt has not looked 5. SEVERITY: "Can you put weight on that leg?" "Can you walk?"     Yes but painful 6. SIZE: For cuts, bruises, or swelling, ask: "How large is it?" (e.g., inches or centimeters;  entire joint)   1/2 to 1 inch cut to head 7. PAIN: "Is there pain?" If so, ask: "How bad is the pain?"  (e.g., Scale 1-10; or mild, moderate, severe)   9 out of 10 8. TETANUS: For any breaks in the skin, ask: "When was the last tetanus booster?"     Not sure 9. OTHER SYMPTOMS: "Do you have any other symptoms?"      Back pain 10. PREGNANCY: "Is there any chance you are pregnant?" "When was your last menstrual period?"       n/a  Protocols used: HIP INJURY-A-AH

## 2019-01-17 NOTE — Telephone Encounter (Signed)
Pt has an appt scheduled for 03/29/2019.

## 2019-01-20 ENCOUNTER — Telehealth: Payer: Self-pay | Admitting: *Deleted

## 2019-01-20 NOTE — Telephone Encounter (Signed)
Copied from [place] [id number]. Topic: General - Inquiry >> Jan 20, 2019  3:10 PM Mathis Bud wrote: Reason for CRM: Patient would like Janett Billow PCP nurse to call him back.  Patient would not disclose what it was about. Patient call back [phone]

## 2019-01-23 NOTE — Telephone Encounter (Signed)
Spoke with pt and informed him to continue taking his current warfarin dose and have it rechecked in one month. Pt is scheduled for lab appt. Pt is aware of appt date and time.

## 2019-01-23 NOTE — Telephone Encounter (Signed)
Pt came in the office. I spoke with pt. He was not sure if he needed to keep his physical appt in November or not. I explained to pt that he does nee to keep that appt but that we were going to have to change it to an afternoon appt. Appt time has been changed and pt is aware of time. Pt also had a question about his INR lab. The pt was to come in the office last Tuesday to have it drawn but he was sent to the ER because he had fell backwards and hit his head. While in the ER they checked his INR and it was 2.4. Pt is wanting to know if he needs to continue taking his current dose or if it needs to be changed.

## 2019-01-23 NOTE — Telephone Encounter (Signed)
Continue current regimen  INR is at goal

## 2019-02-21 ENCOUNTER — Other Ambulatory Visit (INDEPENDENT_AMBULATORY_CARE_PROVIDER_SITE_OTHER): Payer: Medicare Other

## 2019-02-21 ENCOUNTER — Other Ambulatory Visit: Payer: Self-pay

## 2019-02-21 DIAGNOSIS — Z7901 Long term (current) use of anticoagulants: Secondary | ICD-10-CM

## 2019-02-21 LAB — PROTIME-INR
INR: 2.4 ratio — ABNORMAL HIGH (ref 0.8–1.0)
Prothrombin Time: 27.2 s — ABNORMAL HIGH (ref 9.6–13.1)

## 2019-03-14 DIAGNOSIS — D2261 Melanocytic nevi of right upper limb, including shoulder: Secondary | ICD-10-CM | POA: Diagnosis not present

## 2019-03-14 DIAGNOSIS — D2272 Melanocytic nevi of left lower limb, including hip: Secondary | ICD-10-CM | POA: Diagnosis not present

## 2019-03-14 DIAGNOSIS — D2262 Melanocytic nevi of left upper limb, including shoulder: Secondary | ICD-10-CM | POA: Diagnosis not present

## 2019-03-14 DIAGNOSIS — D225 Melanocytic nevi of trunk: Secondary | ICD-10-CM | POA: Diagnosis not present

## 2019-03-21 DIAGNOSIS — C44329 Squamous cell carcinoma of skin of other parts of face: Secondary | ICD-10-CM | POA: Diagnosis not present

## 2019-03-27 ENCOUNTER — Telehealth: Payer: Self-pay | Admitting: *Deleted

## 2019-03-27 NOTE — Telephone Encounter (Signed)
Copied from [place] [id number]. Topic: Clinical - COVID Pre-Screen >> Mar 27, 2019 12:03 PM Pauline Good wrote: 1. To the best of your knowledge, have you been in close contact with anyone with a confirmed diagnosis of COVID 19?  no  If no - Proceed to next question; If yes - Schedule patient for a virtual visit  2. Have you had any one or more of the following: fever, chills, cough, shortness of breath or any flu-like symptoms?  no  If no - Proceed to next question; If yes - Schedule patient for a virtual visit  3. Have you been diagnosed with or have a previous diagnosis of COVID 19?  no  If no - Proceed to next question; If yes - Schedule patient for a virtual visit  4. I am going to go over a few other symptoms with you. Please let me know if you are experiencing any of the following: no  Ear, nose or throat discomfort  A sore throat  Headache  Muscle pain  Diarrhea  Loss of taste or smell  If no - Continue with scheduling process; If yes - Document in scheduling notes   Thank you for answering these questions. Please know we will ask you these questions or similar questions when you arrive for your appointment and again it's how we are keeping everyone safe. Also, to keep you safe, please use the provided hand sanitizer when you enter the building. Stephen Kelly, we are asking everyone in the building to wear a mask because they help [place] prevent the spread of germs.   Do you have a mask of your own, if not, we are happy to provide one for you. The last thing I want to go over with you is the no visitor guidelines. This means no one can attend the appointment with you unless you need physical assistance. I understand this may be different from your past appointments and I know this may be difficult but please know if someone is driving you we are happy to call them for you once your appointment is over.

## 2019-03-29 ENCOUNTER — Encounter: Payer: Self-pay | Admitting: Internal Medicine

## 2019-03-29 ENCOUNTER — Other Ambulatory Visit: Payer: Self-pay

## 2019-03-29 ENCOUNTER — Encounter: Payer: Medicare Other | Admitting: Internal Medicine

## 2019-03-29 ENCOUNTER — Ambulatory Visit (INDEPENDENT_AMBULATORY_CARE_PROVIDER_SITE_OTHER): Payer: Medicare Other | Admitting: Internal Medicine

## 2019-03-29 VITALS — BP 138/72 | HR 90 | Temp 97.2°F | Resp 15 | Ht 67.0 in | Wt 207.0 lb

## 2019-03-29 DIAGNOSIS — E1129 Type 2 diabetes mellitus with other diabetic kidney complication: Secondary | ICD-10-CM | POA: Diagnosis not present

## 2019-03-29 DIAGNOSIS — N529 Male erectile dysfunction, unspecified: Secondary | ICD-10-CM

## 2019-03-29 DIAGNOSIS — Z299 Encounter for prophylactic measures, unspecified: Secondary | ICD-10-CM

## 2019-03-29 DIAGNOSIS — Z7901 Long term (current) use of anticoagulants: Secondary | ICD-10-CM

## 2019-03-29 DIAGNOSIS — E785 Hyperlipidemia, unspecified: Secondary | ICD-10-CM

## 2019-03-29 DIAGNOSIS — G4733 Obstructive sleep apnea (adult) (pediatric): Secondary | ICD-10-CM

## 2019-03-29 DIAGNOSIS — R809 Proteinuria, unspecified: Secondary | ICD-10-CM | POA: Diagnosis not present

## 2019-03-29 DIAGNOSIS — Z0000 Encounter for general adult medical examination without abnormal findings: Secondary | ICD-10-CM | POA: Diagnosis not present

## 2019-03-29 DIAGNOSIS — E1169 Type 2 diabetes mellitus with other specified complication: Secondary | ICD-10-CM

## 2019-03-29 DIAGNOSIS — R5383 Other fatigue: Secondary | ICD-10-CM

## 2019-03-29 LAB — POCT GLYCOSYLATED HEMOGLOBIN (HGB A1C): Hemoglobin A1C: 6.9 % — AB (ref 4.0–5.6)

## 2019-03-29 NOTE — Assessment & Plan Note (Signed)
Improving control with low GI diet and exercise .  hemoglobin A1c is < 7.0. Patient is referred for  eye exam and foot exam was done today.  There is  minimal  proteinuria on prior micro urinalysis .  Fasting lipids will be repeated  and statin therapy advised if indicated by application of new ACC guidelines based on patient's 10 year risk of CAD   Lab Results  Component Value Date   HGBA1C 6.9 (A) 03/29/2019   Lab Results  Component Value Date   MICROALBUR 6.6 (H) 03/24/2018

## 2019-03-29 NOTE — Patient Instructions (Signed)
Your fasting blood sugars should be under 120 and over 80   If your A1c is > 7.0 we will need to increase your  medication  And you will need to start checking your sugars 2 hours after dinner    Health Maintenance After Age 77 After age 69, you are at a higher risk for certain long-term diseases and infections as well as injuries from falls. Falls are a major cause of broken bones and head injuries in people who are older than age 39. Getting regular preventive care can help to keep you healthy and well. Preventive care includes getting regular testing and making lifestyle changes as recommended by your health care provider. Talk with your health care provider about:  Which screenings and tests you should have. A screening is a test that checks for a disease when you have no symptoms.  A diet and exercise plan that is right for you. What should I know about screenings and tests to prevent falls? Screening and testing are the best ways to find a health problem early. Early diagnosis and treatment give you the best chance of managing medical conditions that are common after age 58. Certain conditions and lifestyle choices may make you more likely to have a fall. Your health care provider may recommend:  Regular vision checks. Poor vision and conditions such as cataracts can make you more likely to have a fall. If you wear glasses, make sure to get your prescription updated if your vision changes.  Medicine review. Work with your health care provider to regularly review all of the medicines you are taking, including over-the-counter medicines. Ask your health care provider about any side effects that may make you more likely to have a fall. Tell your health care provider if any medicines that you take make you feel dizzy or sleepy.  Osteoporosis screening. Osteoporosis is a condition that causes the bones to get weaker. This can make the bones weak and cause them to break more easily.  Blood  pressure screening. Blood pressure changes and medicines to control blood pressure can make you feel dizzy.  Strength and balance checks. Your health care provider may recommend certain tests to check your strength and balance while standing, walking, or changing positions.  Foot health exam. Foot pain and numbness, as well as not wearing proper footwear, can make you more likely to have a fall.  Depression screening. You may be more likely to have a fall if you have a fear of falling, feel emotionally low, or feel unable to do activities that you used to do.  Alcohol use screening. Using too much alcohol can affect your balance and may make you more likely to have a fall. What actions can I take to lower my risk of falls? General instructions  Talk with your health care provider about your risks for falling. Tell your health care provider if: ? You fall. Be sure to tell your health care provider about all falls, even ones that seem minor. ? You feel dizzy, sleepy, or off-balance.  Take over-the-counter and prescription medicines only as told by your health care provider. These include any supplements.  Eat a healthy diet and maintain a healthy weight. A healthy diet includes low-fat dairy products, low-fat (lean) meats, and fiber from whole grains, beans, and lots of fruits and vegetables. Home safety  Remove any tripping hazards, such as rugs, cords, and clutter.  Install safety equipment such as grab bars in bathrooms and safety rails on stairs.  Keep rooms and walkways well-lit. Activity   Follow a regular exercise program to stay fit. This will help you maintain your balance. Ask your health care provider what types of exercise are appropriate for you.  If you need a cane or walker, use it as recommended by your health care provider.  Wear supportive shoes that have nonskid soles. Lifestyle  Do not drink alcohol if your health care provider tells you not to drink.  If you  drink alcohol, limit how much you have: ? 0-1 drink a day for women. ? 0-2 drinks a day for men.  Be aware of how much alcohol is in your drink. In the [place] one drink equals one typical bottle of beer (12 oz), one-half glass of wine (5 oz), or one shot of hard liquor (1 oz).  Do not use any products that contain nicotine or tobacco, such as cigarettes and e-cigarettes. If you need help quitting, ask your health care provider. Summary  Having a healthy lifestyle and getting preventive care can help to protect your health and wellness after age 38.  Screening and testing are the best way to find a health problem early and help you avoid having a fall. Early diagnosis and treatment give you the best chance for managing medical conditions that are more common for people who are older than age 37.  Falls are a major cause of broken bones and head injuries in people who are older than age 57. Take precautions to prevent a fall at home.  Work with your health care provider to learn what changes you can make to improve your health and wellness and to prevent falls. This information is not intended to replace advice given to you by your health care provider. Make sure you discuss any questions you have with your health care provider. Document Released: 03/24/2017 Document Revised: 09/01/2018 Document Reviewed: 03/24/2017 Elsevier Patient Education  2020 [employer].

## 2019-03-29 NOTE — Progress Notes (Signed)
Patient ID: Stephen Kelly, male    DOB: 07-Apr-1941  Age: 78 y.o. MRN: [id number]  The patient is here for annual preventive  examination and management of other chronic and acute problems.   The risk factors are reflected in the social history.  The roster of all physicians providing medical care to patient - is listed in the Snapshot section of the chart.  Activities of daily living:  The patient is 100% independent in all ADLs: dressing, toileting, feeding as well as independent mobility  Home safety : The patient has smoke detectors in the home. They wear seatbelts.  There are no firearms at home. There is no violence in the home.   There is no risks for hepatitis, STDs or HIV. There is no   history of blood transfusion. They have no travel history to infectious disease endemic areas of the world.  The patient has seen their dentist in the last six month. They have seen their eye doctor in the last year. They admit to slight hearing difficulty with regard to whispered voices and some television programs.  They have deferred audiologic testing in the last year.  He has  No interest in a hearing aid. He no longer has  excessive sun exposure because he has given up lawn mowing since he remarried. . Discussed the need for sun protection: hats, long sleeves and use of sunscreen if there is significant sun exposure.   Diet: the importance of a healthy diet is discussed. They do have a healthy diet.  The benefits of regular aerobic exercise were discussed. he walks 4 times per week ,  20 minutes.   Depression screen: there are no signs or vegative symptoms of depression- irritability, change in appetite, anhedonia, sadness/tearfullness.  Cognitive assessment: the patient manages all their financial and personal affairs and is actively engaged. They could relate day,date,year and events; recalled 2/3 objects at 3 minutes; performed clock-face test normally.  The following portions of the patient's  history were reviewed and updated as appropriate: allergies, current medications, past family history, past medical history,  past surgical history, past social history  and problem list.  Visual acuity was not assessed per patient preference since she has regular follow up with her ophthalmologist. Hearing and body mass index were assessed and reviewed.   During the course of the visit the patient was educated and counseled about appropriate screening and preventive services including : fall prevention , diabetes screening, nutrition counseling, colorectal cancer screening, and recommended immunizations.    CC: The primary encounter diagnosis was Type 2 diabetes mellitus with microalbuminuria, without long-term current use of insulin (HCC). Diagnoses of Hyperlipidemia associated with type 2 diabetes mellitus (HCC), Long term current use of anticoagulant therapy, Fatigue, unspecified type, OSA (obstructive sleep apnea), and Encounter for preventive measure were also pertinent to this visit.  1) skin cancer right forehead removed last week by [facility]  2) Type 2 DM:   He feels generally well, is  Walking  several times per week and checking blood sugars once daily in the am.   BS have been under 156 fasting and < 130.  Had one isolated  reading of 426  .  Denies any recent hypoglyemic events.  Taking his medications as directed. Following a carbohydrate modified diet 6 days per week. Denies numbness, burning and tingling of extremities. Appetite is good.   3) Obesity :  remarried 3 months ago .  Has gained 10 lbs since last year  .  4) ER visit reviewed  For fall in August . Had near syncopal event with desaturations  while in CT scanner .  He was advised to follow up with PCP but did not pursue .    5) easily  Fatigued. Wonders if he has "LOW T"  Denies chest pain,  Shortness of breath    6) OSA:  Diagnosed by home  sleep study in Nov 2019 . Patient is using CPAP every night a minimum of 6 hours  per night and notes improved daytime wakefulness.   Lab Results  Component Value Date   HGBA1C 6.9 (A) 03/29/2019      [name] has a past medical history of Clotting disorder ([place]) (2011), Gout, arthritis, History of renal calculi (2001), Hyperlipidemia, Hypertension, 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[place]), Patent foramen ovale, and Pulmonary nodule, right (2011).   He has a past surgical history that includes Thrombectomy.   His family history includes Cancer in his mother; Diabetes in his mother; Heart disease in his mother; Hypertension in his mother; Mental illness in his father; Stroke in his paternal grandmother.He reports that he has never smoked. He has never used smokeless tobacco. He reports that he does not drink alcohol or use drugs.  Outpatient Medications Prior to Visit  Medication Sig Dispense Refill  . allopurinol (ZYLOPRIM) 300 MG tablet Take 1 tablet (300 mg total) by mouth daily. 90 tablet 1  . atorvastatin (LIPITOR) 20 MG tablet Take 1 tablet (20 mg total) by mouth daily. 90 tablet 1  . glipiZIDE (GLUCOTROL XL) 2.5 MG 24 hr tablet Take 1 tablet (2.5 mg total) by mouth daily with breakfast. 90 tablet 1  . hydrochlorothiazide (HYDRODIURIL) 25 MG tablet Take 1 tablet (25 mg total) by mouth daily. 90 tablet 1  . losartan (COZAAR) 100 MG tablet Take 1 tablet (100 mg total) by mouth daily. 90 tablet 1  . metFORMIN (GLUCOPHAGE) 850 MG tablet Take 1 tablet (850 mg total) by mouth 2 (two) times daily with a meal. 180 tablet 1  . warfarin (COUMADIN) 3 MG tablet Take 1 tablet (3 mg total) by mouth daily. 90 tablet 0   No facility-administered medications prior to visit.     Review of Systems   Patient denies headache, fevers, malaise, unintentional weight loss, skin rash, eye pain, sinus congestion and sinus pain, sore throat, dysphagia,  hemoptysis , cough, dyspnea, wheezing, chest pain, palpitations, orthopnea, edema, abdominal pain, nausea, melena, diarrhea,  constipation, flank pain, dysuria, hematuria, urinary  Frequency, nocturia, numbness, tingling, seizures,  Focal weakness, Loss of consciousness,  Tremor, insomnia, depression, anxiety, and suicidal ideation.      Objective:  BP 138/72 (BP Location: Left Arm, Patient Position: Sitting, Cuff Size: Normal)   Pulse 90   Temp (!) 97.2 F (36.2 C) (Temporal)   Resp 15   Ht 5\' 7"  (1.702 m)   Wt 207 lb (93.9 kg)   SpO2 98%   BMI 32.42 kg/m   Physical Exam   General appearance: alert, cooperative and appears stated age Ears: normal TM's and external ear canals both ears Throat: lips, mucosa, and tongue normal; teeth and gums normal Neck: no adenopathy, no carotid bruit, supple, symmetrical, trachea midline and thyroid not enlarged, symmetric, no tenderness/mass/nodules Back: symmetric, no curvature. ROM normal. No CVA tenderness. Lungs: clear to auscultation bilaterally Heart: regular rate and rhythm, S1, S2 normal, no murmur, click, rub or gallop Abdomen: soft, non-tender; bowel sounds normal; no masses,  no organomegaly Pulses: 2+ and symmetric Skin:  Skin color, texture, turgor normal. No rashes or lesions Lymph nodes: Cervical, supraclavicular, and axillary nodes normal.    Assessment & Plan:   Problem List Items Addressed This Visit      Unprioritized   OSA (obstructive sleep apnea)    Diagnosed by prior sleep study. Patient is using CPAP every night a minimum of 6 hours per night and notes improved daytime wakefulness.       Hyperlipidemia associated with type 2 diabetes mellitus (HCC)    LDL is  < 70  with atorvastatin . LFTs are normal and he is tolerating therapy.   no changes today,   Lab Results  Component Value Date   CHOL 101 03/24/2018   HDL 31.70 (L) 03/24/2018   LDLCALC 17 03/17/2017   LDLDIRECT 43.0 03/24/2018   TRIG 243.0 (H) 03/24/2018   CHOLHDL 3 03/24/2018   Lab Results  Component Value Date   ALT 12 03/24/2018   AST 13 03/24/2018   ALKPHOS 46  03/24/2018   BILITOT 0.5 03/24/2018             Relevant Orders   Lipid panel   Encounter for preventive measure    age appropriate education and counseling updated, referrals for preventative services and immunizations addressed, dietary and smoking counseling addressed, most recent labs reviewed.  I have personally reviewed and have noted:  1) the patient's medical and social history 2) The pt's use of alcohol, tobacco, and illicit drugs 3) The patient's current medications and supplements 4) Functional ability including ADL's, fall risk, home safety risk, hearing and visual impairment 5) Diet and physical activities 6) Evidence for depression or mood disorder 7) The patient's height, weight, and BMI have been recorded in the chart  I have made referrals, and provided counseling and education based on review of the above      Type 2 diabetes mellitus with microalbuminuria (HCC) - Primary    Improving control with low GI diet and exercise .  hemoglobin A1c is < 7.0. Patient is referred for  eye exam and foot exam was done today.  There is  minimal  proteinuria on prior micro urinalysis .  Fasting lipids will be repeated  and statin therapy advised if indicated by application of new ACC guidelines based on patient's 10 year risk of CAD   Lab Results  Component Value Date   HGBA1C 6.9 (A) 03/29/2019   Lab Results  Component Value Date   MICROALBUR 6.6 (H) 03/24/2018          Relevant Orders   POCT HgB A1C (Completed)   Comprehensive metabolic panel   Microalbumin / creatinine urine ratio   Fatigue    Etiology unclear.  Will screen for thyroid, anemia,  hepatic and renal insufficiency, encourage regular exercise 5 days /week,  consider  Testosterone screening and cardiology evaluation if all are normal      Relevant Orders   Vitamin B12   RBC Folate   TSH   CBC with Differential/Platelet   Long term current use of anticoagulant therapy   Relevant Orders    Protime-INR      I am having Stephen HoitLevi B. Suzie PortelaPayne "[name]" maintain his allopurinol, hydrochlorothiazide, metFORMIN, losartan, glipiZIDE, warfarin, and atorvastatin.  No orders of the defined types were placed in this encounter.   There are no discontinued medications.  Follow-up: No follow-ups on file.   Sherlene Shamseresa L , MD

## 2019-03-29 NOTE — Assessment & Plan Note (Signed)
Etiology unclear.  Will screen for thyroid, anemia,  hepatic and renal insufficiency, encourage regular exercise 5 days /week,  consider  Testosterone screening and cardiology evaluation if all are normal

## 2019-03-29 NOTE — Assessment & Plan Note (Signed)
Diagnosed by prior sleep study. Patient is using CPAP every night a minimum of 6 hours per night and notes improved daytime wakefulness.

## 2019-03-29 NOTE — Assessment & Plan Note (Signed)

## 2019-03-29 NOTE — Assessment & Plan Note (Signed)
LDL is  < 70  with atorvastatin . LFTs are normal and he is tolerating therapy.   no changes today,   Lab Results  Component Value Date   CHOL 101 03/24/2018   HDL 31.70 (L) 03/24/2018   LDLCALC 17 03/17/2017   LDLDIRECT 43.0 03/24/2018   TRIG 243.0 (H) 03/24/2018   CHOLHDL 3 03/24/2018   Lab Results  Component Value Date   ALT 12 03/24/2018   AST 13 03/24/2018   ALKPHOS 46 03/24/2018   BILITOT 0.5 03/24/2018

## 2019-03-30 LAB — CBC WITH DIFFERENTIAL/PLATELET
Basophils Absolute: 0.1 10*3/uL (ref 0.0–0.1)
Basophils Relative: 0.9 % (ref 0.0–3.0)
Eosinophils Absolute: 0.8 10*3/uL — ABNORMAL HIGH (ref 0.0–0.7)
Eosinophils Relative: 7.3 % — ABNORMAL HIGH (ref 0.0–5.0)
HCT: 37.9 % — ABNORMAL LOW (ref 39.0–52.0)
Hemoglobin: 12.8 g/dL — ABNORMAL LOW (ref 13.0–17.0)
Lymphocytes Relative: 25.2 % (ref 12.0–46.0)
Lymphs Abs: 2.7 10*3/uL (ref 0.7–4.0)
MCHC: 33.9 g/dL (ref 30.0–36.0)
MCV: 91.2 fl (ref 78.0–100.0)
Monocytes Absolute: 0.6 10*3/uL (ref 0.1–1.0)
Monocytes Relative: 6 % (ref 3.0–12.0)
Neutro Abs: 6.5 10*3/uL (ref 1.4–7.7)
Neutrophils Relative %: 60.6 % (ref 43.0–77.0)
Platelets: 277 10*3/uL (ref 150.0–400.0)
RBC: 4.15 Mil/uL — ABNORMAL LOW (ref 4.22–5.81)
RDW: 14.8 % (ref 11.5–15.5)
WBC: 10.7 10*3/uL — ABNORMAL HIGH (ref 4.0–10.5)

## 2019-03-30 LAB — COMPREHENSIVE METABOLIC PANEL
ALT: 28 U/L (ref 0–53)
AST: 22 U/L (ref 0–37)
Albumin: 4.1 g/dL (ref 3.5–5.2)
Alkaline Phosphatase: 43 U/L (ref 39–117)
BUN: 26 mg/dL — ABNORMAL HIGH (ref 6–23)
CO2: 26 mEq/L (ref 19–32)
Calcium: 9.3 mg/dL (ref 8.4–10.5)
Chloride: 102 mEq/L (ref 96–112)
Creatinine, Ser: 1.77 mg/dL — ABNORMAL HIGH (ref 0.40–1.50)
GFR: 37.42 mL/min — ABNORMAL LOW (ref >60.00)
Glucose, Bld: 178 mg/dL — ABNORMAL HIGH (ref 70–99)
Potassium: 3.9 mEq/L (ref 3.5–5.1)
Sodium: 138 mEq/L (ref 135–145)
Total Bilirubin: 0.3 mg/dL (ref 0.2–1.2)
Total Protein: 6.3 g/dL (ref 6.0–8.3)

## 2019-03-30 LAB — TSH: TSH: 2.96 u[IU]/mL (ref 0.35–4.50)

## 2019-03-30 LAB — PROTIME-INR
INR: 2.3 — ABNORMAL HIGH
Prothrombin Time: 23 s — ABNORMAL HIGH (ref 9.0–11.5)

## 2019-03-30 LAB — MICROALBUMIN / CREATININE URINE RATIO
Creatinine,U: 159.9 mg/dL
Microalb Creat Ratio: 14.6 mg/g (ref 0.0–30.0)
Microalb, Ur: 23.4 mg/dL — ABNORMAL HIGH (ref 0.0–1.9)

## 2019-03-30 LAB — LIPID PANEL
Cholesterol: 104 mg/dL (ref 0–200)
HDL: 29.7 mg/dL — ABNORMAL LOW (ref >39.00)
Total CHOL/HDL Ratio: 4
Triglycerides: 435 mg/dL — ABNORMAL HIGH (ref 0.0–149.0)

## 2019-03-30 LAB — FOLATE RBC: RBC Folate: 917 ng/mL RBC (ref >280)

## 2019-03-30 LAB — LDL CHOLESTEROL, DIRECT: Direct LDL: 39 mg/dL

## 2019-03-30 LAB — VITAMIN B12: Vitamin B-12: 670 pg/mL (ref 211–911)

## 2019-03-31 ENCOUNTER — Other Ambulatory Visit: Payer: Self-pay | Admitting: *Deleted

## 2019-03-31 MED ORDER — WARFARIN SODIUM 3 MG PO TABS: 3.0000 mg | ORAL_TABLET | Freq: Every day | ORAL | 0 refills | Status: DC

## 2019-03-31 MED ORDER — HYDROCHLOROTHIAZIDE 25 MG PO TABS: 25.0000 mg | ORAL_TABLET | Freq: Every day | ORAL | 1 refills | Status: DC

## 2019-03-31 MED ORDER — ALLOPURINOL 300 MG PO TABS: 300.0000 mg | ORAL_TABLET | Freq: Every day | ORAL | 1 refills | Status: DC

## 2019-03-31 MED ORDER — METFORMIN HCL 850 MG PO TABS: 850.0000 mg | ORAL_TABLET | Freq: Two times a day (BID) | ORAL | 1 refills | Status: DC

## 2019-04-13 ENCOUNTER — Telehealth: Payer: Self-pay

## 2019-04-13 NOTE — Telephone Encounter (Signed)
Copied from [place] [id number]. Topic: General - Inquiry >> Apr 13, 2019  3:51 PM Mathis Bud wrote: Reason for CRM: Patient is requesting a call back regarding his lab results Call back [phone]  I called and spoke with the patient and gave him his lab results from 03/29/2019, and pt understood.  Nina,cma

## 2019-06-28 ENCOUNTER — Other Ambulatory Visit: Payer: Self-pay

## 2019-06-28 MED ORDER — ATORVASTATIN CALCIUM 20 MG PO TABS: 20.0000 mg | ORAL_TABLET | Freq: Every day | ORAL | 1 refills | Status: DC

## 2019-06-28 MED ORDER — WARFARIN SODIUM 3 MG PO TABS: 3.0000 mg | ORAL_TABLET | Freq: Every day | ORAL | 0 refills | Status: DC

## 2019-06-28 MED ORDER — GLIPIZIDE ER 2.5 MG PO TB24: 2.5000 mg | ORAL_TABLET | Freq: Every day | ORAL | 1 refills | Status: DC

## 2019-06-28 MED ORDER — LOSARTAN POTASSIUM 100 MG PO TABS: 100.0000 mg | ORAL_TABLET | Freq: Every day | ORAL | 1 refills | Status: DC

## 2019-08-28 DIAGNOSIS — D2262 Melanocytic nevi of left upper limb, including shoulder: Secondary | ICD-10-CM | POA: Diagnosis not present

## 2019-08-28 DIAGNOSIS — D2271 Melanocytic nevi of right lower limb, including hip: Secondary | ICD-10-CM | POA: Diagnosis not present

## 2019-08-28 DIAGNOSIS — D2261 Melanocytic nevi of right upper limb, including shoulder: Secondary | ICD-10-CM | POA: Diagnosis not present

## 2019-08-28 DIAGNOSIS — Z85828 Personal history of other malignant neoplasm of skin: Secondary | ICD-10-CM | POA: Diagnosis not present

## 2019-09-05 ENCOUNTER — Telehealth: Payer: Self-pay | Admitting: Internal Medicine

## 2019-09-05 DIAGNOSIS — E1129 Type 2 diabetes mellitus with other diabetic kidney complication: Secondary | ICD-10-CM

## 2019-09-05 DIAGNOSIS — Z7901 Long term (current) use of anticoagulants: Secondary | ICD-10-CM

## 2019-09-05 DIAGNOSIS — E1169 Type 2 diabetes mellitus with other specified complication: Secondary | ICD-10-CM

## 2019-09-05 NOTE — Telephone Encounter (Signed)
Pt wants to know if he needs to have INR. Please advise

## 2019-09-05 NOTE — Telephone Encounter (Signed)
Thank you.  Can you aksi Please schedule him an office visit so I can evaluate his memory.  I have added labs that he should have done prior to his visit (he is due for diabetes follow up , etc.)

## 2019-09-05 NOTE — Telephone Encounter (Signed)
Pt has not had his INR checked since 03/2019. He stated that he thought he was supposed to have it done every 6 months. Pt stated that he is taking 3 mg daily. I have ordered the lab and scheduled the pt a lab appt for this Thursday.

## 2019-09-07 ENCOUNTER — Other Ambulatory Visit: Payer: Self-pay

## 2019-09-07 ENCOUNTER — Other Ambulatory Visit (INDEPENDENT_AMBULATORY_CARE_PROVIDER_SITE_OTHER): Payer: Medicare Other

## 2019-09-07 DIAGNOSIS — Z7901 Long term (current) use of anticoagulants: Secondary | ICD-10-CM | POA: Diagnosis not present

## 2019-09-07 DIAGNOSIS — E1169 Type 2 diabetes mellitus with other specified complication: Secondary | ICD-10-CM

## 2019-09-07 DIAGNOSIS — E1129 Type 2 diabetes mellitus with other diabetic kidney complication: Secondary | ICD-10-CM

## 2019-09-07 DIAGNOSIS — R809 Proteinuria, unspecified: Secondary | ICD-10-CM

## 2019-09-07 DIAGNOSIS — E782 Mixed hyperlipidemia: Secondary | ICD-10-CM

## 2019-09-07 LAB — PROTIME-INR
INR: 2 ratio — ABNORMAL HIGH (ref 0.8–1.0)
Prothrombin Time: 22.2 s — ABNORMAL HIGH (ref 9.6–13.1)

## 2019-09-11 NOTE — Telephone Encounter (Signed)
Spoke with pt and he has been scheduled for an office visit on May 5th. Pt is aware of appt date and time.

## 2019-09-25 ENCOUNTER — Other Ambulatory Visit: Payer: Self-pay

## 2019-09-25 DIAGNOSIS — C44229 Squamous cell carcinoma of skin of left ear and external auricular canal: Secondary | ICD-10-CM | POA: Diagnosis not present

## 2019-09-27 ENCOUNTER — Encounter: Payer: Self-pay | Admitting: Internal Medicine

## 2019-09-27 ENCOUNTER — Other Ambulatory Visit: Payer: Self-pay

## 2019-09-27 ENCOUNTER — Ambulatory Visit: Payer: Medicare Other | Admitting: Internal Medicine

## 2019-09-27 VITALS — BP 128/88 | HR 86 | Temp 97.5°F | Resp 16 | Ht 67.0 in | Wt 206.8 lb

## 2019-09-27 DIAGNOSIS — E782 Mixed hyperlipidemia: Secondary | ICD-10-CM | POA: Diagnosis not present

## 2019-09-27 DIAGNOSIS — E1129 Type 2 diabetes mellitus with other diabetic kidney complication: Secondary | ICD-10-CM

## 2019-09-27 DIAGNOSIS — Z7901 Long term (current) use of anticoagulants: Secondary | ICD-10-CM | POA: Diagnosis not present

## 2019-09-27 DIAGNOSIS — R5383 Other fatigue: Secondary | ICD-10-CM

## 2019-09-27 DIAGNOSIS — R809 Proteinuria, unspecified: Secondary | ICD-10-CM

## 2019-09-27 DIAGNOSIS — G4733 Obstructive sleep apnea (adult) (pediatric): Secondary | ICD-10-CM

## 2019-09-27 DIAGNOSIS — E785 Hyperlipidemia, unspecified: Secondary | ICD-10-CM

## 2019-09-27 DIAGNOSIS — E1121 Type 2 diabetes mellitus with diabetic nephropathy: Secondary | ICD-10-CM

## 2019-09-27 DIAGNOSIS — I10 Essential (primary) hypertension: Secondary | ICD-10-CM

## 2019-09-27 DIAGNOSIS — N529 Male erectile dysfunction, unspecified: Secondary | ICD-10-CM | POA: Diagnosis not present

## 2019-09-27 DIAGNOSIS — E1169 Type 2 diabetes mellitus with other specified complication: Secondary | ICD-10-CM

## 2019-09-27 LAB — CBC WITH DIFFERENTIAL/PLATELET
Basophils Absolute: 0.1 10*3/uL (ref 0.0–0.1)
Basophils Relative: 0.7 % (ref 0.0–3.0)
Eosinophils Absolute: 0.8 10*3/uL — ABNORMAL HIGH (ref 0.0–0.7)
Eosinophils Relative: 8.5 % — ABNORMAL HIGH (ref 0.0–5.0)
HCT: 37.9 % — ABNORMAL LOW (ref 39.0–52.0)
Hemoglobin: 12.9 g/dL — ABNORMAL LOW (ref 13.0–17.0)
Lymphocytes Relative: 26.5 % (ref 12.0–46.0)
Lymphs Abs: 2.4 10*3/uL (ref 0.7–4.0)
MCHC: 34.2 g/dL (ref 30.0–36.0)
MCV: 92.2 fl (ref 78.0–100.0)
Monocytes Absolute: 0.6 10*3/uL (ref 0.1–1.0)
Monocytes Relative: 7 % (ref 3.0–12.0)
Neutro Abs: 5.2 10*3/uL (ref 1.4–7.7)
Neutrophils Relative %: 57.3 % (ref 43.0–77.0)
Platelets: 233 10*3/uL (ref 150.0–400.0)
RBC: 4.11 Mil/uL — ABNORMAL LOW (ref 4.22–5.81)
RDW: 14.9 % (ref 11.5–15.5)
WBC: 9 10*3/uL (ref 4.0–10.5)

## 2019-09-27 LAB — COMPREHENSIVE METABOLIC PANEL
ALT: 13 U/L (ref 0–53)
AST: 12 U/L (ref 0–37)
Albumin: 4.1 g/dL (ref 3.5–5.2)
Alkaline Phosphatase: 45 U/L (ref 39–117)
BUN: 27 mg/dL — ABNORMAL HIGH (ref 6–23)
CO2: 26 mEq/L (ref 19–32)
Calcium: 9.2 mg/dL (ref 8.4–10.5)
Chloride: 104 mEq/L (ref 96–112)
Creatinine, Ser: 1.47 mg/dL (ref 0.40–1.50)
GFR: 46.31 mL/min — ABNORMAL LOW (ref >60.00)
Glucose, Bld: 157 mg/dL — ABNORMAL HIGH (ref 70–99)
Potassium: 4 mEq/L (ref 3.5–5.1)
Sodium: 137 mEq/L (ref 135–145)
Total Bilirubin: 0.5 mg/dL (ref 0.2–1.2)
Total Protein: 6.5 g/dL (ref 6.0–8.3)

## 2019-09-27 LAB — MICROALBUMIN / CREATININE URINE RATIO
Creatinine,U: 94.4 mg/dL
Microalb Creat Ratio: 16.9 mg/g (ref 0.0–30.0)
Microalb, Ur: 15.9 mg/dL — ABNORMAL HIGH (ref 0.0–1.9)

## 2019-09-27 LAB — LIPID PANEL
Cholesterol: 109 mg/dL (ref 0–200)
HDL: 28.9 mg/dL — ABNORMAL LOW (ref >39.00)
NonHDL: 79.99
Total CHOL/HDL Ratio: 4
Triglycerides: 338 mg/dL — ABNORMAL HIGH (ref 0.0–149.0)
VLDL: 67.6 mg/dL — ABNORMAL HIGH (ref 0.0–40.0)

## 2019-09-27 LAB — LDL CHOLESTEROL, DIRECT: Direct LDL: 37 mg/dL

## 2019-09-27 LAB — HEMOGLOBIN A1C: Hgb A1c MFr Bld: 7.3 % — ABNORMAL HIGH (ref 4.6–6.5)

## 2019-09-27 LAB — TSH: TSH: 2.25 u[IU]/mL (ref 0.35–4.50)

## 2019-09-27 NOTE — Assessment & Plan Note (Signed)
Patient has  microalbuminuria. Patient is tolerating statin therapy for CAD risk reduction and on ACE/ARB for  microalbuminuria and  hypertension   Lab Results  Component Value Date   HGBA1C 7.3 (H) 09/27/2019   Lab Results  Component Value Date   MICROALBUR 15.9 (H) 09/27/2019

## 2019-09-27 NOTE — Assessment & Plan Note (Addendum)
Loss of control and weight gain since getting remarried and stopping Januvia. .  hemoglobin A1c has risen to 7.3 . Patient is referred for  eye exam and foot exam was done today.  There is  minimal  proteinuria on prior micro urinalysis .  Advised to start exercsing.   Lab Results  Component Value Date   HGBA1C 7.3 (H) 09/27/2019   Lab Results  Component Value Date   MICROALBUR 15.9 (H) 09/27/2019

## 2019-09-27 NOTE — Addendum Note (Signed)
Addended by: Sherlene Shams on: 09/27/2019 10:11 AM   Modules accepted: Orders

## 2019-09-27 NOTE — Progress Notes (Signed)
Subjective:  Patient ID: Stephen Kelly, male    DOB: [birth year]  Age: 79 y.o. MRN: [id number]  CC: The primary encounter diagnosis was Erectile dysfunction, unspecified erectile dysfunction type. Diagnoses of Long term current use of anticoagulant therapy, Type 2 diabetes mellitus with microalbuminuria, without long-term current use of insulin ([place]), Hyperlipidemia associated with type 2 diabetes mellitus ([place]), Mixed hyperlipidemia, Essential hypertension, Diabetic nephropathy associated with type 2 diabetes mellitus ([place]), OSA (obstructive sleep apnea), and Fatigue, unspecified type were also pertinent to this visit.  HPI Stephen Kelly presents for follow up on type 2 DM, hypertension and hyperlipidemia. Last seen in November for annual  Cc: "I stay tired."   Per new wife, he does toss and turn , but does not snore.  Wakes up feeling tired some days,  Some days not.  Feels tired taking the  Trash can on wheels to the street which is 300 feet or less.  Does not exercise regularly.  lawnwork is all automated.   Thinks he has low testosterone .  Having trouble achieving and  sustaining an erection.  Has  tried Viagra up to 100 mg with no appreciable effects back in 2017  DM:  Does not check sugars.  Eats what he wants.  " I don't eat sweets" taking glipizide once daily   Hypertension: patient checks blood pressure twice weekly at home.  Readings have been for the most part < 140/80 at rest . Patient is following a reduced salt diet most days and is taking medications as prescribed  Outpatient Medications Prior to Visit  Medication Sig Dispense Refill  . allopurinol (ZYLOPRIM) 300 MG tablet Take 1 tablet (300 mg total) by mouth daily. 90 tablet 1  . atorvastatin (LIPITOR) 20 MG tablet Take 1 tablet (20 mg total) by mouth daily. 90 tablet 1  . glipiZIDE (GLUCOTROL XL) 2.5 MG 24 hr tablet Take 1 tablet (2.5 mg total) by mouth daily with breakfast. 90 tablet 1  . hydrochlorothiazide (HYDRODIURIL) 25 MG  tablet Take 1 tablet (25 mg total) by mouth daily. 90 tablet 1  . losartan (COZAAR) 100 MG tablet Take 1 tablet (100 mg total) by mouth daily. 90 tablet 1  . metFORMIN (GLUCOPHAGE) 850 MG tablet Take 1 tablet (850 mg total) by mouth 2 (two) times daily with a meal. 180 tablet 1  . warfarin (COUMADIN) 3 MG tablet Take 1 tablet (3 mg total) by mouth daily. 90 tablet 0   No facility-administered medications prior to visit.    Review of Systems;  Patient denies headache, fevers, malaise, unintentional weight loss, skin rash, eye pain, sinus congestion and sinus pain, sore throat, dysphagia,  hemoptysis , cough, dyspnea, wheezing, chest pain, palpitations, orthopnea, edema, abdominal pain, nausea, melena, diarrhea, constipation, flank pain, dysuria, hematuria, urinary  Frequency, nocturia, numbness, tingling, seizures,  Focal weakness, Loss of consciousness,  Tremor, insomnia, depression, anxiety, and suicidal ideation.      Objective:  BP 128/88 (BP Location: Left Arm, Patient Position: Sitting, Cuff Size: Normal)   Pulse 86   Temp (!) 97.5 F (36.4 C) (Temporal)   Resp 16   Ht 5\' 7"  (1.702 m)   Wt 206 lb 12.8 oz (93.8 kg)   SpO2 99%   BMI 32.39 kg/m   BP Readings from Last 3 Encounters:  09/27/19 128/88  03/29/19 138/72  01/17/19 (!) 102/46    Wt Readings from Last 3 Encounters:  09/27/19 206 lb 12.8 oz (93.8 kg)  03/29/19 207 lb (93.9  kg)  01/17/19 190 lb (86.2 kg)    General appearance: alert, cooperative and appears stated age Ears: normal TM's and external ear canals both ears Throat: lips, mucosa, and tongue normal; teeth and gums normal Neck: no adenopathy, no carotid bruit, supple, symmetrical, trachea midline and thyroid not enlarged, symmetric, no tenderness/mass/nodules Back: symmetric, no curvature. ROM normal. No CVA tenderness. Lungs: clear to auscultation bilaterally Heart: regular rate and rhythm, S1, S2 normal, no murmur, click, rub or gallop Abdomen: soft,  non-tender; bowel sounds normal; no masses,  no organomegaly Pulses: 2+ and symmetric Skin: Skin color, texture, turgor normal. No rashes or lesions Lymph nodes: Cervical, supraclavicular, and axillary nodes normal.  Lab Results  Component Value Date   HGBA1C 7.3 (H) 09/27/2019   HGBA1C 6.9 (A) 03/29/2019   HGBA1C 7.0 (H) 03/24/2018    Lab Results  Component Value Date   CREATININE 1.47 09/27/2019   CREATININE 1.77 (H) 03/29/2019   CREATININE 1.89 (H) 01/17/2019    Lab Results  Component Value Date   WBC 9.0 09/27/2019   HGB 12.9 (L) 09/27/2019   HCT 37.9 (L) 09/27/2019   PLT 233.0 09/27/2019   GLUCOSE 157 (H) 09/27/2019   CHOL 109 09/27/2019   TRIG 338.0 (H) 09/27/2019   HDL 28.90 (L) 09/27/2019   LDLDIRECT 37.0 09/27/2019   LDLCALC 17 03/17/2017   ALT 13 09/27/2019   AST 12 09/27/2019   NA 137 09/27/2019   K 4.0 09/27/2019   CL 104 09/27/2019   CREATININE 1.47 09/27/2019   BUN 27 (H) 09/27/2019   CO2 26 09/27/2019   TSH 2.25 09/27/2019   PSA 1.19 03/17/2017   INR 2.0 (H) 09/07/2019   HGBA1C 7.3 (H) 09/27/2019   MICROALBUR 15.9 (H) 09/27/2019    Assessment & Plan:   Problem List Items Addressed This Visit      Unprioritized   Erectile dysfunction - Primary   Relevant Orders   Testos,Total,Free and SHBG (Male)   Fatigue    Etiology unclear.  Will screen for thyroid, anemia,  hepatic and renal insufficiency, testosterone deficiency,  And encourage regular exercise 5 days /week,  consider repeating sleep study if labs are normal       Hyperlipidemia   Hyperlipidemia associated with type 2 diabetes mellitus (HCC)   Hypertension    .Well controlled on current regimen. Renal function stable, no changes today.  Lab Results  Component Value Date   CREATININE 1.47 09/27/2019   Lab Results  Component Value Date   NA 137 09/27/2019   K 4.0 09/27/2019   CL 104 09/27/2019   CO2 26 09/27/2019         Long term current use of anticoagulant therapy    Nephropathy, diabetic (HCC)     Patient has  microalbuminuria. Patient is tolerating statin therapy for CAD risk reduction and on ACE/ARB for  microalbuminuria and  hypertension   Lab Results  Component Value Date   HGBA1C 7.3 (H) 09/27/2019   Lab Results  Component Value Date   MICROALBUR 15.9 (H) 09/27/2019         OSA (obstructive sleep apnea)    diagnosed with prior sleep study but treatment has been deferred d by patient.  Discussed the long term history of OSA , the risks of long term damage to heart and the signs and symptoms attributable to OSA.  Advised patient to consider  significant weight loss and/or use of CPAP.       Type 2 diabetes mellitus with  microalbuminuria (HCC)    Loss of control and weight gain since getting remarried and stopping Januvia. .  hemoglobin A1c has risen to 7.3 . Patient is referred for  eye exam and foot exam was done today.  There is  minimal  proteinuria on prior micro urinalysis .  Advised to start exercsing.   Lab Results  Component Value Date   HGBA1C 7.3 (H) 09/27/2019   Lab Results  Component Value Date   MICROALBUR 15.9 (H) 09/27/2019             I am having Pamelia Hoit B. Suzie Portela "[name]" maintain his hydrochlorothiazide, allopurinol, metFORMIN, atorvastatin, losartan, glipiZIDE, and warfarin.  No orders of the defined types were placed in this encounter.   There are no discontinued medications.  Follow-up: No follow-ups on file.   Sherlene Shams, MD

## 2019-09-27 NOTE — Assessment & Plan Note (Signed)
.  Well controlled on current regimen. Renal function stable, no changes today.  Lab Results  Component Value Date   CREATININE 1.47 09/27/2019   Lab Results  Component Value Date   NA 137 09/27/2019   K 4.0 09/27/2019   CL 104 09/27/2019   CO2 26 09/27/2019

## 2019-09-27 NOTE — Assessment & Plan Note (Signed)
Etiology unclear.  Will screen for thyroid, anemia,  hepatic and renal insufficiency, testosterone deficiency,  And encourage regular exercise 5 days /week,  consider repeating sleep study if labs are normal

## 2019-09-27 NOTE — Patient Instructions (Addendum)
You are overdue for a diabetic eye exam.  Please schedule ASAP   You have gained 16 lbs since youR last visit   Cut down on your :   Biscuits   Waffles   Increase your exercise . Resume your walking

## 2019-09-27 NOTE — Assessment & Plan Note (Signed)
diagnosed with prior sleep study but treatment has been deferred d by patient.  Discussed the long term history of OSA , the risks of long term damage to heart and the signs and symptoms attributable to OSA.  Advised patient to consider  significant weight loss and/or use of CPAP.  

## 2019-09-29 ENCOUNTER — Other Ambulatory Visit: Payer: Self-pay

## 2019-09-29 MED ORDER — HYDROCHLOROTHIAZIDE 25 MG PO TABS: 25.0000 mg | ORAL_TABLET | Freq: Every day | ORAL | 1 refills | Status: DC

## 2019-09-29 MED ORDER — WARFARIN SODIUM 3 MG PO TABS: 3.0000 mg | ORAL_TABLET | Freq: Every day | ORAL | 1 refills | Status: DC

## 2019-09-29 MED ORDER — ALLOPURINOL 300 MG PO TABS: 300.0000 mg | ORAL_TABLET | Freq: Every day | ORAL | 1 refills | Status: DC

## 2019-10-02 DIAGNOSIS — D0439 Carcinoma in situ of skin of other parts of face: Secondary | ICD-10-CM | POA: Diagnosis not present

## 2019-10-02 LAB — TESTOS,TOTAL,FREE AND SHBG (FEMALE)
Free Testosterone: 79.4 pg/mL (ref 30.0–135.0)
Sex Hormone Binding: 19 nmol/L — ABNORMAL LOW (ref 22–77)
Testosterone, Total, LC-MS-MS: 373 ng/dL (ref 250–1100)

## 2019-12-07 ENCOUNTER — Other Ambulatory Visit: Payer: Self-pay

## 2019-12-07 MED ORDER — METFORMIN HCL 850 MG PO TABS: 850.0000 mg | ORAL_TABLET | Freq: Two times a day (BID) | ORAL | 1 refills | Status: DC

## 2019-12-22 ENCOUNTER — Other Ambulatory Visit: Payer: Self-pay | Admitting: Internal Medicine

## 2019-12-22 ENCOUNTER — Other Ambulatory Visit: Payer: Self-pay

## 2019-12-22 MED ORDER — GLIPIZIDE ER 2.5 MG PO TB24: 2.5000 mg | ORAL_TABLET | Freq: Every day | ORAL | 1 refills | Status: DC

## 2019-12-23 ENCOUNTER — Other Ambulatory Visit: Payer: Self-pay | Admitting: Internal Medicine

## 2020-01-16 ENCOUNTER — Telehealth: Payer: Self-pay | Admitting: *Deleted

## 2020-01-16 DIAGNOSIS — D6862 Lupus anticoagulant syndrome: Secondary | ICD-10-CM

## 2020-01-16 DIAGNOSIS — R809 Proteinuria, unspecified: Secondary | ICD-10-CM

## 2020-01-16 DIAGNOSIS — E1169 Type 2 diabetes mellitus with other specified complication: Secondary | ICD-10-CM

## 2020-01-16 DIAGNOSIS — E1129 Type 2 diabetes mellitus with other diabetic kidney complication: Secondary | ICD-10-CM

## 2020-01-16 DIAGNOSIS — Z7901 Long term (current) use of anticoagulants: Secondary | ICD-10-CM

## 2020-01-16 DIAGNOSIS — E785 Hyperlipidemia, unspecified: Secondary | ICD-10-CM

## 2020-01-16 NOTE — Telephone Encounter (Signed)
Please place future orders for lab appt.  

## 2020-01-16 NOTE — Telephone Encounter (Signed)
Fasting labs and INR ordered

## 2020-01-18 ENCOUNTER — Other Ambulatory Visit (INDEPENDENT_AMBULATORY_CARE_PROVIDER_SITE_OTHER): Payer: Medicare Other

## 2020-01-18 ENCOUNTER — Other Ambulatory Visit: Payer: Self-pay

## 2020-01-18 DIAGNOSIS — E785 Hyperlipidemia, unspecified: Secondary | ICD-10-CM

## 2020-01-18 DIAGNOSIS — E1169 Type 2 diabetes mellitus with other specified complication: Secondary | ICD-10-CM

## 2020-01-18 DIAGNOSIS — R809 Proteinuria, unspecified: Secondary | ICD-10-CM | POA: Diagnosis not present

## 2020-01-18 DIAGNOSIS — E1129 Type 2 diabetes mellitus with other diabetic kidney complication: Secondary | ICD-10-CM

## 2020-01-18 DIAGNOSIS — Z7901 Long term (current) use of anticoagulants: Secondary | ICD-10-CM

## 2020-01-18 LAB — COMPREHENSIVE METABOLIC PANEL
ALT: 11 U/L (ref 0–53)
AST: 12 U/L (ref 0–37)
Albumin: 4 g/dL (ref 3.5–5.2)
Alkaline Phosphatase: 45 U/L (ref 39–117)
BUN: 22 mg/dL (ref 6–23)
CO2: 27 mEq/L (ref 19–32)
Calcium: 9.4 mg/dL (ref 8.4–10.5)
Chloride: 103 mEq/L (ref 96–112)
Creatinine, Ser: 1.57 mg/dL — ABNORMAL HIGH (ref 0.40–1.50)
GFR: 42.89 mL/min — ABNORMAL LOW (ref >60.00)
Glucose, Bld: 156 mg/dL — ABNORMAL HIGH (ref 70–99)
Potassium: 4.1 mEq/L (ref 3.5–5.1)
Sodium: 137 mEq/L (ref 135–145)
Total Bilirubin: 0.6 mg/dL (ref 0.2–1.2)
Total Protein: 6.7 g/dL (ref 6.0–8.3)

## 2020-01-18 LAB — LDL CHOLESTEROL, DIRECT: Direct LDL: 44 mg/dL

## 2020-01-18 LAB — LIPID PANEL
Cholesterol: 111 mg/dL (ref 0–200)
HDL: 27.6 mg/dL — ABNORMAL LOW (ref >39.00)
NonHDL: 83.24
Total CHOL/HDL Ratio: 4
Triglycerides: 347 mg/dL — ABNORMAL HIGH (ref 0.0–149.0)
VLDL: 69.4 mg/dL — ABNORMAL HIGH (ref 0.0–40.0)

## 2020-01-18 LAB — HEMOGLOBIN A1C: Hgb A1c MFr Bld: 7.4 % — ABNORMAL HIGH (ref 4.6–6.5)

## 2020-01-18 LAB — PROTIME-INR
INR: 3.1 ratio — ABNORMAL HIGH (ref 0.8–1.0)
Prothrombin Time: 34.4 s — ABNORMAL HIGH (ref 9.6–13.1)

## 2020-01-21 ENCOUNTER — Other Ambulatory Visit: Payer: Self-pay | Admitting: Internal Medicine

## 2020-01-21 DIAGNOSIS — E1129 Type 2 diabetes mellitus with other diabetic kidney complication: Secondary | ICD-10-CM

## 2020-01-21 MED ORDER — GLIPIZIDE ER 5 MG PO TB24
5.0000 mg | ORAL_TABLET | Freq: Every day | ORAL | 0 refills | Status: DC
Start: 2020-01-21 — End: 2020-07-15

## 2020-01-21 NOTE — Assessment & Plan Note (Signed)
Will increase glipizide dose to 5 mg daily (XL)  Lab Results  Component Value Date   HGBA1C 7.4 (H) 01/18/2020

## 2020-02-02 ENCOUNTER — Telehealth: Payer: Self-pay | Admitting: *Deleted

## 2020-02-02 DIAGNOSIS — Z7901 Long term (current) use of anticoagulants: Secondary | ICD-10-CM

## 2020-02-02 NOTE — Telephone Encounter (Signed)
Please place future orders for lab appt.  

## 2020-02-07 ENCOUNTER — Other Ambulatory Visit (INDEPENDENT_AMBULATORY_CARE_PROVIDER_SITE_OTHER): Payer: Medicare Other

## 2020-02-07 ENCOUNTER — Other Ambulatory Visit: Payer: Self-pay

## 2020-02-07 DIAGNOSIS — Z7901 Long term (current) use of anticoagulants: Secondary | ICD-10-CM | POA: Diagnosis not present

## 2020-02-07 LAB — PROTIME-INR
INR: 3.5 ratio — ABNORMAL HIGH (ref 0.8–1.0)
Prothrombin Time: 38.9 s — ABNORMAL HIGH (ref 9.6–13.1)

## 2020-02-07 MED ORDER — WARFARIN SODIUM 2 MG PO TABS: 2.0000 mg | ORAL_TABLET | Freq: Every day | ORAL | 3 refills | Status: DC

## 2020-02-07 NOTE — Addendum Note (Signed)
Addended by: Sherlene Shams on: 02/07/2020 01:19 PM   Modules accepted: Orders

## 2020-02-08 DIAGNOSIS — D2261 Melanocytic nevi of right upper limb, including shoulder: Secondary | ICD-10-CM | POA: Diagnosis not present

## 2020-02-08 DIAGNOSIS — D225 Melanocytic nevi of trunk: Secondary | ICD-10-CM | POA: Diagnosis not present

## 2020-02-08 DIAGNOSIS — D2262 Melanocytic nevi of left upper limb, including shoulder: Secondary | ICD-10-CM | POA: Diagnosis not present

## 2020-02-08 DIAGNOSIS — Z85828 Personal history of other malignant neoplasm of skin: Secondary | ICD-10-CM | POA: Diagnosis not present

## 2020-02-22 ENCOUNTER — Other Ambulatory Visit: Payer: Medicare Other

## 2020-02-23 ENCOUNTER — Other Ambulatory Visit (INDEPENDENT_AMBULATORY_CARE_PROVIDER_SITE_OTHER): Payer: Medicare Other

## 2020-02-23 ENCOUNTER — Other Ambulatory Visit: Payer: Self-pay

## 2020-02-23 DIAGNOSIS — Z7901 Long term (current) use of anticoagulants: Secondary | ICD-10-CM

## 2020-02-23 LAB — PROTIME-INR
INR: 2.2 ratio — ABNORMAL HIGH (ref 0.8–1.0)
Prothrombin Time: 24.1 s — ABNORMAL HIGH (ref 9.6–13.1)

## 2020-02-23 NOTE — Progress Notes (Signed)
Orders for future labs  °

## 2020-02-28 DIAGNOSIS — C44622 Squamous cell carcinoma of skin of right upper limb, including shoulder: Secondary | ICD-10-CM | POA: Diagnosis not present

## 2020-03-04 ENCOUNTER — Encounter: Payer: Medicare Other | Admitting: Internal Medicine

## 2020-03-21 ENCOUNTER — Other Ambulatory Visit: Payer: Self-pay | Admitting: Internal Medicine

## 2020-03-28 ENCOUNTER — Other Ambulatory Visit: Payer: Self-pay

## 2020-03-28 ENCOUNTER — Other Ambulatory Visit (INDEPENDENT_AMBULATORY_CARE_PROVIDER_SITE_OTHER): Payer: Medicare Other

## 2020-03-28 DIAGNOSIS — Z7901 Long term (current) use of anticoagulants: Secondary | ICD-10-CM | POA: Diagnosis not present

## 2020-03-28 LAB — PROTIME-INR
INR: 2.3 ratio — ABNORMAL HIGH (ref 0.8–1.0)
Prothrombin Time: 26 s — ABNORMAL HIGH (ref 9.6–13.1)

## 2020-03-29 ENCOUNTER — Ambulatory Visit (INDEPENDENT_AMBULATORY_CARE_PROVIDER_SITE_OTHER): Payer: Medicare Other

## 2020-03-29 VITALS — Ht 67.0 in | Wt 196.0 lb

## 2020-03-29 DIAGNOSIS — Z0000 Encounter for general adult medical examination without abnormal findings: Secondary | ICD-10-CM | POA: Diagnosis not present

## 2020-03-29 NOTE — Patient Instructions (Addendum)
[name] , Thank you for taking time to come for your Medicare Wellness Visit. I appreciate your ongoing commitment to your health goals. Please review the following plan we discussed and let me know if I can assist you in the future.   These are the goals we discussed: Goals    . Increase physical activity     Walk at the park for 3 days a week for 30 minutes       This is a list of the screening recommended for you and due dates:  Health Maintenance  Topic Date Due  .  Hepatitis C: One time screening is recommended by Center for Disease Control  (CDC) for  adults born from [birth year] through 1964.   Never done  . Eye exam for diabetics  11/24/2018  . Complete foot exam   03/28/2020  . Flu Shot  04/03/2020*  . Hemoglobin A1C  07/20/2020  . Tetanus Vaccine  04/28/2021  . COVID-19 Vaccine  Completed  . Pneumonia vaccines  Completed  *Topic was postponed. The date shown is not the original due date.    Immunizations Immunization History  Administered Date(s) Administered  . Influenza Split 03/02/2012  . Influenza, High Dose Seasonal PF 03/12/2014, 03/11/2015, 03/11/2016, 03/17/2017, 03/24/2018  . Influenza,inj,Quad PF,6+ Mos 02/20/2013  . Influenza,inj,quad, With Preservative 03/17/2017  . Influenza-Unspecified 04/09/2019  . PFIZER SARS-COV-2 Vaccination 06/01/2019, 06/22/2019  . Pneumococcal Conjugate-13 09/06/2013  . Pneumococcal Polysaccharide-23 04/22/2011  . Pneumococcal-Unspecified 02/23/2016  . Tdap 04/29/2011  . Zoster 03/23/2014  . Zoster Recombinat (Shingrix) 07/08/2017, 09/28/2017   Keep all routine maintenance appointments.   Cpe 04/03/20 @ 9:00  Advanced directives: End of life planning; Advance aging; Advanced directives discussed.  Copy of current HCPOA/Living Will requested.    Conditions/risks identified: none new  Follow up in one year for your annual wellness visit.   Preventive Care 78 Years and Older, Male Preventive care refers to lifestyle choices  and visits with your health care provider that can promote health and wellness. What does preventive care include?  A yearly physical exam. This is also called an annual well check.  Dental exams once or twice a year.  Routine eye exams. Ask your health care provider how often you should have your eyes checked.  Personal lifestyle choices, including:  Daily care of your teeth and gums.  Regular physical activity.  Eating a healthy diet.  Avoiding tobacco and drug use.  Limiting alcohol use.  Practicing safe sex.  Taking low doses of aspirin every day.  Taking vitamin and mineral supplements as recommended by your health care provider. What happens during an annual well check? The services and screenings done by your health care provider during your annual well check will depend on your age, overall health, lifestyle risk factors, and family history of disease. Counseling  Your health care provider may ask you questions about your:  Alcohol use.  Tobacco use.  Drug use.  Emotional well-being.  Home and relationship well-being.  Sexual activity.  Eating habits.  History of falls.  Memory and ability to understand (cognition).  Work and work Astronomer. Screening  You may have the following tests or measurements:  Height, weight, and BMI.  Blood pressure.  Lipid and cholesterol levels. These may be checked every 5 years, or more frequently if you are over 13 years old.  Skin check.  Lung cancer screening. You may have this screening every year starting at age 27 if you have a 30-pack-year history of  smoking and currently smoke or have quit within the past 15 years.  Fecal occult blood test (FOBT) of the stool. You may have this test every year starting at age 22.  Flexible sigmoidoscopy or colonoscopy. You may have a sigmoidoscopy every 5 years or a colonoscopy every 10 years starting at age 14.  Prostate cancer screening. Recommendations will vary  depending on your family history and other risks.  Hepatitis C blood test.  Hepatitis B blood test.  Sexually transmitted disease (STD) testing.  Diabetes screening. This is done by checking your blood sugar (glucose) after you have not eaten for a while (fasting). You may have this done every 1-3 years.  Abdominal aortic aneurysm (AAA) screening. You may need this if you are a current or former smoker.  Osteoporosis. You may be screened starting at age 70 if you are at high risk. Talk with your health care provider about your test results, treatment options, and if necessary, the need for more tests. Vaccines  Your health care provider may recommend certain vaccines, such as:  Influenza vaccine. This is recommended every year.  Tetanus, diphtheria, and acellular pertussis (Tdap, Td) vaccine. You may need a Td booster every 10 years.  Zoster vaccine. You may need this after age 65.  Pneumococcal 13-valent conjugate (PCV13) vaccine. One dose is recommended after age 3.  Pneumococcal polysaccharide (PPSV23) vaccine. One dose is recommended after age 73. Talk to your health care provider about which screenings and vaccines you need and how often you need them. This information is not intended to replace advice given to you by your health care provider. Make sure you discuss any questions you have with your health care provider. Document Released: 06/07/2015 Document Revised: 01/29/2016 Document Reviewed: 03/12/2015 Elsevier Interactive Patient Education  2017 [employer].  Fall Prevention in the Home Falls can cause injuries. They can happen to people of all ages. There are many things you can do to make your home safe and to help prevent falls. What can I do on the outside of my home?  Regularly fix the edges of walkways and driveways and fix any cracks.  Remove anything that might make you trip as you walk through a door, such as a raised step or threshold.  Trim any bushes  or trees on the path to your home.  Use bright outdoor lighting.  Clear any walking paths of anything that might make someone trip, such as rocks or tools.  Regularly check to see if handrails are loose or broken. Make sure that both sides of any steps have handrails.  Any raised decks and porches should have guardrails on the edges.  Have any leaves, snow, or ice cleared regularly.  Use sand or salt on walking paths during winter.  Clean up any spills in your garage right away. This includes oil or grease spills. What can I do in the bathroom?  Use night lights.  Install grab bars by the toilet and in the tub and shower. Do not use towel bars as grab bars.  Use non-skid mats or decals in the tub or shower.  If you need to sit down in the shower, use a plastic, non-slip stool.  Keep the floor dry. Clean up any water that spills on the floor as soon as it happens.  Remove soap buildup in the tub or shower regularly.  Attach bath mats securely with double-sided non-slip rug tape.  Do not have throw rugs and other things on the floor that  can make you trip. What can I do in the bedroom?  Use night lights.  Make sure that you have a light by your bed that is easy to reach.  Do not use any sheets or blankets that are too big for your bed. They should not hang down onto the floor.  Have a firm chair that has side arms. You can use this for support while you get dressed.  Do not have throw rugs and other things on the floor that can make you trip. What can I do in the kitchen?  Clean up any spills right away.  Avoid walking on wet floors.  Keep items that you use a lot in easy-to-reach places.  If you need to reach something above you, use a strong step stool that has a grab bar.  Keep electrical cords out of the way.  Do not use floor polish or wax that makes floors slippery. If you must use wax, use non-skid floor wax.  Do not have throw rugs and other things on  the floor that can make you trip. What can I do with my stairs?  Do not leave any items on the stairs.  Make sure that there are handrails on both sides of the stairs and use them. Fix handrails that are broken or loose. Make sure that handrails are as long as the stairways.  Check any carpeting to make sure that it is firmly attached to the stairs. Fix any carpet that is loose or worn.  Avoid having throw rugs at the top or bottom of the stairs. If you do have throw rugs, attach them to the floor with carpet tape.  Make sure that you have a light switch at the top of the stairs and the bottom of the stairs. If you do not have them, ask someone to add them for you. What else can I do to help prevent falls?  Wear shoes that:  Do not have high heels.  Have rubber bottoms.  Are comfortable and fit you well.  Are closed at the toe. Do not wear sandals.  If you use a stepladder:  Make sure that it is fully opened. Do not climb a closed stepladder.  Make sure that both sides of the stepladder are locked into place.  Ask someone to hold it for you, if possible.  Clearly mark and make sure that you can see:  Any grab bars or handrails.  First and last steps.  Where the edge of each step is.  Use tools that help you move around (mobility aids) if they are needed. These include:  Canes.  Walkers.  Scooters.  Crutches.  Turn on the lights when you go into a dark area. Replace any light bulbs as soon as they burn out.  Set up your furniture so you have a clear path. Avoid moving your furniture around.  If any of your floors are uneven, fix them.  If there are any pets around you, be aware of where they are.  Review your medicines with your doctor. Some medicines can make you feel dizzy. This can increase your chance of falling. Ask your doctor what other things that you can do to help prevent falls. This information is not intended to replace advice given to you by  your health care provider. Make sure you discuss any questions you have with your health care provider. Document Released: 03/07/2009 Document Revised: 10/17/2015 Document Reviewed: 06/15/2014 Elsevier Interactive Patient Education  2017 [employer].

## 2020-03-29 NOTE — Progress Notes (Addendum)
Subjective:   Stephen Kelly is a 79 y.o. male who presents for Medicare Annual/Subsequent preventive examination.  Review of Systems    No ROS.  Medicare Wellness Virtual Visit.   Cardiac Risk Factors include: advanced age (>13men, >73 women);diabetes mellitus;male gender;hypertension     Objective:    Today's Vitals   03/29/20 1342  Weight: 196 lb (88.9 kg)  Height: 5\' 7"  (1.702 m)   Body mass index is 30.7 kg/m.  Advanced Directives 03/29/2020 01/17/2019 03/31/2016  Does Patient Have a Medical Advance Directive? Yes No No  Type of Advance Directive Healthcare Power of Attorney - -  Does patient want to make changes to medical advance directive? No - Patient declined - -  Copy of Healthcare Power of Attorney in Chart? No - copy requested - -  Would patient like information on creating a medical advance directive? - - Yes - Educational materials given    Current Medications (verified) Outpatient Encounter Medications as of 03/29/2020  Medication Sig   allopurinol (ZYLOPRIM) 300 MG tablet TAKE 1 TABLET(300 MG) BY MOUTH DAILY   atorvastatin (LIPITOR) 20 MG tablet TAKE 1 TABLET(20 MG) BY MOUTH DAILY   glipiZIDE (GLUCOTROL XL) 5 MG 24 hr tablet Take 1 tablet (5 mg total) by mouth daily with breakfast.   hydrochlorothiazide (HYDRODIURIL) 25 MG tablet TAKE 1 TABLET(25 MG) BY MOUTH DAILY   losartan (COZAAR) 100 MG tablet TAKE 1 TABLET(100 MG) BY MOUTH DAILY   metFORMIN (GLUCOPHAGE) 850 MG tablet TAKE 1 TABLET(850 MG) BY MOUTH TWICE DAILY WITH A MEAL   warfarin (COUMADIN) 2 MG tablet Take 1 tablet (2 mg total) by mouth daily.   warfarin (COUMADIN) 3 MG tablet TAKE 1 TABLET(3 MG) BY MOUTH DAILY   No facility-administered encounter medications on file as of 03/29/2020.    Allergies (verified) Azithromycin   History: Past Medical History:  Diagnosis Date   Clotting disorder (HCC) 2011   Lupus Inhibitor positive by March 2012 studies   Gout, arthritis    managed with allopurinol     History of renal calculi 2001   history of lithotripsy ,  no stent   Hyperlipidemia    Hypertension    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HCC)    per gitten's test March 2012   Patent foramen ovale    Pulmonary nodule, right 2011   unchanged, followed by gitten with serial ct   Past Surgical History:  Procedure Laterality Date   THROMBECTOMY     left brachicephalic artery   Family History  Problem Relation Age of Onset   Heart disease Mother    Hypertension Mother    Diabetes Mother    Cancer Mother        ovarian   Mental illness Father        suicide vs foul play   Stroke Paternal Grandmother    Social History   Socioeconomic History   Marital status: Widowed    Spouse name: Not on file   Number of children: Not on file   Years of education: Not on file   Highest education level: Not on file  Occupational History   Not on file  Tobacco Use   Smoking status: Never Smoker   Smokeless tobacco: Never Used  Substance and Sexual Activity   Alcohol use: No   Drug use: No   Sexual activity: Not Currently  Other Topics Concern   Not on file  Social History Narrative   Not on file   Social  Determinants of Health   Financial Resource Strain: Low Risk    Difficulty of Paying Living Expenses: Not hard at all  Food Insecurity: No Food Insecurity   Worried About Running Out of Food in the Last Year: Never true   Ran Out of Food in the Last Year: Never true  Transportation Needs: No Transportation Needs   Lack of Transportation (Medical): No   Lack of Transportation (Non-Medical): No  Physical Activity: Unknown   Days of Exercise per Week: 0 days   Minutes of Exercise per Session: Not on file  Stress: No Stress Concern Present   Feeling of Stress : Not at all  Social Connections: Unknown   Frequency of Communication with Friends and Family: Not on file   Frequency of Social Gatherings with Friends and Family: Not on file   Attends Religious Services: Not on  Scientist, clinical (histocompatibility and immunogenetics) or Organizations: Not on file   Attends Banker Meetings: Not on file   Marital Status: Married    Tobacco Counseling Counseling given: Not Answered   Clinical Intake:  Pre-visit preparation completed: Yes        Diabetes: Yes  How often do you need to have someone help you when you read instructions, pamphlets, or other written materials from your doctor or pharmacy?: 1 - Never  Nutrition Risk Assessment: Has the patient had any N/V/D within the last 2 months?  No  Does the patient have any non-healing wounds?  No  Has the patient had any unintentional weight loss or weight gain?  No   Diabetes: If diabetic, was a CBG obtained today?  No  Did the patient bring in their glucometer from home?  No . Virtual visit.  How often do you monitor your CBG's? none.   Financial Strains and Diabetes Management: Are you having any financial strains with the device, your supplies or your medication? No .  Does the patient want to be seen by Chronic Care Management for management of their diabetes?  No  Would the patient like to be referred to a Nutritionist or for Diabetic Management?  No    Interpreter Needed?: No      Activities of Daily Living In your present state of health, do you have any difficulty performing the following activities: 03/29/2020  Hearing? N  Vision? N  Difficulty concentrating or making decisions? N  Walking or climbing stairs? N  Dressing or bathing? N  Doing errands, shopping? N  Preparing Food and eating ? N  Using the Toilet? N  In the past six months, have you accidently leaked urine? N  Do you have problems with loss of bowel control? N  Managing your Medications? N  Managing your Finances? N  Housekeeping or managing your Housekeeping? N  Some recent data might be hidden    Patient Care Team: Sherlene Shams, MD as PCP - General (Internal Medicine)  Indicate any recent Medical Services you may  have received from other than [facility] providers in the past year (date may be approximate).     Assessment:   This is a routine wellness examination for Stephen Kelly.  I connected with Stephen Kelly today by telephone and verified that I am speaking with the correct person using two identifiers. Location patient: home Location provider: work Persons participating in the virtual visit: patient, Engineer, civil (consulting).    I discussed the limitations, risks, security and privacy concerns of performing an evaluation and management service by telephone and the availability  of in person appointments. The patient expressed understanding and verbally consented to this telephonic visit.    Interactive audio and video telecommunications were attempted between this provider and patient, however failed, due to patient having technical difficulties OR patient did not have access to video capability.  We continued and completed visit with audio only.  Some vital signs may be absent or patient reported.   Hearing/Vision screen  Hearing Screening   125Hz  250Hz  500Hz  1000Hz  2000Hz  3000Hz  4000Hz  6000Hz  8000Hz   Right ear:           Left ear:           Comments: Patient is able to hear conversational tones without difficulty.  No issues reported.  Vision Screening Comments: Wears corrective lenses Visual acuity not assessed, virtual visit.  They have seen their ophthalmologist in the last 12 months.     Dietary issues and exercise activities discussed: Current Exercise Habits: The patient does not participate in regular exercise at present  Goals      Increase physical activity     Walk at the park for 3 days a week for 30 minutes       Depression Screen PHQ 2/9 Scores 03/29/2020 09/27/2019 07/02/2017 03/31/2016 03/13/2014 12/08/2013 03/08/2013  PHQ - 2 Score 0 0 0 0 0 0 0  PHQ- 9 Score - - 1 - - - -    Fall Risk Fall Risk  03/29/2020 09/27/2019 03/29/2019 07/02/2017 03/31/2016  Falls in the past year? 0 0 1 No No  Number falls in past  yr: 0 - 0 - -  Injury with Fall? - - 0 - -  Risk for fall due to : - - History of fall(s) - -  Follow up Falls evaluation completed Falls evaluation completed Falls evaluation completed - -   Handrails in use when climbing stairs? Yes Home free of loose throw rugs in walkways, pet beds, electrical cords, etc? Yes  Adequate lighting in your home to reduce risk of falls? Yes   ASSISTIVE DEVICES UTILIZED TO PREVENT FALLS: Use of a cane, walker or w/c? No   TIMED UP AND GO: Was the test performed? No . Virtual visit.   Cognitive Function: MMSE - Mini Mental State Exam 03/31/2016  Orientation to time 5  Orientation to Place 5  Registration 3  Attention/ Calculation 5  Recall 3  Language- name 2 objects 2  Language- repeat 1  Language- follow 3 step command 3  Language- read & follow direction 1  Write a sentence 1  Copy design 1  Total score 30     6CIT Screen 03/29/2020 03/31/2016  What Year? 0 points 0 points  What month? 0 points 0 points  What time? 0 points 0 points  Count back from 20 0 points 0 points  Months in reverse 0 points 0 points  Repeat phrase - 0 points  Total Score - 0    Immunizations Immunization History  Administered Date(s) Administered   Influenza Split 03/02/2012   Influenza, High Dose Seasonal PF 03/12/2014, 03/11/2015, 03/11/2016, 03/17/2017, 03/24/2018   Influenza,inj,Quad PF,6+ Mos 02/20/2013   Influenza,inj,quad, With Preservative 03/17/2017   Influenza-Unspecified 04/09/2019   PFIZER SARS-COV-2 Vaccination 06/01/2019, 06/22/2019   Pneumococcal Conjugate-13 09/06/2013   Pneumococcal Polysaccharide-23 04/22/2011   Pneumococcal-Unspecified 02/23/2016   Tdap 04/29/2011   Zoster 03/23/2014   Zoster Recombinat (Shingrix) 07/08/2017, 09/28/2017   Health Maintenance Health Maintenance  Topic Date Due   Hepatitis C Screening  Never done   OPHTHALMOLOGY  EXAM  11/24/2018   FOOT EXAM  03/28/2020   INFLUENZA VACCINE  04/03/2020 (Originally  12/24/2019)   HEMOGLOBIN A1C  07/20/2020   TETANUS/TDAP  04/28/2021   COVID-19 Vaccine  Completed   PNA vac Low Risk Adult  Completed   Lung Cancer Screening: (Low Dose CT Chest recommended if Age 45-80 years, 30 pack-year currently smoking OR have quit w/in 15years.) does not qualify.   Hepatitis C Screening: deferred per patient request.     Dental Screening: Recommended annual dental exams for proper oral hygiene. Visits every 6 months.   Community Resource Referral / Chronic Care Management: CRR required this visit?  No   CCM required this visit?  No      Plan:   Keep all routine maintenance appointments.   Cpe 04/03/20 @ 9:00  I have personally reviewed and noted the following in the patient's chart:   Medical and social history Use of alcohol, tobacco or illicit drugs  Current medications and supplements Functional ability and status Nutritional status Physical activity Advanced directives List of other physicians Hospitalizations, surgeries, and ER visits in previous 12 months Vitals Screenings to include cognitive, depression, and falls Referrals and appointments  In addition, I have reviewed and discussed with patient certain preventive protocols, quality metrics, and best practice recommendations. A written personalized care plan for preventive services as well as general preventive health recommendations were provided to patient via mail.     OBrien-Blaney, Naudia Crosley L, LPN   [id number]/[id number]/2019     I have reviewed the above information and agree with above.   Duncan Dulleresa Tullo, MD

## 2020-04-03 ENCOUNTER — Other Ambulatory Visit: Payer: Self-pay

## 2020-04-03 ENCOUNTER — Encounter: Payer: Self-pay | Admitting: Internal Medicine

## 2020-04-03 ENCOUNTER — Ambulatory Visit (INDEPENDENT_AMBULATORY_CARE_PROVIDER_SITE_OTHER): Payer: Medicare Other | Admitting: Internal Medicine

## 2020-04-03 VITALS — BP 136/84 | HR 83 | Temp 97.8°F | Resp 15 | Ht 67.0 in | Wt 207.6 lb

## 2020-04-03 DIAGNOSIS — G4733 Obstructive sleep apnea (adult) (pediatric): Secondary | ICD-10-CM | POA: Diagnosis not present

## 2020-04-03 DIAGNOSIS — E785 Hyperlipidemia, unspecified: Secondary | ICD-10-CM

## 2020-04-03 DIAGNOSIS — Z299 Encounter for prophylactic measures, unspecified: Secondary | ICD-10-CM

## 2020-04-03 DIAGNOSIS — E1129 Type 2 diabetes mellitus with other diabetic kidney complication: Secondary | ICD-10-CM

## 2020-04-03 DIAGNOSIS — E538 Deficiency of other specified B group vitamins: Secondary | ICD-10-CM | POA: Diagnosis not present

## 2020-04-03 DIAGNOSIS — Z1159 Encounter for screening for other viral diseases: Secondary | ICD-10-CM

## 2020-04-03 DIAGNOSIS — D689 Coagulation defect, unspecified: Secondary | ICD-10-CM

## 2020-04-03 DIAGNOSIS — E1169 Type 2 diabetes mellitus with other specified complication: Secondary | ICD-10-CM | POA: Diagnosis not present

## 2020-04-03 DIAGNOSIS — D6862 Lupus anticoagulant syndrome: Secondary | ICD-10-CM

## 2020-04-03 DIAGNOSIS — E1121 Type 2 diabetes mellitus with diabetic nephropathy: Secondary | ICD-10-CM

## 2020-04-03 DIAGNOSIS — Z0000 Encounter for general adult medical examination without abnormal findings: Secondary | ICD-10-CM | POA: Diagnosis not present

## 2020-04-03 DIAGNOSIS — R809 Proteinuria, unspecified: Secondary | ICD-10-CM

## 2020-04-03 DIAGNOSIS — D6869 Other thrombophilia: Secondary | ICD-10-CM | POA: Diagnosis not present

## 2020-04-03 DIAGNOSIS — Z7901 Long term (current) use of anticoagulants: Secondary | ICD-10-CM

## 2020-04-03 DIAGNOSIS — I10 Essential (primary) hypertension: Secondary | ICD-10-CM

## 2020-04-03 NOTE — Progress Notes (Signed)
Patient ID: Stephen Kelly, male    DOB: 1941/05/24  Age: 79 y.o. MRN: [id number]  The patient is here for annual preventive examination and management of other chronic and acute problems.  This visit occurred during the SARS-CoV-2 public health emergency..  Safety protocols were in place, including screening questions prior to the visit, additional usage of staff PPE, and extensive cleaning of exam room while observing appropriate contact time as indicated for disinfecting solutions.    Patient has received 3 doses of the available COVID 19 vaccine without complications.  Patient continues to mask when outside of the home except when walking in yard or at safe distances from others .  Patient denies any change in mood or development of unhealthy behaviors resuting from the pandemic's restriction of activities and socialization.      The risk factors are reflected in the social history.  The roster of all physicians providing medical care to patient - is listed in the Snapshot section of the chart.  Activities of daily living:  The patient is 100% independent in all ADLs: dressing, toileting, feeding as well as independent mobility  Home safety : The patient has smoke detectors in the home. They wear seatbelts.  There are no firearms at home. There is no violence in the home.   There is no risks for hepatitis, STDs or HIV. There is no   history of blood transfusion. They have no travel history to infectious disease endemic areas of the world.  The patient has seen their dentist in the last six month. They have seen their eye doctor in the last year. They admit to slight hearing difficulty with regard to whispered voices and some television programs.  They have deferred audiologic testing in the last year.  They do not  have excessive sun exposure. Discussed the need for sun protection: hats, long sleeves and use of sunscreen if there is significant sun exposure.   Diet: the importance of a healthy  diet is discussed. They do have a healthy diet.  The benefits of regular aerobic exercise were discussed. He  walks 4 times per week ,  20 minutes.   Depression screen: there are no signs or vegative symptoms of depression- irritability, change in appetite, anhedonia, sadness/tearfullness.  Cognitive assessment: the patient manages all their financial and personal affairs and is actively engaged. They could relate day,date,year and events; recalled 2/3 objects at 3 minutes; performed clock-face test normally.  The following portions of the patient's history were reviewed and updated as appropriate: allergies, current medications, past family history, past medical history,  past surgical history, past social history  and problem list.  Visual acuity was not assessed per patient preference since she has regular follow up with her ophthalmologist. Hearing and body mass index were assessed and reviewed.   During the course of the visit the patient was educated and counseled about appropriate screening and preventive services including : fall prevention , diabetes screening, nutrition counseling, colorectal cancer screening, and recommended immunizations.    CC: The primary encounter diagnosis was Hyperlipidemia associated with type 2 diabetes mellitus (HCC). Diagnoses of OSA (obstructive sleep apnea), B12 deficiency, Acquired thrombophilia (HCC), Type 2 diabetes mellitus with microalbuminuria, without long-term current use of insulin (HCC), Anticoagulation monitoring, INR range 2-3, Clotting disorder (HCC), Encounter for preventive measure, Primary hypertension, 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HCC), Diabetic nephropathy associated with type 2 diabetes mellitus (HCC), and Encounter for hepatitis C screening test for low risk patient were also pertinent to  this visit.  1) acquired thrombophilia:  On warfarin INR 2.3 nov 4.  Denies any recent falls, bleeding spells   2) T2DM::  Overdue for eye  exam BUT believes it was done last year.  Told he had a floater I right eye, and early cataract which is unchanged    3) hypertension :  : patient checks blood pressure twice weekly at home.  Readings have been for the most part < 140/80 at rest . Patient is following a reduced salt diet most days and is taking medications as prescribed  4) OSA/obesity:  Weight gain of 11 lbs NOT ACCURATE. There has been NO CHANGE SINCE MAY VISIT .  Not wearing CPAP due to intolerance  .  Sleeping well without it   5) Dyspnea:  No longer doing yardwork.  Gets SOB walking around [employer] with mask on ,  But denies dyspnea  not at home.   [name] has a past medical history of Clotting disorder (HCC) (2011), Gout, arthritis, History of renal calculi (2001), Hyperlipidemia, Hypertension, 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HCC), Patent foramen ovale, and Pulmonary nodule, right (2011).   He has a past surgical history that includes Thrombectomy.   His family history includes Cancer in his mother; Diabetes in his mother; Heart disease in his mother; Hypertension in his mother; Mental illness in his father; Stroke in his paternal grandmother.He reports that he has never smoked. He has never used smokeless tobacco. He reports that he does not drink alcohol and does not use drugs.  Outpatient Medications Prior to Visit  Medication Sig Dispense Refill  . allopurinol (ZYLOPRIM) 300 MG tablet TAKE 1 TABLET(300 MG) BY MOUTH DAILY 90 tablet 1  . atorvastatin (LIPITOR) 20 MG tablet TAKE 1 TABLET(20 MG) BY MOUTH DAILY 90 tablet 1  . glipiZIDE (GLUCOTROL XL) 5 MG 24 hr tablet Take 1 tablet (5 mg total) by mouth daily with breakfast. 90 tablet 0  . hydrochlorothiazide (HYDRODIURIL) 25 MG tablet TAKE 1 TABLET(25 MG) BY MOUTH DAILY 90 tablet 1  . losartan (COZAAR) 100 MG tablet TAKE 1 TABLET(100 MG) BY MOUTH DAILY 90 tablet 1  . metFORMIN (GLUCOPHAGE) 850 MG tablet TAKE 1 TABLET(850 MG) BY MOUTH TWICE DAILY WITH A MEAL  180 tablet 1  . warfarin (COUMADIN) 2 MG tablet Take 1 tablet (2 mg total) by mouth daily. 30 tablet 3  . warfarin (COUMADIN) 3 MG tablet TAKE 1 TABLET(3 MG) BY MOUTH DAILY 90 tablet 1  . glipiZIDE (GLUCOTROL XL) 2.5 MG 24 hr tablet Take 2.5 mg by mouth daily. (Patient not taking: Reported on 04/03/2020)     No facility-administered medications prior to visit.    Review of Systems   Patient denies headache, fevers, malaise, unintentional weight loss, skin rash, eye pain, sinus congestion and sinus pain, sore throat, dysphagia,  hemoptysis , cough,, wheezing, chest pain, palpitations, orthopnea, edema, abdominal pain, nausea, melena, diarrhea, constipation, flank pain, dysuria, hematuria, urinary  Frequency, nocturia, numbness, tingling, seizures,  Focal weakness, Loss of consciousness,  Tremor, insomnia, depression, anxiety, and suicidal ideation.      Objective:  BP 136/84 (BP Location: Left Arm, Patient Position: Sitting, Cuff Size: Normal)   Pulse 83   Temp 97.8 F (36.6 C) (Oral)   Resp 15   Ht 5\' 7"  (1.702 m)   Wt 207 lb 9.6 oz (94.2 kg)   SpO2 94%   BMI 32.51 kg/m   Physical Exam  General appearance: alert, cooperative and appears stated age Ears: normal TM's  and external ear canals both ears Throat: lips, mucosa, and tongue normal; teeth and gums normal Neck: no adenopathy, no carotid bruit, supple, symmetrical, trachea midline and thyroid not enlarged, symmetric, no tenderness/mass/nodules Back: symmetric, no curvature. ROM normal. No CVA tenderness. Lungs: clear to auscultation bilaterally Heart: regular rate and rhythm, S1, S2 normal, no murmur, click, rub or gallop Abdomen: soft, non-tender; bowel sounds normal; no masses,  no organomegaly Pulses: 2+ and symmetric Skin: Skin color, texture, turgor normal. No rashes or lesions Lymph nodes: Cervical, supraclavicular, and axillary nodes normal.  Assessment & Plan:   Problem List Items Addressed This Visit       Unprioritized   Acquired thrombophilia (HCC)    secondary to use of  Warfarin FOR HISTORY OF BRACHIOCEPHALIC DVT  secondaryt and lupus anticoagulant inhibitor syndrome.  . Patient has no signs of bleeding and is advised to notify his specialists prior to any procedure that may required suspension of warfarin.  Monthly INRs have been stable.       Relevant Orders   Protime-INR   Anticoagulation monitoring, INR range 2-3    INR has been at goal on current dose for the last 2 months    Lab Results  Component Value Date   INR 2.3 (H) 03/28/2020   INR 2.2 (H) 02/23/2020   INR 3.5 (H) 02/07/2020         Clotting disorder (HCC)   Encounter for preventive measure    age appropriate education and counseling updated, referrals for preventative services and immunizations addressed, dietary and smoking counseling addressed, most recent labs reviewed.  I have personally reviewed and have noted:  1) the patient's medical and social history 2) The pt's use of alcohol, tobacco, and illicit drugs 3) The patient's current medications and supplements 4) Functional ability including ADL's, fall risk, home safety risk, hearing and visual impairment 5) Diet and physical activities 6) Evidence for depression or mood disorder 7) The patient's height, weight, and BMI have been recorded in the chart  I have made referrals, and provided counseling and education based on review of the above      Hyperlipidemia associated with type 2 diabetes mellitus (HCC) - Primary    LDL is  < 70  with 20 mg atorvastatin . LFTs are normal and he is tolerating therapy.   no changes today,   Lab Results  Component Value Date   CHOL 111 01/18/2020   HDL 27.60 (L) 01/18/2020   LDLCALC 17 03/17/2017   LDLDIRECT 44.0 01/18/2020   TRIG 347.0 (H) 01/18/2020   CHOLHDL 4 01/18/2020   Lab Results  Component Value Date   ALT 11 01/18/2020   AST 12 01/18/2020   ALKPHOS 45 01/18/2020   BILITOT 0.6 01/18/2020              Relevant Orders   Lipid panel   Hypertension    Well controlled on current regimen. Renal function stable, no changes today.  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  Continue lifelong anticoagulation      Nephropathy, diabetic (HCC)     Patient has  microalbuminuria. Patient is tolerating ARB for  microalbuminuria and  hypertension   Lab Results  Component Value Date   HGBA1C 7.4 (H) 01/18/2020   Lab Results  Component Value Date   MICROALBUR 15.9 (H) 09/27/2019         OSA (obstructive sleep apnea)    diagnosed with prior sleep study but treatment has been continually deferred  by patient.  Discussed the long term history of OSA , the risks of long term damage to heart and the signs and symptoms attributable to OSA.  Advised patient to consider  significant weight loss and/or use of CPAP.       Type 2 diabetes mellitus with microalbuminuria (HCC)    At last visit his glipizide dose was increased to  to 5 mg daily (XL) .  He denies any hypoglycemic events.  Repeat labs due in late November  Lab Results  Component Value Date   HGBA1C 7.4 (H) 01/18/2020         Relevant Orders   Hemoglobin A1c   Microalbumin / creatinine urine ratio   Comprehensive metabolic panel    Other Visit Diagnoses    B12 deficiency       Relevant Orders   Vitamin B12   Encounter for hepatitis C screening test for low risk patient       Relevant Orders   Hepatitis C antibody      I am having Pamelia Hoit B. Suzie Portela "[name]" maintain his losartan, atorvastatin, glipiZIDE, warfarin, allopurinol, hydrochlorothiazide, metFORMIN, and warfarin.  No orders of the defined types were placed in this  encounter.   Medications Discontinued During This Encounter  Medication Reason  . glipiZIDE (GLUCOTROL XL) 2.5 MG 24 hr tablet     Follow-up: Return in about 3 months (around 07/04/2020) for follow up diabetes.   Sherlene Shams, MD

## 2020-04-03 NOTE — Patient Instructions (Addendum)
Eat more salads and green vegetables!  Less corn  Less potatoes ,  Rice and bread.  Limit starches to 2 servings DAILY    Check sugar 2 hours after a favorite meal..  Should be < 160 to keep eating on regular basis   Eggs and breakfast meat with 1 slice toast is ok  RESUME YOUR DAILY WALK  RETURN FOR SCHEDULED LABS ON NOV 29    Health Maintenance After Age 78 After age 74, you are at a higher risk for certain long-term diseases and infections as well as injuries from falls. Falls are a major cause of broken bones and head injuries in people who are older than age 74. Getting regular preventive care can help to keep you healthy and well. Preventive care includes getting regular testing and making lifestyle changes as recommended by your health care provider. Talk with your health care provider about:  Which screenings and tests you should have. A screening is a test that checks for a disease when you have no symptoms.  A diet and exercise plan that is right for you. What should I know about screenings and tests to prevent falls? Screening and testing are the best ways to find a health problem early. Early diagnosis and treatment give you the best chance of managing medical conditions that are common after age 96. Certain conditions and lifestyle choices may make you more likely to have a fall. Your health care provider may recommend:  Regular vision checks. Poor vision and conditions such as cataracts can make you more likely to have a fall. If you wear glasses, make sure to get your prescription updated if your vision changes.  Medicine review. Work with your health care provider to regularly review all of the medicines you are taking, including over-the-counter medicines. Ask your health care provider about any side effects that may make you more likely to have a fall. Tell your health care provider if any medicines that you take make you feel dizzy or sleepy.  Osteoporosis screening.  Osteoporosis is a condition that causes the bones to get weaker. This can make the bones weak and cause them to break more easily.  Blood pressure screening. Blood pressure changes and medicines to control blood pressure can make you feel dizzy.  Strength and balance checks. Your health care provider may recommend certain tests to check your strength and balance while standing, walking, or changing positions.  Foot health exam. Foot pain and numbness, as well as not wearing proper footwear, can make you more likely to have a fall.  Depression screening. You may be more likely to have a fall if you have a fear of falling, feel emotionally low, or feel unable to do activities that you used to do.  Alcohol use screening. Using too much alcohol can affect your balance and may make you more likely to have a fall. What actions can I take to lower my risk of falls? General instructions  Talk with your health care provider about your risks for falling. Tell your health care provider if: ? You fall. Be sure to tell your health care provider about all falls, even ones that seem minor. ? You feel dizzy, sleepy, or off-balance.  Take over-the-counter and prescription medicines only as told by your health care provider. These include any supplements.  Eat a healthy diet and maintain a healthy weight. A healthy diet includes low-fat dairy products, low-fat (lean) meats, and fiber from whole grains, beans, and lots of fruits  and vegetables. Home safety  Remove any tripping hazards, such as rugs, cords, and clutter.  Install safety equipment such as grab bars in bathrooms and safety rails on stairs.  Keep rooms and walkways well-lit. Activity   Follow a regular exercise program to stay fit. This will help you maintain your balance. Ask your health care provider what types of exercise are appropriate for you.  If you need a cane or walker, use it as recommended by your health care provider.  Wear  supportive shoes that have nonskid soles. Lifestyle  Do not drink alcohol if your health care provider tells you not to drink.  If you drink alcohol, limit how much you have: ? 0-1 drink a day for women. ? 0-2 drinks a day for men.  Be aware of how much alcohol is in your drink. In the [place] one drink equals one typical bottle of beer (12 oz), one-half glass of wine (5 oz), or one shot of hard liquor (1 oz).  Do not use any products that contain nicotine or tobacco, such as cigarettes and e-cigarettes. If you need help quitting, ask your health care provider. Summary  Having a healthy lifestyle and getting preventive care can help to protect your health and wellness after age 8.  Screening and testing are the best way to find a health problem early and help you avoid having a fall. Early diagnosis and treatment give you the best chance for managing medical conditions that are more common for people who are older than age 68.  Falls are a major cause of broken bones and head injuries in people who are older than age 78. Take precautions to prevent a fall at home.  Work with your health care provider to learn what changes you can make to improve your health and wellness and to prevent falls. This information is not intended to replace advice given to you by your health care provider. Make sure you discuss any questions you have with your health care provider. Document Revised: 09/01/2018 Document Reviewed: 03/24/2017 Elsevier Patient Education  2020 [employer].

## 2020-04-05 ENCOUNTER — Encounter: Payer: Self-pay | Admitting: Internal Medicine

## 2020-04-05 DIAGNOSIS — D6869 Other thrombophilia: Secondary | ICD-10-CM | POA: Insufficient documentation

## 2020-04-05 NOTE — Assessment & Plan Note (Signed)
LDL is  < 70  with 20 mg atorvastatin . LFTs are normal and he is tolerating therapy.   no changes today,   Lab Results  Component Value Date   CHOL 111 01/18/2020   HDL 27.60 (L) 01/18/2020   LDLCALC 17 03/17/2017   LDLDIRECT 44.0 01/18/2020   TRIG 347.0 (H) 01/18/2020   CHOLHDL 4 01/18/2020   Lab Results  Component Value Date   ALT 11 01/18/2020   AST 12 01/18/2020   ALKPHOS 45 01/18/2020   BILITOT 0.6 01/18/2020

## 2020-04-05 NOTE — Assessment & Plan Note (Addendum)
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  . Patient has no signs of bleeding and is advised to notify his specialists prior to any procedure that may required suspension of warfarin.  Monthly INRs have been stable.

## 2020-04-05 NOTE — Assessment & Plan Note (Signed)
INR has been at goal on current dose for the last 2 months    Lab Results  Component Value Date   INR 2.3 (H) 03/28/2020   INR 2.2 (H) 02/23/2020   INR 3.5 (H) 02/07/2020

## 2020-04-05 NOTE — Assessment & Plan Note (Signed)
diagnosed with prior sleep study but treatment has been continually deferred  by patient.  Discussed the long term history of OSA , the risks of long term damage to heart and the signs and symptoms attributable to OSA.  Advised patient to consider  significant weight loss and/or use of CPAP.  

## 2020-04-05 NOTE — Assessment & Plan Note (Signed)
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  Continue lifelong anticoagulation 

## 2020-04-05 NOTE — Assessment & Plan Note (Signed)

## 2020-04-05 NOTE — Assessment & Plan Note (Signed)
Patient has  microalbuminuria. Patient is tolerating ARB for  microalbuminuria and  hypertension   Lab Results  Component Value Date   HGBA1C 7.4 (H) 01/18/2020   Lab Results  Component Value Date   MICROALBUR 15.9 (H) 09/27/2019

## 2020-04-05 NOTE — Assessment & Plan Note (Signed)
Well controlled on current regimen. Renal function stable, no changes today.  Lab Results  Component Value Date   CREATININE 1.57 (H) 01/18/2020   Lab Results  Component Value Date   NA 137 01/18/2020   K 4.1 01/18/2020   CL 103 01/18/2020   CO2 27 01/18/2020

## 2020-04-05 NOTE — Assessment & Plan Note (Signed)
At last visit his glipizide dose was increased to  to 5 mg daily (XL) .  He denies any hypoglycemic events.  Repeat labs due in late November  Lab Results  Component Value Date   HGBA1C 7.4 (H) 01/18/2020

## 2020-04-22 ENCOUNTER — Other Ambulatory Visit (INDEPENDENT_AMBULATORY_CARE_PROVIDER_SITE_OTHER): Payer: Medicare Other

## 2020-04-22 ENCOUNTER — Other Ambulatory Visit: Payer: Self-pay

## 2020-04-22 DIAGNOSIS — R809 Proteinuria, unspecified: Secondary | ICD-10-CM

## 2020-04-22 DIAGNOSIS — Z1159 Encounter for screening for other viral diseases: Secondary | ICD-10-CM

## 2020-04-22 DIAGNOSIS — E1129 Type 2 diabetes mellitus with other diabetic kidney complication: Secondary | ICD-10-CM | POA: Diagnosis not present

## 2020-04-22 DIAGNOSIS — D6869 Other thrombophilia: Secondary | ICD-10-CM

## 2020-04-22 DIAGNOSIS — E538 Deficiency of other specified B group vitamins: Secondary | ICD-10-CM

## 2020-04-22 DIAGNOSIS — E785 Hyperlipidemia, unspecified: Secondary | ICD-10-CM

## 2020-04-22 DIAGNOSIS — E1169 Type 2 diabetes mellitus with other specified complication: Secondary | ICD-10-CM | POA: Diagnosis not present

## 2020-04-22 LAB — COMPREHENSIVE METABOLIC PANEL
ALT: 13 U/L (ref 0–53)
AST: 13 U/L (ref 0–37)
Albumin: 4.3 g/dL (ref 3.5–5.2)
Alkaline Phosphatase: 44 U/L (ref 39–117)
BUN: 22 mg/dL (ref 6–23)
CO2: 29 mEq/L (ref 19–32)
Calcium: 10.5 mg/dL (ref 8.4–10.5)
Chloride: 100 mEq/L (ref 96–112)
Creatinine, Ser: 1.46 mg/dL (ref 0.40–1.50)
GFR: 45.7 mL/min — ABNORMAL LOW (ref >60.00)
Glucose, Bld: 184 mg/dL — ABNORMAL HIGH (ref 70–99)
Potassium: 4.3 mEq/L (ref 3.5–5.1)
Sodium: 137 mEq/L (ref 135–145)
Total Bilirubin: 0.6 mg/dL (ref 0.2–1.2)
Total Protein: 6.8 g/dL (ref 6.0–8.3)

## 2020-04-22 LAB — LDL CHOLESTEROL, DIRECT: Direct LDL: 41 mg/dL

## 2020-04-22 LAB — LIPID PANEL
Cholesterol: 108 mg/dL (ref 0–200)
HDL: 28.6 mg/dL — ABNORMAL LOW (ref >39.00)
NonHDL: 79.49
Total CHOL/HDL Ratio: 4
Triglycerides: 393 mg/dL — ABNORMAL HIGH (ref 0.0–149.0)
VLDL: 78.6 mg/dL — ABNORMAL HIGH (ref 0.0–40.0)

## 2020-04-22 LAB — PROTIME-INR
INR: 1.7 ratio — ABNORMAL HIGH (ref 0.8–1.0)
Prothrombin Time: 19.1 s — ABNORMAL HIGH (ref 9.6–13.1)

## 2020-04-22 LAB — VITAMIN B12: Vitamin B-12: 250 pg/mL (ref 211–911)

## 2020-04-22 LAB — HEMOGLOBIN A1C: Hgb A1c MFr Bld: 8 % — ABNORMAL HIGH (ref 4.6–6.5)

## 2020-04-23 LAB — HEPATITIS C ANTIBODY
Hepatitis C Ab: NONREACTIVE
SIGNAL TO CUT-OFF: 0.01 (ref <1.00)

## 2020-04-23 LAB — MICROALBUMIN / CREATININE URINE RATIO
Creatinine,U: 77.5 mg/dL
Microalb Creat Ratio: 31.4 mg/g — ABNORMAL HIGH (ref 0.0–30.0)
Microalb, Ur: 24.3 mg/dL — ABNORMAL HIGH (ref 0.0–1.9)

## 2020-04-24 ENCOUNTER — Other Ambulatory Visit: Payer: Self-pay | Admitting: Internal Medicine

## 2020-04-24 ENCOUNTER — Encounter: Payer: Self-pay | Admitting: Internal Medicine

## 2020-04-24 DIAGNOSIS — Z7901 Long term (current) use of anticoagulants: Secondary | ICD-10-CM

## 2020-04-24 DIAGNOSIS — E538 Deficiency of other specified B group vitamins: Secondary | ICD-10-CM | POA: Insufficient documentation

## 2020-04-24 NOTE — Progress Notes (Signed)
INR is too low.  Increase coumadin dose to 3 mg daily all days.  Recheck 2 weeks.   B12 is low.  Needs to start b12 injections weekly and get additional labs to determine his longterm management.  Kidney function is low but stable/unchanged  A1c is too high, and  triglycerides too .  Needs appt to discuss management

## 2020-05-08 ENCOUNTER — Encounter: Payer: Self-pay | Admitting: Internal Medicine

## 2020-05-08 ENCOUNTER — Ambulatory Visit: Payer: Medicare Other | Admitting: Internal Medicine

## 2020-05-08 ENCOUNTER — Other Ambulatory Visit: Payer: Self-pay

## 2020-05-08 VITALS — BP 122/78 | HR 94 | Temp 97.9°F | Ht 67.0 in | Wt 204.0 lb

## 2020-05-08 DIAGNOSIS — Z7901 Long term (current) use of anticoagulants: Secondary | ICD-10-CM

## 2020-05-08 DIAGNOSIS — E1121 Type 2 diabetes mellitus with diabetic nephropathy: Secondary | ICD-10-CM

## 2020-05-08 DIAGNOSIS — D6862 Lupus anticoagulant syndrome: Secondary | ICD-10-CM

## 2020-05-08 DIAGNOSIS — E538 Deficiency of other specified B group vitamins: Secondary | ICD-10-CM

## 2020-05-08 LAB — PROTIME-INR
INR: 2.3 ratio — ABNORMAL HIGH (ref 0.8–1.0)
Prothrombin Time: 25.5 s — ABNORMAL HIGH (ref 9.6–13.1)

## 2020-05-08 LAB — VITAMIN B12: Vitamin B-12: >1526 pg/mL — ABNORMAL HIGH (ref 211–911)

## 2020-05-08 MED ORDER — CYANOCOBALAMIN 1000 MCG/ML IJ SOLN
1000.0000 ug | Freq: Once | INTRAMUSCULAR | Status: AC
  Administered 2020-05-08: 1000 ug via INTRAMUSCULAR

## 2020-05-08 NOTE — Patient Instructions (Addendum)
Your diabetes is under  diminishing control on current regimen, and your a1c has increased to 8.0 (goal is <, 7.00).     Please Check your sugars once daily for the next two weeks at any of the following times:   fasting   Or postprandial (2 hr after a meal) at  Different times/meals  .  Use the sheet to record your sugars and your meal (if you checked it AFTER a meal) and drop it by so I can review them.     Continue both the metformin twice daily and the glucotrol once daily for now   Return the sheets in 2 weeks    Type 2 Diabetes Mellitus, Self Care, Adult When you have type 2 diabetes (type 2 diabetes mellitus), you must make sure your blood sugar (glucose) stays in a healthy range. You can do this with:  Nutrition.  Exercise.  Lifestyle changes.  Medicines or insulin, if needed.  Support from your doctors and others. How to stay aware of blood sugar   Check your blood sugar level every day, as often as told.  Have your A1c (hemoglobin A1c) level checked two or more times a year. Have it checked more often if your doctor tells you to. Your doctor will set personal treatment goals for you. Generally, you should have these blood sugar levels:  Before meals (preprandial): 80-130 mg/dL (4.4-7.2 mmol/L).  After meals (postprandial): below 180 mg/dL (10 mmol/L).  A1c level: less than 7%. How to manage high and low blood sugar Signs of high blood sugar High blood sugar is called hyperglycemia. Know the signs of high blood sugar. Signs may include:  Feeling: ? Thirsty. ? Hungry. ? Very tired.  Needing to pee (urinate) more than usual.  Blurry vision. Signs of low blood sugar Low blood sugar is called hypoglycemia. This is when blood sugar is at or below 70 mg/dL (3.9 mmol/L). Signs may include:  Feeling: ? Hungry. ? Worried or nervous (anxious). ? Sweaty and clammy. ? Confused. ? Dizzy. ? Sleepy. ? Sick to your stomach (nauseous).  Having: ? A fast  heartbeat. ? A headache. ? A change in your vision. ? Jerky movements that you cannot control (seizure). ? Tingling or no feeling (numbness) around your mouth, lips, or tongue.  Having trouble with: ? Moving (coordination). ? Sleeping. ? Passing out (fainting). ? Getting upset easily (irritability). Treating low blood sugar To treat low blood sugar, eat or drink something sugary right away. If you can think clearly and swallow safely, follow the 15:15 rule:  Take 15 grams of a fast-acting carb (carbohydrate). Talk with your doctor about how much you should take.  Some fast-acting carbs are: ? Sugar tablets (glucose pills). Take 3-4 pills. ? 6-8 pieces of hard candy. ? 4-6 oz (120-150 mL) of fruit juice. ? 4-6 oz (120-150 mL) of regular (not diet) soda. ? 1 Tbsp (15 mL) honey or sugar.  Check your blood sugar 15 minutes after you take the carb.  If your blood sugar is still at or below 70 mg/dL (3.9 mmol/L), take 15 grams of a carb again.  If your blood sugar does not go above 70 mg/dL (3.9 mmol/L) after 3 tries, get help right away.  After your blood sugar goes back to normal, eat a meal or a snack within 1 hour. Treating very low blood sugar If your blood sugar is at or below 54 mg/dL (3 mmol/L), you have very low blood sugar (severe hypoglycemia). This is  an emergency. Do not wait to see if the symptoms will go away. Get medical help right away. Call your local emergency services (911 in the [place] If you have very low blood sugar and you cannot eat or drink, you may need a glucagon shot (injection). A family member or friend should learn how to check your blood sugar and how to give you a glucagon shot. Ask your doctor if you need to have a glucagon shot kit at home. Follow these instructions at home: Medicine  Take insulin and diabetes medicines as told.  If your doctor says you should take more or less insulin and medicines, do this exactly as told.  Do not run out of  insulin or medicines. Having diabetes can raise your risk for other long-term conditions. These include heart disease and kidney disease. Your doctor may prescribe medicines to help you not have these problems. Food   Make healthy food choices. These include: ? Chicken, fish, egg whites, and beans. ? Oats, whole wheat, bulgur, brown rice, quinoa, and millet. ? Fresh fruits and vegetables. ? Low-fat dairy products. ? Nuts, avocado, olive oil, and canola oil.  Meet with a food specialist (dietitian). He or she can help you make an eating plan that is right for you.  Follow instructions from your doctor about what you cannot eat or drink.  Drink enough fluid to keep your pee (urine) pale yellow.  Keep track of carbs that you eat. Do this by reading food labels and learning food serving sizes.  Follow your sick day plan when you cannot eat or drink normally. Make this plan with your doctor so it is ready to use. Activity  Exercise 3 or more times a week.  Do not go more than 2 days without exercising.  Talk with your doctor before you start a new exercise. Your doctor may need to tell you to change: ? How much insulin or medicines you take. ? How much food you eat. Lifestyle  Do not use any tobacco products. These include cigarettes, chewing tobacco, and e-cigarettes. If you need help quitting, ask your doctor.  Ask your doctor how much alcohol is safe for you.  Learn to deal with stress. If you need help with this, ask your doctor. Body care   Stay up to date with your shots (immunizations).  Have your eyes and feet checked by a doctor as often as told.  Check your skin and feet every day. Check for cuts, bruises, redness, blisters, or sores.  Brush your teeth and gums two times a day. Floss one or more times a day.  Go to the dentist one or more times every 6 months.  Stay at a healthy weight. General instructions  Take over-the-counter and prescription medicines  only as told by your doctor.  Share your diabetes care plan with: ? Your work or school. ? People you live with.  Carry a card or wear jewelry that says you have diabetes.  Keep all follow-up visits as told by your doctor. This is important. Questions to ask your doctor  Do I need to meet with a diabetes educator?  Where can I find a support group for people with diabetes? Where to find more information To learn more about diabetes, visit:  American Diabetes Association: www.diabetes.org  American Association of Diabetes Educators: www.diabeteseducator.org Summary  When you have type 2 diabetes, you must make sure your blood sugar (glucose) stays in a healthy range.  Check your blood sugar every  day, as often as told.  Having diabetes can raise your risk for other conditions. Your doctor may prescribe medicines to help you not have these problems.  Keep all follow-up visits as told by your doctor. This is important. This information is not intended to replace advice given to you by your health care provider. Make sure you discuss any questions you have with your health care provider. Document Revised: 11/01/2017 Document Reviewed: 06/14/2015 Elsevier Patient Education  2020 [employer]. .

## 2020-05-08 NOTE — Progress Notes (Signed)
Subjective:  Patient ID: Stephen Kelly, male    DOB: [birth year]  Age: 79 y.o. MRN: [id number]  CC: The primary encounter diagnosis was B12 deficiency. Diagnoses of Long term current use of anticoagulant therapy, 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HCC), Diabetic nephropathy associated with type 2 diabetes mellitus (HCC), and Anticoagulation monitoring, INR range 2-3 were also pertinent to this visit.  HPI Stephen Kelly presents for follow up on recent labs noting loss of glycemic control  This visit occurred during the SARS-CoV-2 public health emergency.  Safety protocols were in place, including screening questions prior to the visit, additional usage of staff PPE, and extensive cleaning of exam room while observing appropriate contact time as indicated for disinfecting solutions.   Type 2 DM:  taking metformin 850 mg bid and glucotrol xl 5 mg once daily.  Does not check sugars but has a glucometer.   Wife has type 2 diabetes .  No longer exercising regularly .    Hypertension: patient checks blood pressure twice weekly at home.  Readings have been for the most part < 140/80 at rest . Patient is following a reduce salt diet most days and is taking medications as prescribed (losartan 100 mg daily and hctz 25 mg daily ).  Acquired thrombophilia: he is taking coumadin and has not experienced bruising or bleeding   Outpatient Medications Prior to Visit  Medication Sig Dispense Refill  . allopurinol (ZYLOPRIM) 300 MG tablet TAKE 1 TABLET(300 MG) BY MOUTH DAILY 90 tablet 1  . atorvastatin (LIPITOR) 20 MG tablet TAKE 1 TABLET(20 MG) BY MOUTH DAILY 90 tablet 1  . glipiZIDE (GLUCOTROL XL) 5 MG 24 hr tablet Take 1 tablet (5 mg total) by mouth daily with breakfast. 90 tablet 0  . hydrochlorothiazide (HYDRODIURIL) 25 MG tablet TAKE 1 TABLET(25 MG) BY MOUTH DAILY 90 tablet 1  . losartan (COZAAR) 100 MG tablet TAKE 1 TABLET(100 MG) BY MOUTH DAILY 90 tablet 1  . metFORMIN (GLUCOPHAGE) 850 MG tablet TAKE  1 TABLET(850 MG) BY MOUTH TWICE DAILY WITH A MEAL 180 tablet 1  . warfarin (COUMADIN) 3 MG tablet TAKE 1 TABLET(3 MG) BY MOUTH DAILY 90 tablet 1  . warfarin (COUMADIN) 2 MG tablet Take 1 tablet (2 mg total) by mouth daily. (Patient not taking: Reported on 05/08/2020) 30 tablet 3   No facility-administered medications prior to visit.    Review of Systems;  Patient denies headache, fevers, malaise, unintentional weight loss, skin rash, eye pain, sinus congestion and sinus pain, sore throat, dysphagia,  hemoptysis , cough, dyspnea, wheezing, chest pain, palpitations, orthopnea, edema, abdominal pain, nausea, melena, diarrhea, constipation, flank pain, dysuria, hematuria, urinary  Frequency, nocturia, numbness, tingling, seizures,  Focal weakness, Loss of consciousness,  Tremor, insomnia, depression, anxiety, and suicidal ideation.      Objective:  BP 122/78 (BP Location: Left Arm, Patient Position: Sitting, Cuff Size: Normal)   Pulse 94   Temp 97.9 F (36.6 C) (Oral)   Ht 5\' 7"  (1.702 m)   Wt 204 lb (92.5 kg)   SpO2 95%   BMI 31.95 kg/m   BP Readings from Last 3 Encounters:  05/08/20 122/78  04/03/20 136/84  09/27/19 128/88    Wt Readings from Last 3 Encounters:  05/08/20 204 lb (92.5 kg)  04/03/20 207 lb 9.6 oz (94.2 kg)  03/29/20 196 lb (88.9 kg)    General appearance: alert, cooperative and appears stated age Ears: normal TM's and external ear canals both ears Throat: lips, mucosa, and tongue  normal; teeth and gums normal Neck: no adenopathy, no carotid bruit, supple, symmetrical, trachea midline and thyroid not enlarged, symmetric, no tenderness/mass/nodules Back: symmetric, no curvature. ROM normal. No CVA tenderness. Lungs: clear to auscultation bilaterally Heart: regular rate and rhythm, S1, S2 normal, no murmur, click, rub or gallop Abdomen: soft, non-tender; bowel sounds normal; no masses,  no organomegaly Pulses: 2+ and symmetric Skin: Skin color, texture, turgor  normal. No rashes or lesions Lymph nodes: Cervical, supraclavicular, and axillary nodes normal.  Lab Results  Component Value Date   HGBA1C 8.0 (H) 04/22/2020   HGBA1C 7.4 (H) 01/18/2020   HGBA1C 7.3 (H) 09/27/2019    Lab Results  Component Value Date   CREATININE 1.46 04/22/2020   CREATININE 1.57 (H) 01/18/2020   CREATININE 1.47 09/27/2019    Lab Results  Component Value Date   WBC 9.0 09/27/2019   HGB 12.9 (L) 09/27/2019   HCT 37.9 (L) 09/27/2019   PLT 233.0 09/27/2019   GLUCOSE 184 (H) 04/22/2020   CHOL 108 04/22/2020   TRIG 393.0 (H) 04/22/2020   HDL 28.60 (L) 04/22/2020   LDLDIRECT 41.0 04/22/2020   LDLCALC 17 03/17/2017   ALT 13 04/22/2020   AST 13 04/22/2020   NA 137 04/22/2020   K 4.3 04/22/2020   CL 100 04/22/2020   CREATININE 1.46 04/22/2020   BUN 22 04/22/2020   CO2 29 04/22/2020   TSH 2.25 09/27/2019   PSA 1.19 03/17/2017   INR 2.3 (H) 05/08/2020   HGBA1C 8.0 (H) 04/22/2020   MICROALBUR 24.3 (H) 04/22/2020    DG Chest 2 View  Result Date: 01/17/2019 CLINICAL DATA:  Shortness of breath. EXAM: CHEST - 2 VIEW COMPARISON:  12/01/2011 FINDINGS: The heart size and mediastinal contours are within normal limits. Both lungs are clear. The visualized skeletal structures are unremarkable. Aortic atherosclerosis. IMPRESSION: No active cardiopulmonary disease. Aortic atherosclerosis. Electronically Signed   By: Francene Boyers M.D.   On: 01/17/2019 11:07     Assessment & Plan:   Problem List Items Addressed This Visit      Unprioritized   Nephropathy, diabetic (HCC)    At last visit his glipizide dose was increased to  to 5 mg daily (XL) .  He denies any hypoglycemic events and his A1c has risen to 8.0 he has been advised to check post prandials sugars for the next two weeks  Lab Results  Component Value Date   HGBA1C 8.0 (H) 04/22/2020         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HCC)    Resulting in brachiocephalic artery thrombus requiring  thrombectomy.  Continue lifelong anticoagulation      Long term current use of anticoagulant therapy   Anticoagulation monitoring, INR range 2-3    Coumadin level is therapeutic, He has been advised to continue current regimen and repeat PT/INR in one month  Lab Results  Component Value Date   INR 2.3 (H) 05/08/2020   INR 1.7 (H) 04/22/2020   INR 2.3 (H) 03/28/2020        B12 deficiency - Primary    Intrinsic factor antibody positive.  B12 level is normal.   Lab Results  Component Value Date   GYIRSWNI62 >1526 (H) 05/08/2020        Relevant Orders   Vitamin B12 (Completed)      I am having Pamelia Hoit B. Suzie Portela "[name]" maintain his losartan, atorvastatin, glipiZIDE, allopurinol, hydrochlorothiazide, metFORMIN, and warfarin. We administered cyanocobalamin.  Meds ordered this encounter  Medications  . cyanocobalamin ((VITAMIN  B-12)) injection 1,000 mcg    Medications Discontinued During This Encounter  Medication Reason  . warfarin (COUMADIN) 2 MG tablet     Follow-up: Return in about 3 months (around 08/06/2020) for follow up diabetes.   Sherlene Shams, MD

## 2020-05-09 NOTE — Progress Notes (Signed)
  Your INR/coumadin level is therapeutic,  Continue current regimen and repeat PT/INR in one month.   

## 2020-05-10 ENCOUNTER — Other Ambulatory Visit: Payer: Self-pay

## 2020-05-10 DIAGNOSIS — Z7901 Long term (current) use of anticoagulants: Secondary | ICD-10-CM

## 2020-05-11 LAB — INTRINSIC FACTOR ANTIBODIES: Intrinsic Factor: POSITIVE — AB

## 2020-05-11 LAB — FOLATE RBC: RBC Folate: 577 ng/mL RBC (ref >280)

## 2020-05-11 NOTE — Assessment & Plan Note (Signed)
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  Continue lifelong anticoagulation 

## 2020-05-11 NOTE — Assessment & Plan Note (Signed)
Coumadin level is therapeutic, He has been advised to continue current regimen and repeat PT/INR in one month  Lab Results  Component Value Date   INR 2.3 (H) 05/08/2020   INR 1.7 (H) 04/22/2020   INR 2.3 (H) 03/28/2020

## 2020-05-11 NOTE — Assessment & Plan Note (Signed)
At last visit his glipizide dose was increased to  to 5 mg daily (XL) .  He denies any hypoglycemic events and his A1c has risen to 8.0 he has been advised to check post prandials sugars for the next two weeks  Lab Results  Component Value Date   HGBA1C 8.0 (H) 04/22/2020

## 2020-05-11 NOTE — Assessment & Plan Note (Addendum)
Intrinsic factor antibody positive.  B12 level is normal.   Lab Results  Component Value Date   VITAMINB12 >1526 (H) 05/08/2020

## 2020-06-11 ENCOUNTER — Telehealth: Payer: Self-pay

## 2020-06-12 ENCOUNTER — Other Ambulatory Visit: Payer: Medicare Other

## 2020-06-12 NOTE — Telephone Encounter (Signed)
error 

## 2020-06-13 ENCOUNTER — Other Ambulatory Visit (INDEPENDENT_AMBULATORY_CARE_PROVIDER_SITE_OTHER): Payer: Medicare Other

## 2020-06-13 ENCOUNTER — Telehealth: Payer: Self-pay | Admitting: Internal Medicine

## 2020-06-13 ENCOUNTER — Other Ambulatory Visit: Payer: Self-pay

## 2020-06-13 DIAGNOSIS — Z7901 Long term (current) use of anticoagulants: Secondary | ICD-10-CM | POA: Diagnosis not present

## 2020-06-13 LAB — PROTIME-INR
INR: 2.7 ratio — ABNORMAL HIGH (ref 0.8–1.0)
Prothrombin Time: 29.5 s — ABNORMAL HIGH (ref 9.6–13.1)

## 2020-06-13 NOTE — Telephone Encounter (Signed)
Patient dropped off blood sugar readings. Readings are up front in color folder

## 2020-06-13 NOTE — Progress Notes (Signed)
  Your INR/coumadin level is therapeutic,  Continue current regimen and repeat PT/INR in one month.   

## 2020-06-13 NOTE — Telephone Encounter (Signed)
Placed in yellow results folder.  °

## 2020-06-14 ENCOUNTER — Telehealth: Payer: Self-pay

## 2020-06-14 NOTE — Telephone Encounter (Signed)
LMTCB in regards to lab results.  

## 2020-06-16 NOTE — Telephone Encounter (Signed)
His sugars are all high fastings should be 120 or less and post prandials  160 or less.   I recommend adding a 3rd medication.  Please schedule appt to discuss.

## 2020-06-17 NOTE — Telephone Encounter (Signed)
Spoke with Stephen Kelly and informed him that his sugar readings are still too high and that Dr. Darrick Huntsman would like for him to schedule an appt to discuss the need for more medication. Stephen Kelly scheduled an appt.

## 2020-06-19 ENCOUNTER — Other Ambulatory Visit: Payer: Self-pay | Admitting: Internal Medicine

## 2020-06-24 ENCOUNTER — Other Ambulatory Visit: Payer: Self-pay | Admitting: Internal Medicine

## 2020-07-05 ENCOUNTER — Ambulatory Visit: Payer: Medicare Other | Admitting: Internal Medicine

## 2020-07-08 ENCOUNTER — Ambulatory Visit: Payer: Medicare Other | Admitting: Internal Medicine

## 2020-07-15 ENCOUNTER — Telehealth: Payer: Self-pay | Admitting: Internal Medicine

## 2020-07-15 ENCOUNTER — Other Ambulatory Visit: Payer: Self-pay

## 2020-07-15 ENCOUNTER — Encounter: Payer: Self-pay | Admitting: Internal Medicine

## 2020-07-15 ENCOUNTER — Telehealth: Payer: Self-pay

## 2020-07-15 ENCOUNTER — Ambulatory Visit (INDEPENDENT_AMBULATORY_CARE_PROVIDER_SITE_OTHER): Payer: Medicare Other | Admitting: Internal Medicine

## 2020-07-15 VITALS — BP 140/66 | HR 101 | Temp 97.3°F | Resp 15 | Ht 67.0 in | Wt 204.6 lb

## 2020-07-15 DIAGNOSIS — M130 Polyarthritis, unspecified: Secondary | ICD-10-CM

## 2020-07-15 DIAGNOSIS — I700 Atherosclerosis of aorta: Secondary | ICD-10-CM

## 2020-07-15 DIAGNOSIS — E1122 Type 2 diabetes mellitus with diabetic chronic kidney disease: Secondary | ICD-10-CM

## 2020-07-15 DIAGNOSIS — I6523 Occlusion and stenosis of bilateral carotid arteries: Secondary | ICD-10-CM | POA: Diagnosis not present

## 2020-07-15 DIAGNOSIS — Z7901 Long term (current) use of anticoagulants: Secondary | ICD-10-CM | POA: Diagnosis not present

## 2020-07-15 DIAGNOSIS — E1169 Type 2 diabetes mellitus with other specified complication: Secondary | ICD-10-CM

## 2020-07-15 DIAGNOSIS — E1129 Type 2 diabetes mellitus with other diabetic kidney complication: Secondary | ICD-10-CM | POA: Diagnosis not present

## 2020-07-15 DIAGNOSIS — R809 Proteinuria, unspecified: Secondary | ICD-10-CM

## 2020-07-15 DIAGNOSIS — E1121 Type 2 diabetes mellitus with diabetic nephropathy: Secondary | ICD-10-CM

## 2020-07-15 DIAGNOSIS — E785 Hyperlipidemia, unspecified: Secondary | ICD-10-CM

## 2020-07-15 DIAGNOSIS — N183 Chronic kidney disease, stage 3 unspecified: Secondary | ICD-10-CM | POA: Insufficient documentation

## 2020-07-15 DIAGNOSIS — D6869 Other thrombophilia: Secondary | ICD-10-CM

## 2020-07-15 LAB — PROTIME-INR
INR: 2.6 ratio — ABNORMAL HIGH (ref 0.8–1.0)
Prothrombin Time: 29.1 s — ABNORMAL HIGH (ref 9.6–13.1)

## 2020-07-15 MED ORDER — GLIPIZIDE ER 5 MG PO TB24: 5.0000 mg | ORAL_TABLET | Freq: Every day | ORAL | 0 refills | Status: DC

## 2020-07-15 NOTE — Telephone Encounter (Signed)
Pt wanted to let Dr. Darrick Huntsman know that he is taking Glipizide 2.5 and not 5

## 2020-07-15 NOTE — Chronic Care Management (AMB) (Signed)
  Chronic Care Management   Outreach Note  07/15/2020 Name: Stephen Kelly MRN: [id number] DOB: [birth year]  Stephen Kelly is a 80 y.o. year old male who is a primary care patient of Stephen Kelly, Stephen Daring, MD. I reached out to Stephen Kelly by phone today in response to a referral sent by [name]'s PCP, Stephen Shams, MD   An unsuccessful telephone outreach was attempted today. The patient was referred to the case management team for assistance with care management and care coordination.   Follow Up Plan: A HIPAA compliant phone message was left for the patient providing contact information and requesting a return call.  The care management team will reach out to the patient again over the next 7 days.  If patient returns call to provider office, please advise to call Embedded Care Management Care Guide Penne Lash  at [phone]  Penne Lash, RMA Care Guide, Embedded Care Coordination [facility]  [place], [place] [address] Direct Dial: [phone] [email or web address]

## 2020-07-15 NOTE — Assessment & Plan Note (Signed)
Inr  is therapeutic on 3 mg daily .  He has been advised to continue current regimen and repeat PT/INR in one month  Lab Results  Component Value Date   INR 2.6 (H) 07/15/2020   INR 2.7 (H) 06/13/2020   INR 2.3 (H) 05/08/2020

## 2020-07-15 NOTE — Assessment & Plan Note (Signed)
Slightly worse.  Continue losartan. Advised to check BP at home given goal of 130/80 or less   Lab Results  Component Value Date   CREATININE 1.46 04/22/2020   Lab Results  Component Value Date   MICROALBUR 24.3 (H) 04/22/2020   MICROALBUR 15.9 (H) 09/27/2019

## 2020-07-15 NOTE — Telephone Encounter (Signed)
Glipizide 5 mg has been sent to his pharmacy

## 2020-07-15 NOTE — Assessment & Plan Note (Signed)
Reviewed findings of prior CT scan from August 2020 .  Patient is tolerating atorvastatin 20 mg daily; last LDL was < 70  Lab Results  Component Value Date   CHOL 108 04/22/2020   HDL 28.60 (L) 04/22/2020   LDLCALC 17 03/17/2017   LDLDIRECT 41.0 04/22/2020   TRIG 393.0 (H) 04/22/2020   CHOLHDL 4 04/22/2020

## 2020-07-15 NOTE — Assessment & Plan Note (Signed)
Reminded to avoid NSAIDs;  He has been taking Naprosyn based on his questions today.

## 2020-07-15 NOTE — Assessment & Plan Note (Signed)
At last visit his glipizide dose was increased to  to 5 mg daily (XL)but he did not change.  Fasting sugars remain 40 pts above goal.  .  He denies any hypoglycemic events.  Will increase glipizide XL to 5 mg daily.  CCM referral to see if Catie can facilititate Jardiance rx.  Lab Results  Component Value Date   HGBA1C 8.0 (H) 04/22/2020

## 2020-07-15 NOTE — Progress Notes (Signed)
Subjective:  Patient ID: Stephen Kelly, male    DOB: 02-23-1941  Age: 80 y.o. MRN: [id number]  CC: The primary encounter diagnosis was Anticoagulation monitoring, INR range 2-3. Diagnoses of Hyperlipidemia associated with type 2 diabetes mellitus (HCC), Type 2 diabetes mellitus with microalbuminuria, without long-term current use of insulin (HCC), Carotid atherosclerosis, bilateral, Polyarthritis of multiple sites, Diabetic nephropathy associated with type 2 diabetes mellitus (HCC), Long term current use of anticoagulant therapy, Acquired thrombophilia (HCC), CKD stage 3 due to type 2 diabetes mellitus (HCC), and Aortic atherosclerosis (HCC) were also pertinent to this visit.  HPI Stephen Kelly presents for follow up on poorly controlled diabetes and other issues  This visit occurred during the SARS-CoV-2 public health emergency.  Safety protocols were in place, including screening questions prior to the visit, additional usage of staff PPE, and extensive cleaning of exam room while observing appropriate contact time as indicated for disinfecting solutions.   CKD stage 3: he is avoiding NSAIDs but may have been taking naprosyn.    Aortic atherosclerosis"  Reviewed findings of prior CT scan today..  Patient is tolerating high potency statin therapy   Type 2 DM :  Fasting 158 today,  Range by memory has been never lower,   Up to 170.  Did not increase the glipizide after last visit to 5 mg .  Still taking 2.5 mg daily.   Taking metformin as directed.  Not exercising: "too cold."  Weight gain addressed.  Reviewed other conditions, discussed benefit of adding jardiance or other med in same category.  Neither are affordable per attempts to prescribe today (I=Tier 4 or Not in formulary)    Outpatient Medications Prior to Visit  Medication Sig Dispense Refill  . allopurinol (ZYLOPRIM) 300 MG tablet TAKE 1 TABLET(300 MG) BY MOUTH DAILY 90 tablet 1  . atorvastatin (LIPITOR) 20 MG tablet TAKE 1 TABLET(20  MG) BY MOUTH DAILY 90 tablet 1  . hydrochlorothiazide (HYDRODIURIL) 25 MG tablet TAKE 1 TABLET(25 MG) BY MOUTH DAILY 90 tablet 1  . losartan (COZAAR) 100 MG tablet TAKE 1 TABLET(100 MG) BY MOUTH DAILY 90 tablet 1  . metFORMIN (GLUCOPHAGE) 850 MG tablet TAKE 1 TABLET(850 MG) BY MOUTH TWICE DAILY WITH A MEAL 180 tablet 1  . warfarin (COUMADIN) 3 MG tablet TAKE 1 TABLET(3 MG) BY MOUTH DAILY 90 tablet 1  . glipiZIDE (GLUCOTROL XL) 2.5 MG 24 hr tablet TAKE 1 TABLET(2.5 MG) BY MOUTH DAILY WITH BREAKFAST 90 tablet 1  . glipiZIDE (GLUCOTROL XL) 5 MG 24 hr tablet Take 1 tablet (5 mg total) by mouth daily with breakfast. (Patient not taking: Reported on 07/15/2020) 90 tablet 0   No facility-administered medications prior to visit.    Review of Systems;  Patient denies headache, fevers, malaise, unintentional weight loss, skin rash, eye pain, sinus congestion and sinus pain, sore throat, dysphagia,  hemoptysis , cough, dyspnea, wheezing, chest pain, palpitations, orthopnea, edema, abdominal pain, nausea, melena, diarrhea, constipation, flank pain, dysuria, hematuria, urinary  Frequency, nocturia, numbness, tingling, seizures,  Focal weakness, Loss of consciousness,  Tremor, insomnia, depression, anxiety, and suicidal ideation.      Objective:  BP 140/66 (BP Location: Left Arm, Patient Position: Sitting, Cuff Size: Normal)   Pulse (!) 101   Temp (!) 97.3 F (36.3 C) (Oral)   Resp 15   Ht  (1.702 m)   Wt 204 lb 9.6 oz (92.8 kg)   SpO2 96%   BMI 32.04 kg/m   BP Readings from  Last 3 Encounters:  07/15/20 140/66  05/08/20 122/78  04/03/20 136/84    Wt Readings from Last 3 Encounters:  07/15/20 204 lb 9.6 oz (92.8 kg)  05/08/20 204 lb (92.5 kg)  04/03/20 207 lb 9.6 oz (94.2 kg)    General appearance: alert, cooperative and appears stated age Ears: normal TM's and external ear canals both ears Throat: lips, mucosa, and tongue normal; teeth and gums normal Neck: no adenopathy, no carotid  bruit, supple, symmetrical, trachea midline and thyroid not enlarged, symmetric, no tenderness/mass/nodules Back: symmetric, no curvature. ROM normal. No CVA tenderness. Lungs: clear to auscultation bilaterally Heart: regular rate and rhythm, S1, S2 normal, no murmur, click, rub or gallop Abdomen: soft, non-tender; bowel sounds normal; no masses,  no organomegaly Pulses: 2+ and symmetric Skin: Skin color, texture, turgor normal. No rashes or lesions Lymph nodes: Cervical, supraclavicular, and axillary nodes normal.  Lab Results  Component Value Date   HGBA1C 8.0 (H) 04/22/2020   HGBA1C 7.4 (H) 01/18/2020   HGBA1C 7.3 (H) 09/27/2019    Lab Results  Component Value Date   CREATININE 1.46 04/22/2020   CREATININE 1.57 (H) 01/18/2020   CREATININE 1.47 09/27/2019    Lab Results  Component Value Date   WBC 9.0 09/27/2019   HGB 12.9 (L) 09/27/2019   HCT 37.9 (L) 09/27/2019   PLT 233.0 09/27/2019   GLUCOSE 184 (H) 04/22/2020   CHOL 108 04/22/2020   TRIG 393.0 (H) 04/22/2020   HDL 28.60 (L) 04/22/2020   LDLDIRECT 41.0 04/22/2020   LDLCALC 17 03/17/2017   ALT 13 04/22/2020   AST 13 04/22/2020   NA 137 04/22/2020   K 4.3 04/22/2020   CL 100 04/22/2020   CREATININE 1.46 04/22/2020   BUN 22 04/22/2020   CO2 29 04/22/2020   TSH 2.25 09/27/2019   PSA 1.19 03/17/2017   INR 2.6 (H) 07/15/2020   HGBA1C 8.0 (H) 04/22/2020   MICROALBUR 24.3 (H) 04/22/2020    DG Chest 2 View  Result Date: 01/17/2019 CLINICAL DATA:  Shortness of breath. EXAM: CHEST - 2 VIEW COMPARISON:  12/01/2011 FINDINGS: The heart size and mediastinal contours are within normal limits. Both lungs are clear. The visualized skeletal structures are unremarkable. Aortic atherosclerosis. IMPRESSION: No active cardiopulmonary disease. Aortic atherosclerosis. Electronically Signed   By: Francene Boyers M.D.   On: 01/17/2019 11:07   DG Lumbar Spine 2-3 Views  Result Date: 01/17/2019 CLINICAL DATA:  80 year old male with a  history of lumbar back pain EXAM: LUMBAR SPINE - 2-3 VIEW COMPARISON:  None. FINDINGS: Lumbar Spine: Lumbar vertebral elements maintain normal alignment without evidence of anterolisthesis, retrolisthesis, subluxation. No acute fracture line identified. Vertebral body heights maintained. Degenerative disc changes throughout the lumbar spine with the greatest degree of narrowing at the L5-S1 level. L5-S1 demonstrates sclerotic endplate changes and anterior osteophyte production. Lesser degree of anterior osteophyte production endplate sclerosis at L4-L5 and L2-L3. Facet hypertrophy of L4-L5 and L5-S1. Calcifications of the abdominal aorta. IMPRESSION: Negative for acute fracture or malalignment of the lumbar spine. Degenerative disc disease and facet disease most advanced at the L4-S1 levels. Aortic atherosclerosis Electronically Signed   By: Gilmer Mor D.O.   On: 01/17/2019 10:24   CT Head Wo Contrast  Result Date: 01/17/2019 CLINICAL DATA:  Larey Seat this morning striking back of head, small laceration, no loss of consciousness, history hypertension, clotting disorder EXAM: CT HEAD WITHOUT CONTRAST CT CERVICAL SPINE WITHOUT CONTRAST TECHNIQUE: Multidetector CT imaging of the head and cervical spine was performed  following the standard protocol without intravenous contrast. Multiplanar CT image reconstructions of the cervical spine were also generated. COMPARISON:  None Correlation: MR brain 04/15/2018 FINDINGS: CT HEAD FINDINGS Brain: Generalized atrophy. Normal ventricular morphology. No midline shift or mass effect. Small vessel chronic ischemic changes of deep cerebral white matter. No intracranial hemorrhage, mass lesion, evidence of acute infarction, or extra-axial fluid collection. Vascular: Atherosclerotic calcifications of internal carotid arteries at skull base. No hyperdense vessels. Skull: Calvaria intact. Small foci of soft tissue gas within posterior RIGHT parietal scalp Sinuses/Orbits: Clear Other:  N/A CT CERVICAL SPINE FINDINGS Alignment: Minimal anterolisthesis C4-C5. Minimal retrolisthesis C5-C6. Remaining alignment normal. Skull base and vertebrae: Skull base intact. Scattered disc space narrowing and endplate spur formation greatest at C5-C6 and C6-C7. Multilevel facet degenerative changes. Vertebral body heights maintained. No acute fracture, subluxation or bone destruction. Encroachment upon BILATERAL C5-C6 neural foramina by uncovertebral spurs Soft tissues and spinal canal: Prevertebral soft tissues normal thickness. Atherosclerotic calcifications at the carotid bifurcations. Disc levels:  No additional abnormalities Upper chest: Visualized lung apices clear Other: N/A IMPRESSION: Atrophy with small vessel chronic ischemic changes of deep cerebral white matter. No acute intracranial abnormalities. Multilevel degenerative disc and facet disease changes of the cervical spine. No acute cervical spine abnormalities. Atherosclerotic calcification at the carotid bifurcations bilaterally; recommend follow-up non emergent carotid duplex sonography assessment. Electronically Signed   By: Ulyses Southward M.D.   On: 01/17/2019 10:24   CT Cervical Spine Wo Contrast  Result Date: 01/17/2019 CLINICAL DATA:  Larey Seat this morning striking back of head, small laceration, no loss of consciousness, history hypertension, clotting disorder EXAM: CT HEAD WITHOUT CONTRAST CT CERVICAL SPINE WITHOUT CONTRAST TECHNIQUE: Multidetector CT imaging of the head and cervical spine was performed following the standard protocol without intravenous contrast. Multiplanar CT image reconstructions of the cervical spine were also generated. COMPARISON:  None Correlation: MR brain 04/15/2018 FINDINGS: CT HEAD FINDINGS Brain: Generalized atrophy. Normal ventricular morphology. No midline shift or mass effect. Small vessel chronic ischemic changes of deep cerebral white matter. No intracranial hemorrhage, mass lesion, evidence of acute  infarction, or extra-axial fluid collection. Vascular: Atherosclerotic calcifications of internal carotid arteries at skull base. No hyperdense vessels. Skull: Calvaria intact. Small foci of soft tissue gas within posterior RIGHT parietal scalp Sinuses/Orbits: Clear Other: N/A CT CERVICAL SPINE FINDINGS Alignment: Minimal anterolisthesis C4-C5. Minimal retrolisthesis C5-C6. Remaining alignment normal. Skull base and vertebrae: Skull base intact. Scattered disc space narrowing and endplate spur formation greatest at C5-C6 and C6-C7. Multilevel facet degenerative changes. Vertebral body heights maintained. No acute fracture, subluxation or bone destruction. Encroachment upon BILATERAL C5-C6 neural foramina by uncovertebral spurs Soft tissues and spinal canal: Prevertebral soft tissues normal thickness. Atherosclerotic calcifications at the carotid bifurcations. Disc levels:  No additional abnormalities Upper chest: Visualized lung apices clear Other: N/A IMPRESSION: Atrophy with small vessel chronic ischemic changes of deep cerebral white matter. No acute intracranial abnormalities. Multilevel degenerative disc and facet disease changes of the cervical spine. No acute cervical spine abnormalities. Atherosclerotic calcification at the carotid bifurcations bilaterally; recommend follow-up non emergent carotid duplex sonography assessment. Electronically Signed   By: Ulyses Southward M.D.   On: 01/17/2019 10:24   DG Hip Unilat W or Wo Pelvis 2-3 Views Right  Result Date: 01/17/2019 CLINICAL DATA:  80 year old male status post fall backwards this morning. EXAM: DG HIP (WITH OR WITHOUT PELVIS) 2-3V RIGHT COMPARISON:  Abdomen CT 06/04/2010. FINDINGS: Femoral heads are normally located. Bone mineralization is within normal limits for age.  Grossly intact proximal left femur. The pelvis appears intact. Normal SI joints. The proximal right femur appears intact. Negative lower abdominal and pelvic visceral contours. IMPRESSION:  No acute fracture or dislocation identified about the right hip or pelvis. Electronically Signed   By: Odessa Fleming M.D.   On: 01/17/2019 10:25    Assessment & Plan:   Problem List Items Addressed This Visit      Unprioritized   Type 2 diabetes mellitus with microalbuminuria (HCC)    At last visit his glipizide dose was increased to  to 5 mg daily (XL)but he did not change.  Fasting sugars remain 40 pts above goal.  .  He denies any hypoglycemic events.  Will increase glipizide XL to 5 mg daily.  CCM referral to see if Catie can facilititate Jardiance rx.  Lab Results  Component Value Date   HGBA1C 8.0 (H) 04/22/2020         Relevant Orders   Hemoglobin A1c   Comprehensive metabolic panel   AMB Referral to [facility]   Polyarthritis of multiple sites    Reminded to avoid NSAIDs;  He has been taking Naprosyn based on his questions today.       Nephropathy, diabetic (HCC)    Slightly worse.  Continue losartan. Advised to check BP at home given goal of 130/80 or less   Lab Results  Component Value Date   CREATININE 1.46 04/22/2020   Lab Results  Component Value Date   MICROALBUR 24.3 (H) 04/22/2020   MICROALBUR 15.9 (H) 09/27/2019           Long term current use of anticoagulant therapy    Inr  is therapeutic on 3 mg daily .  He has been advised to continue current regimen and repeat PT/INR in one month  Lab Results  Component Value Date   INR 2.6 (H) 07/15/2020   INR 2.7 (H) 06/13/2020   INR 2.3 (H) 05/08/2020        Hyperlipidemia associated with type 2 diabetes mellitus (HCC)   CKD stage 3 due to type 2 diabetes mellitus (HCC)    Renal function has  stabilized with avoidance of NSAIDs.  he is on an ARB for control of hypertension,, and statin for control of hyperlipidemia.   Lab Results  Component Value Date   CREATININE 1.46 04/22/2020         Aortic atherosclerosis [facility])    Reviewed findings of prior CT scan from August 2020 .  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  Continue lifelong anticoagulation       Other Visit Diagnoses    Carotid atherosclerosis, bilateral       Relevant Orders   AMB Referral to [facility]ordin[facility]      I have discontinued Pamelia Hoit B. Tuggle "[name]"'s glipiZIDE and glipiZIDE. I am also having him maintain his allopurinol, hydrochlorothiazide, metFORMIN, warfarin, losartan, and atorvastatin.  No orders of the defined types were placed in this encounter.   Medications Discontinued During This  Encounter  Medication Reason  . glipiZIDE (GLUCOTROL XL) 5 MG 24 hr tablet   . glipiZIDE (GLUCOTROL XL) 2.5 MG 24 hr tablet     Follow-up: Return in about 3 months (around 10/12/2020) for follow up diabetes.   Sherlene Shamseresa L Kayven Aldaco, MD

## 2020-07-15 NOTE — Assessment & Plan Note (Signed)
Renal function has  stabilized with avoidance of NSAIDs.  he is on an ARB for control of hypertension,, and statin for control of hyperlipidemia.   Lab Results  Component Value Date   CREATININE 1.46 04/22/2020

## 2020-07-15 NOTE — Telephone Encounter (Signed)
Spoke with pt to let him know that he needs to increase his glipizide to 5 mg daily. Pt gave a verbal understanding.

## 2020-07-15 NOTE — Patient Instructions (Addendum)
1) We need to get your blood sugars down.  If you are NOT taking 5 mg of the glipizide, INCREASE TO 5 MG DALY  IF YOU ALREADY ARE TAKING 5 MG  INCREASE TO 10 MG  CATIE TRAVIS WILL BE CALLING YOU TO TALK ABOUT JARDIANCE .  This is a medication that has been shown to prevent heart attacks   You have placque in your carotid arteries.  This means you have heart disease  even if you don't have symptoms     2) GOAL BP 130/80     . CHECK ONCE DAILY FOR 7 DAYS AND SEND ME READINGS    3) to preserve your kidney function   avoid naprosyn,  Naproxen,  Ibuprofen,  Motrin, aleve,  And Advil  You can safely take   Up to 2000 mg of acetominophen (tylenol) every day safely  In divided doses (500 mg every 6 hours  Or 1000 mg every 12 hours.)  4) You have gained 16 lb over the last 6 months.  Please lose it !  Start walking   Repeat labs  On or around March 16

## 2020-07-15 NOTE — Assessment & Plan Note (Signed)
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  Continue lifelong anticoagulation

## 2020-07-15 NOTE — Addendum Note (Signed)
Addended by: Sherlene Shams on: 07/15/2020 01:02 PM   Modules accepted: Orders

## 2020-07-16 ENCOUNTER — Other Ambulatory Visit: Payer: Medicare Other

## 2020-07-17 NOTE — Chronic Care Management (AMB) (Signed)
  Chronic Care Management   Outreach Note  07/17/2020 Name: Stephen Kelly MRN: [id number] DOB: 1940-12-25  Stephen Kelly is a 80 y.o. year old male who is a primary care patient of Darrick Huntsman, Mar Daring, MD. I reached out to Susa Simmonds by phone today in response to a referral sent by Mr. Magdiel Bartles Vanbrocklin's PCP, Sherlene Shams, MD     A second unsuccessful telephone outreach was attempted today. The patient was referred to the case management team for assistance with care management and care coordination.   Follow Up Plan: A HIPAA compliant phone message was left for the patient providing contact information and requesting a return call.  The care management team will reach out to the patient again over the next 3 days.  If patient returns call to provider office, please advise to call Embedded Care Management Care Guide Penne Lash  at [phone]  Penne Lash, RMA Care Guide, Embedded Care Coordination [facility]  [place], [place] [address] Direct Dial: [phone] [email or web address] Website: [email or web address]

## 2020-07-22 ENCOUNTER — Telehealth: Payer: Self-pay

## 2020-07-22 NOTE — Chronic Care Management (AMB) (Signed)
  Chronic Care Management   Note  07/22/2020 Name: Stephen Kelly MRN: [id number] DOB: Nov 08, 1940  Stephen Kelly is a 80 y.o. year old male who is a primary care patient of Derrel Nip, Aris Everts, MD. I reached out to Luster Landsberg by phone today in response to a referral sent by Stephen Kelly's PCP,Tullo, Aris Everts, MD     Mr. Stoudt was given information about Chronic Care Management services today including:  1. CCM service includes personalized support from designated clinical staff supervised by his physician, including individualized plan of care and coordination with other care providers 2. 24/7 contact phone numbers for assistance for urgent and routine care needs. 3. Service will only be billed when office clinical staff spend 20 minutes or more in a month to coordinate care. 4. Only one practitioner may furnish and bill the service in a calendar month. 5. The patient may stop CCM services at any time (effective at the end of the month) by phone call to the office staff. 6. The patient will be responsible for cost sharing (co-pay) of up to 20% of the service fee (after annual deductible is met).  Patient agreed to services and verbal consent obtained.   Follow up plan: Telephone appointment with care management team member scheduled for:08/06/2020  Noreene Larsson, [place], [place], [place] [address] Direct Dial: [phone] [email or web address] Website: [email or web address]

## 2020-07-22 NOTE — Progress Notes (Signed)
ERROR

## 2020-08-06 ENCOUNTER — Ambulatory Visit (INDEPENDENT_AMBULATORY_CARE_PROVIDER_SITE_OTHER): Payer: Medicare Other | Admitting: Pharmacist

## 2020-08-06 DIAGNOSIS — E1129 Type 2 diabetes mellitus with other diabetic kidney complication: Secondary | ICD-10-CM

## 2020-08-06 DIAGNOSIS — M109 Gout, unspecified: Secondary | ICD-10-CM

## 2020-08-06 DIAGNOSIS — E1122 Type 2 diabetes mellitus with diabetic chronic kidney disease: Secondary | ICD-10-CM | POA: Diagnosis not present

## 2020-08-06 DIAGNOSIS — D6869 Other thrombophilia: Secondary | ICD-10-CM

## 2020-08-06 DIAGNOSIS — E785 Hyperlipidemia, unspecified: Secondary | ICD-10-CM | POA: Diagnosis not present

## 2020-08-06 DIAGNOSIS — N183 Chronic kidney disease, stage 3 unspecified: Secondary | ICD-10-CM | POA: Diagnosis not present

## 2020-08-06 DIAGNOSIS — R809 Proteinuria, unspecified: Secondary | ICD-10-CM

## 2020-08-06 DIAGNOSIS — I700 Atherosclerosis of aorta: Secondary | ICD-10-CM

## 2020-08-06 DIAGNOSIS — E1169 Type 2 diabetes mellitus with other specified complication: Secondary | ICD-10-CM

## 2020-08-06 MED ORDER — DAPAGLIFLOZIN PROPANEDIOL 5 MG PO TABS: 5.0000 mg | ORAL_TABLET | Freq: Every day | ORAL | 2 refills | Status: DC

## 2020-08-06 NOTE — Progress Notes (Addendum)
Chronic Care Management Pharmacy Note  08/06/2020 Name:  Stephen Kelly MRN:  [id number] DOB:  [birth year]  Subjective: Stephen Kelly is an 80 y.o. year old male who is a primary patient of Tullo, Aris Everts, MD.  The CCM team was consulted for assistance with disease management and care coordination needs.    Engaged with patient by telephone for initial visit in response to provider referral for pharmacy case management and/or care coordination services.   Consent to Services:  The patient was given the following information about Chronic Care Management services today, agreed to services, and gave verbal consent: 1. CCM service includes personalized support from designated clinical staff supervised by the primary care provider, including individualized plan of care and coordination with other care providers 2. 24/7 contact phone numbers for assistance for urgent and routine care needs. 3. Service will only be billed when office clinical staff spend 20 minutes or more in a month to coordinate care. 4. Only one practitioner may furnish and bill the service in a calendar month. 5.The patient may stop CCM services at any time (effective at the end of the month) by phone call to the office staff. 6. The patient will be responsible for cost sharing (co-pay) of up to 20% of the service fee (after annual deductible is met). Patient agreed to services and consent obtained.  Patient Care Team: Crecencio Mc, MD as PCP - General (Internal Medicine) De Hollingshead, RPH-CPP (Pharmacist)  Recent office visits: 2/21 - PCP visit, ordered chronic labs for after 3/16. F/u HTN, avoid NSAIDs. Consider SGLT2, though cost concern  Recent consult visits: None recently  Hospital visits: None in previous 6 months  Objective:  Lab Results  Component Value Date   CREATININE 1.46 04/22/2020   BUN 22 04/22/2020   GFR 45.70 (L) 04/22/2020   GFRNONAA 33 (L) 01/17/2019   GFRAA 39 (L) 01/17/2019   NA 137  04/22/2020   K 4.3 04/22/2020   CALCIUM 10.5 04/22/2020   CO2 29 04/22/2020    Lab Results  Component Value Date/Time   HGBA1C 8.0 (H) 04/22/2020 08:50 AM   HGBA1C 7.4 (H) 01/18/2020 08:01 AM   GFR 45.70 (L) 04/22/2020 08:50 AM   GFR 42.89 (L) 01/18/2020 08:01 AM   MICROALBUR 24.3 (H) 04/22/2020 08:50 AM   MICROALBUR 15.9 (H) 09/27/2019 10:17 AM    Last diabetic Eye exam:  Lab Results  Component Value Date/Time   HMDIABEYEEXA No Retinopathy 11/23/2017 03:48 PM    Last diabetic Foot exam:  Lab Results  Component Value Date/Time   HMDIABFOOTEX NORMAL 03/12/2014 12:00 AM     Lab Results  Component Value Date   CHOL 108 04/22/2020   HDL 28.60 (L) 04/22/2020   LDLCALC 17 03/17/2017   LDLDIRECT 41.0 04/22/2020   TRIG 393.0 (H) 04/22/2020   CHOLHDL 4 04/22/2020    Hepatic Function Latest Ref Rng & Units 04/22/2020 01/18/2020 09/27/2019  Total Protein 6.0 - 8.3 g/dL 6.8 6.7 6.5  Albumin 3.5 - 5.2 g/dL 4.3 4.0 4.1  AST 0 - 37 U/L '13 12 12  ' ALT 0 - 53 U/L '13 11 13  ' Alk Phosphatase 39 - 117 U/L 44 45 45  Total Bilirubin 0.2 - 1.2 mg/dL 0.6 0.6 0.5    Lab Results  Component Value Date/Time   TSH 2.25 09/27/2019 10:17 AM   TSH 2.96 03/29/2019 02:27 PM    CBC Latest Ref Rng & Units 09/27/2019 03/29/2019 01/17/2019  WBC 4.0 - 10.5  K/uL 9.0 10.7(H) 11.4(H)  Hemoglobin 13.0 - 17.0 g/dL 12.9(L) 12.8(L) 13.2  Hematocrit 39.0 - 52.0 % 37.9(L) 37.9(L) 38.3(L)  Platelets 150.0 - 400.0 K/uL 233.0 277.0 283    Lab Results  Component Value Date/Time   VD25OH 31.98 03/11/2016 10:19 AM    Clinical ASCVD: No     Depression screen [facility] 2/9 03/29/2020 09/27/2019 07/02/2017  Decreased Interest 0 0 0  Down, Depressed, Hopeless 0 0 0  PHQ - 2 Score 0 0 0  Altered sleeping - - 0  Tired, decreased energy - - 1  Change in appetite - - 0  Feeling bad or failure about yourself  - - 0  Trouble concentrating - - 0  Moving slowly or fidgety/restless - - 0  Suicidal thoughts - - 0  PHQ-9 Score  - - 1  Difficult doing work/chores - - Not difficult at all      Social History   Tobacco Use  Smoking Status Never Smoker  Smokeless Tobacco Never Used   BP Readings from Last 3 Encounters:  07/15/20 140/66  05/08/20 122/78  04/03/20 136/84   Pulse Readings from Last 3 Encounters:  07/15/20 (!) 101  05/08/20 94  04/03/20 83   Wt Readings from Last 3 Encounters:  07/15/20 204 lb 9.6 oz (92.8 kg)  05/08/20 204 lb (92.5 kg)  04/03/20 207 lb 9.6 oz (94.2 kg)    Assessment/Interventions: Review of patient past medical history, allergies, medications, health status, including review of consultants reports, laboratory and other test data, was performed as part of comprehensive evaluation and provision of chronic care management services.   SDOH:  (Social Determinants of Health) assessments and interventions performed: Yes SDOH Interventions    Flowsheet Row Most Recent Value  SDOH Interventions   Financial Strain Interventions Other (Comment)  [discussed manufacturer assistance]       CCM Care Plan  Allergies  Allergen Reactions   Azithromycin Rash    Medications Reviewed Today     Reviewed by De Hollingshead, RPH-CPP (Pharmacist) on 08/06/20 at 1341  Med List Status: <None>   Medication Order Taking? Sig Documenting Provider Last Dose Status Informant  allopurinol (ZYLOPRIM) 300 MG tablet [id number] Yes TAKE 1 TABLET(300 MG) BY MOUTH DAILY Crecencio Mc, MD Taking Active   atorvastatin (LIPITOR) 20 MG tablet [id number] Yes TAKE 1 TABLET(20 MG) BY MOUTH DAILY Crecencio Mc, MD Taking Active   Cyanocobalamin ([facility] VITAMIN B-12) 5000 MCG SUBL [id number] Yes Place 5,000 mcg under the tongue daily. [provider] Taking Active   glipiZIDE (GLUCOTROL XL) 5 MG 24 hr tablet [id number] Yes Take 1 tablet (5 mg total) by mouth daily with breakfast. Crecencio Mc, MD Taking Active            Med Note De Hollingshead   Tue Aug 06, 2020  1:38 PM) Taking 2.5 mg  QAM  hydrochlorothiazide (HYDRODIURIL) 25 MG tablet [id number] Yes TAKE 1 TABLET(25 MG) BY MOUTH DAILY Crecencio Mc, MD Taking Active   losartan (COZAAR) 100 MG tablet [id number] Yes TAKE 1 TABLET(100 MG) BY MOUTH DAILY Crecencio Mc, MD Taking Active   meclizine (ANTIVERT) 25 MG tablet [id number] Yes Take 25 mg by mouth in the morning. For dizziness [provider] Taking Active   metFORMIN (GLUCOPHAGE) 850 MG tablet [id number] Yes TAKE 1 TABLET(850 MG) BY MOUTH TWICE DAILY WITH A MEAL Crecencio Mc, MD Taking Active   warfarin (COUMADIN) 3 MG tablet [id number] Yes TAKE 1 TABLET(3  MG) BY MOUTH DAILY Crecencio Mc, MD Taking Active             Patient Active Problem List   Diagnosis Date Noted   CKD stage 3 due to type 2 diabetes mellitus ([place]) 07/15/2020   Aortic atherosclerosis ([place]) 07/15/2020   B12 deficiency 04/24/2020   Acquired thrombophilia ([place]) 04/05/2020   Vertigo of central origin 03/26/2018   Cataract cortical, senile, bilateral 03/24/2018   Fatigue 03/24/2018   Type 2 diabetes mellitus with microalbuminuria ([place]) 07/02/2017   Left knee pain 03/22/2017   Erectile dysfunction 04/21/2016   Anticoagulation monitoring, INR range 2-3 09/10/2014   Encounter for preventive measure 03/13/2014   Hyperlipidemia    Polyarthritis of multiple sites 02/05/2014   Long term current use of anticoagulant therapy 12/08/2013   Hyperlipidemia associated with type 2 diabetes mellitus ([place]) 03/03/2012   Nephropathy, diabetic ([place]) 08/31/2011   Patent foramen ovale    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[place])    Hypertension 04/23/2011   Screening for colon cancer 04/22/2011   OSA (obstructive sleep apnea) 04/22/2011   Clotting disorder ([place])    History of renal calculi    Gout, arthritis    Pulmonary nodule, right     Immunization History  Administered Date(s) Administered   Influenza Split 03/02/2012   Influenza, High Dose Seasonal PF 03/12/2014, 03/11/2015,  03/11/2016, 03/17/2017, 03/24/2018   Influenza,inj,Quad PF,6+ Mos 02/20/2013   Influenza,inj,quad, With Preservative 03/17/2017   Influenza-Unspecified 04/09/2019, 03/08/2020   PFIZER(Purple Top)SARS-COV-2 Vaccination 06/01/2019, 06/22/2019, 02/23/2020   Pneumococcal Conjugate-13 09/06/2013   Pneumococcal Polysaccharide-23 04/22/2011   Pneumococcal-Unspecified 02/23/2016   Tdap 04/29/2011   Zoster 03/23/2014   Zoster Recombinat (Shingrix) 07/08/2017, 09/28/2017    Conditions to be addressed/monitored:  Hypertension, Hyperlipidemia, Diabetes and Gout  Care Plan : Medication Management  Updates made by De Hollingshead, RPH-CPP since 08/06/2020 12:00 AM     Problem: Diabetes, Hypertension, Hyperlipidemia      Long-Range Goal: Disease Progression Prevention   Start Date: 08/06/2020  This Visit's Progress: On track  Priority: High  Note:   Unable to independently afford treatment regimen Unable to achieve control of diabetes   Pharmacist Clinical Goal(s):  Over the next 90 days, patient will achieve control of diabetes as evidenced by A1c through collaboration with PharmD and provider.   Interventions: 1:1 collaboration with Crecencio Mc, MD regarding development and update of comprehensive plan of care as evidenced by provider attestation and co-signature Inter-disciplinary care team collaboration (see longitudinal plan of care) Comprehensive medication review performed; medication list updated in electronic medical record  Health Maintenance: Notes that he uses a once daily pill box that he fills every 2 weeks, and just remembers to leave one metformin in the box for the evening. Offered a BID pill box to aid in adherence. Patient amenable. Will provide pill box and list of how to fill.   Diabetes: Uncontrolled; current treatment: metformin 850 mg BID, glipizide XL 2.5 mg QAM - was to increase to 5 mg daily at last PCP appointment but has not yet;  Current glucose  readings: fasting glucose: were in ~ 200s, but now in ~ 150s; post prandial glucose: not checking Denies hypoglycemic symptoms Current meal patterns: will discuss at next visit Current exercise: will discuss at next visit Educated on goal fasting glucose Recommended to initiate SGLT2 for glycemic, cardiovascular, and renal benefit Assessed patient finances. He might be over income for [place] assistance through [facility], but would qualify for [place] assistance from  [employer]. Discussed application process, patient amenable. Will provide with Farxiga free 30 day trial offer to start. Will collaborate w/ CPhT and PCP for application.  Start Farxiga 5 mg daily. Discontinue glipizide.  Hypertension: Uncontrolled at last office visit; current treatment: HCTZ 25 mg QAM, losartan 100 mg QAM Current home readings: not checking, but reports he has a meter at home Discussed impact of SGLT2 on blood pressure, encouraged occasional home monitoring, patient amenable.  Consider impact of HCTZ on uric acid level. Appropriate to continue at this time given lack of recent gout flares.   Hyperlipidemia: Controlled per last lipid panel; current treatment: atorvastatin 20 mg daily   Recommend to continue current regimen at this time  Gout: Controlled per patient report; current regimen: allopurinol 300 mg daily Reports last gout flare was "years ago" Last uric acid on file was from 2017, was not at goal <6% Will add updated uric acid lab on with patient's upcoming lab appointments.  Continue current regimen at this time  Clotting Disorder: Appropriately managed; current regimen: warfarin 3 mg daily INR goal 2-3, managed by PCP Continue current regimen along with PCP collaboration at this time  Over the Counter Medications: Reports he is taking meclizine 25 mg every morning to prevent vertigo. Also taking Vitamin B12 5000 mcg daily Counseled that meclizine can cause sedation, increase falls risk.  Discussed only using as needed for episodes of dizziness. Patient verbalized understanding  Patient Goals/Self-Care Activities Over the next 90 days, patient will:  - take medications as prescribed check glucose daily, document, and provide at future appointments check blood pressure periodically, document, and provide at future appointments collaborate with provider on medication access solutions  Follow Up Plan: Telephone follow up appointment with care management team member scheduled for: ~ 5 weeks       Medication Assistance: Application for Farxiga   medication assistance program. in process.  Anticipated assistance start date TBD.  See plan of care for additional detail.  Patient's preferred pharmacy is:  [facility] ST[facility] #[id number] [place], [place] - [address] [facility] [place] [address] Phone: [phone] Fax: [phone]   Care Plan and Follow Up Patient Decision:  Patient agrees to Care Plan and Follow-up.  Plan: Telephone follow up appointment with care management team member scheduled for:  ~ 5 weeks  Catie Darnelle Maffucci, PharmD, [place], CPP Clinical Pharmacist [place] at [facility] [phone]   Encounter details: CCM Time Spent       Value Time User   Time spent with patient (minutes)  62 08/06/2020  5:16 PM De Hollingshead, RPH-CPP   Time spent performing Chart review  10 08/06/2020  5:16 PM De Hollingshead, RPH-CPP   Total time (minutes)  72 08/06/2020  5:16 PM De Hollingshead, RPH-CPP      Moderate to High Complex Decision Making       Value Time User   Moderate to High complex decision making  Yes 08/06/2020  5:16 PM De Hollingshead, RPH-CPP      CCM Services: complex  Prior to outreach and patient consent for Chronic Care Management, I referred this patient for services after reviewing the nominated patient list or from a personal encounter with the patient.  I have personally reviewed this  encounter including the documentation in this note and have collaborated with the care management provider regarding care management and care coordination activities to include development and update of the comprehensive care plan. I  am certifying that I agree with the content of this note and encounter as supervising physician.

## 2020-08-06 NOTE — Patient Instructions (Addendum)
It was great talking to you!  Start Farxiga 5 mg daily. Take this free trial offer to [facility] to get a 30 day supply FOR FREE while we work on patient assistance from the manufacturer.   STOP GLIPIZIDE. STOP TAKING MECLIZINE EVERY DAY.   Here is how to fill this pill box:   Morning: - Allopurinol 300 mg - gout prevention - Atorvastatin 20 mg - cholesterol - Losartan 100 mg - blood pressure - Hydrochlorothiazide 25 mg - blood pressure - Metformin 850 mg - diabetes - Farxiga 5 mg - diabetes - NEW MEDICATION - Warfarin per Dr. Derrel Nip  Evening: - Metformin 850 mg - diabetes  Call me with any questions!  Stephen Kelly, PharmD [phone]  Visit Information  PATIENT GOALS:  Goals Addressed              This Visit's Progress     Patient Stated   .  Medication Monitoring (pt-stated)        Patient Goals/Self-Care Activities . Over the next 90 days, patient will:  - take medications as prescribed check glucose daily, document, and provide at future appointments check blood pressure periodically, document, and provide at future appointments collaborate with provider on medication access solutions        Consent to CCM Services: [name] was given information about Chronic Care Management services today including:  1. CCM service includes personalized support from designated clinical staff supervised by his physician, including individualized plan of care and coordination with other care providers 2. 24/7 contact phone numbers for assistance for urgent and routine care needs. 3. Service will only be billed when office clinical staff spend 20 minutes or more in a month to coordinate care. 4. Only one practitioner may furnish and bill the service in a calendar month. 5. The patient may stop CCM services at any time (effective at the end of the month) by phone call to the office staff. 6. The patient will be responsible for cost sharing (co-pay) of up to 20% of the service  fee (after annual deductible is met).  Patient agreed to services and verbal consent obtained.   Print copy of patient instructions, educational materials, and care plan provided in person.   Plan: Telephone follow up appointment with care management team member scheduled for:  ~ 5 weeks  Stephen Kelly, PharmD, [place], CPP Clinical Pharmacist [place] at [facility]: Patient Care Plan: Medication Management    Problem Identified: Diabetes, Hypertension, Hyperlipidemia     Long-Range Goal: Disease Progression Prevention   Start Date: 08/06/2020  This Visit's Progress: On track  Priority: High  Note:   . Unable to independently afford treatment regimen . Unable to achieve control of diabetes   Pharmacist Clinical Goal(s):  Stephen Kelly Over the next 90 days, patient will achieve control of diabetes as evidenced by A1c through collaboration with PharmD and provider.   Interventions: . 1:1 collaboration with Crecencio Mc, MD regarding development and update of comprehensive plan of care as evidenced by provider attestation and co-signature . Inter-disciplinary care team collaboration (see longitudinal plan of care) . Comprehensive medication review performed; medication list updated in electronic medical record  Health Maintenance: . Notes that he uses a once daily pill box that he fills every 2 weeks, and just remembers to leave one metformin in the box for the evening. Offered a BID pill box to aid in adherence. Patient amenable. Will provide pill box and list of how  to fill.   Diabetes: . Uncontrolled; current treatment: metformin 850 mg BID, glipizide XL 2.5 mg QAM - was to increase to 5 mg daily at last PCP appointment but has not yet;  . Current glucose readings: fasting glucose: were in ~ 200s, but now in ~ 150s; post prandial glucose: not checking . Denies hypoglycemic symptoms . Current meal patterns: will discuss at next  visit . Current exercise: will discuss at next visit . Educated on goal fasting glucose . Recommended to initiate SGLT2 for glycemic, cardiovascular, and renal benefit . Assessed patient finances. He might be over income for [place] assistance through [facility], but would qualify for [place] assistance from [employer]. Discussed application process, patient amenable. Will provide with samples of Farxiga to start. Will collaborate w/ CPhT and PCP for application.   Hypertension: . Uncontrolled at last office visit; current treatment: HCTZ 25 mg QAM, losartan 100 mg QAM . Current home readings: not checking, but reports he has a meter at home . Discussed impact of SGLT2 on blood pressure, encouraged occasional home monitoring, patient amenable.   Hyperlipidemia: . Controlled; current treatment: atorvastatin 20 mg daily   . Recommend to continue current regimen at this time  Gout: . Controlled per patient report; current regimen: allopurinol 300 mg daily . Reports last gout flare was "years ago" . Last uric acid on file was from 2017, was not at goal <6% . Will add updated uric acid lab on with patient's upcoming lab appointments.  . Continue current regimen at this time  Clotting Disorder: . Appropriately managed; current regimen: warfarin 3 mg daily INR goal 2-3, managed by PCP . Continue current regimen along with PCP collaboration at this time  Over the Counter Medications: . Reports he is taking meclizine 25 mg every morning to prevent vertigo. Also taking Vitamin B12 5000 mcg daily . Counseled that meclizine can cause sedation, increase falls risk. Discussed only using as needed for episodes of dizziness. Patient verbalized understanding  Patient Goals/Self-Care Activities . Over the next 90 days, patient will:  - take medications as prescribed check glucose daily, document, and provide at future appointments check blood pressure periodically, document, and provide at future  appointments collaborate with provider on medication access solutions  Follow Up Plan: Telephone follow up appointment with care management team member scheduled for: ~ 5 weeks

## 2020-08-07 ENCOUNTER — Ambulatory Visit: Payer: Medicare Other | Admitting: Internal Medicine

## 2020-08-07 DIAGNOSIS — D2262 Melanocytic nevi of left upper limb, including shoulder: Secondary | ICD-10-CM | POA: Diagnosis not present

## 2020-08-07 DIAGNOSIS — D225 Melanocytic nevi of trunk: Secondary | ICD-10-CM | POA: Diagnosis not present

## 2020-08-07 DIAGNOSIS — Z85828 Personal history of other malignant neoplasm of skin: Secondary | ICD-10-CM | POA: Diagnosis not present

## 2020-08-07 DIAGNOSIS — D2261 Melanocytic nevi of right upper limb, including shoulder: Secondary | ICD-10-CM | POA: Diagnosis not present

## 2020-08-13 ENCOUNTER — Telehealth: Payer: Self-pay | Admitting: Pharmacy Technician

## 2020-08-13 DIAGNOSIS — Z596 Low income: Secondary | ICD-10-CM

## 2020-08-13 NOTE — Progress Notes (Addendum)
[facility] Customer service manager [facility])                                            [facility] Quality Pharmacy Team    08/13/2020  Stephen Kelly September 13, 1940 [id number]    Received referral from Caroline More, PharmD for patient assistance with AZ&ME for [place].  Mailed patient's portion of AZ&ME application for [place] today.  Embedded PharmD to obtain provider's portion of the application.  Once application is received, will submit to AZ&ME for determination.  Jill P. Simcox, CPhT [employer] [employer]  [phone]

## 2020-08-14 ENCOUNTER — Other Ambulatory Visit (INDEPENDENT_AMBULATORY_CARE_PROVIDER_SITE_OTHER): Payer: Medicare Other

## 2020-08-14 ENCOUNTER — Other Ambulatory Visit: Payer: Self-pay

## 2020-08-14 DIAGNOSIS — M109 Gout, unspecified: Secondary | ICD-10-CM | POA: Diagnosis not present

## 2020-08-14 DIAGNOSIS — E1129 Type 2 diabetes mellitus with other diabetic kidney complication: Secondary | ICD-10-CM

## 2020-08-14 DIAGNOSIS — R809 Proteinuria, unspecified: Secondary | ICD-10-CM | POA: Diagnosis not present

## 2020-08-14 LAB — COMPREHENSIVE METABOLIC PANEL
ALT: 12 U/L (ref 0–53)
AST: 13 U/L (ref 0–37)
Albumin: 4.3 g/dL (ref 3.5–5.2)
Alkaline Phosphatase: 51 U/L (ref 39–117)
BUN: 37 mg/dL — ABNORMAL HIGH (ref 6–23)
CO2: 27 mEq/L (ref 19–32)
Calcium: 9.4 mg/dL (ref 8.4–10.5)
Chloride: 99 mEq/L (ref 96–112)
Creatinine, Ser: 1.83 mg/dL — ABNORMAL HIGH (ref 0.40–1.50)
GFR: 34.78 mL/min — ABNORMAL LOW (ref >60.00)
Glucose, Bld: 169 mg/dL — ABNORMAL HIGH (ref 70–99)
Potassium: 3.8 mEq/L (ref 3.5–5.1)
Sodium: 136 mEq/L (ref 135–145)
Total Bilirubin: 0.3 mg/dL (ref 0.2–1.2)
Total Protein: 6.9 g/dL (ref 6.0–8.3)

## 2020-08-14 LAB — HEMOGLOBIN A1C: Hgb A1c MFr Bld: 7.5 % — ABNORMAL HIGH (ref 4.6–6.5)

## 2020-08-14 LAB — URIC ACID: Uric Acid, Serum: 5.1 mg/dL (ref 4.0–7.8)

## 2020-08-14 IMAGING — CR LUMBAR SPINE - 2-3 VIEW
1 series · 3 of 3 positions shown · non-contrast
Comparison: None.

CLINICAL DATA: 78-year-old male with a history of lumbar back pain

EXAM:
LUMBAR SPINE - 2-3 VIEW

[Series 1: dg lumbar spine 2-3 views · 0.14mm/px · 3 of 3 slices shown]
[im 1/3]
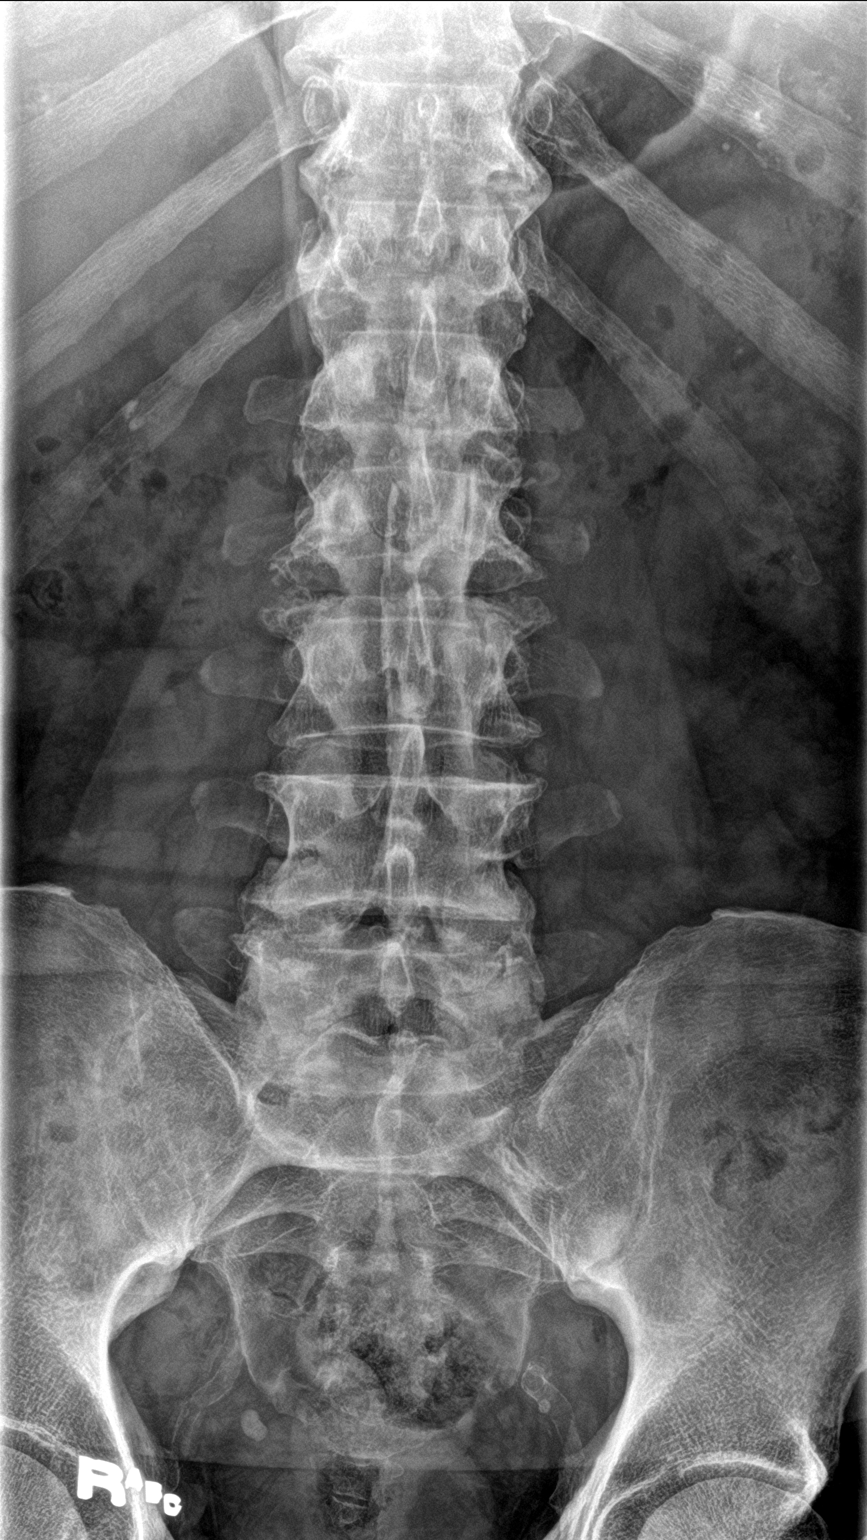
[im 2/3]
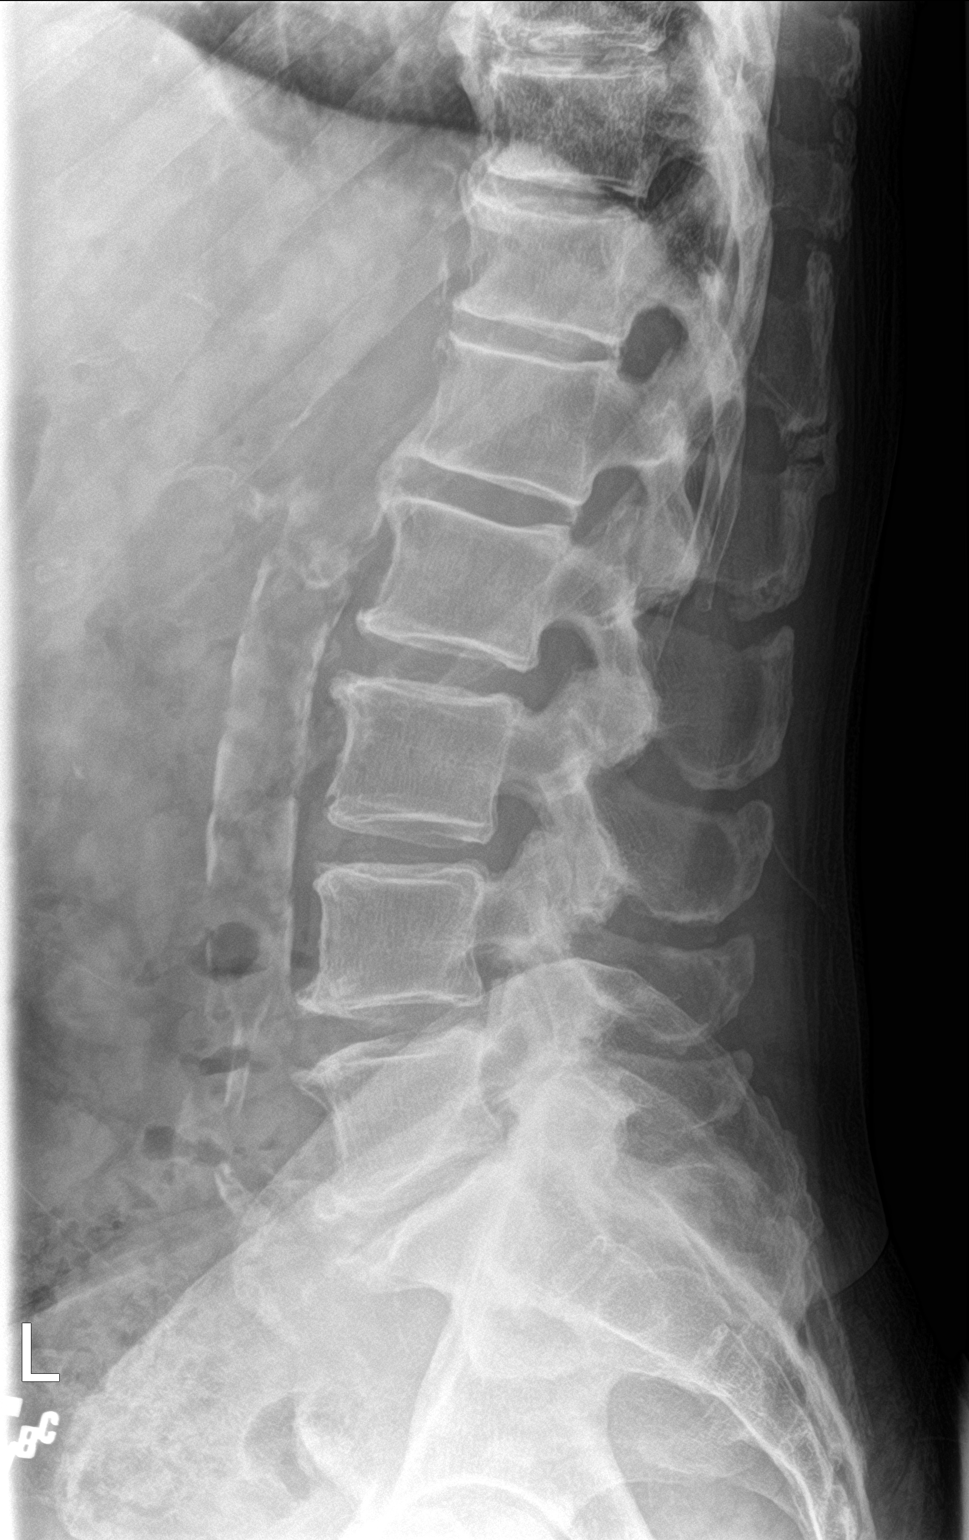
[im 3/3]
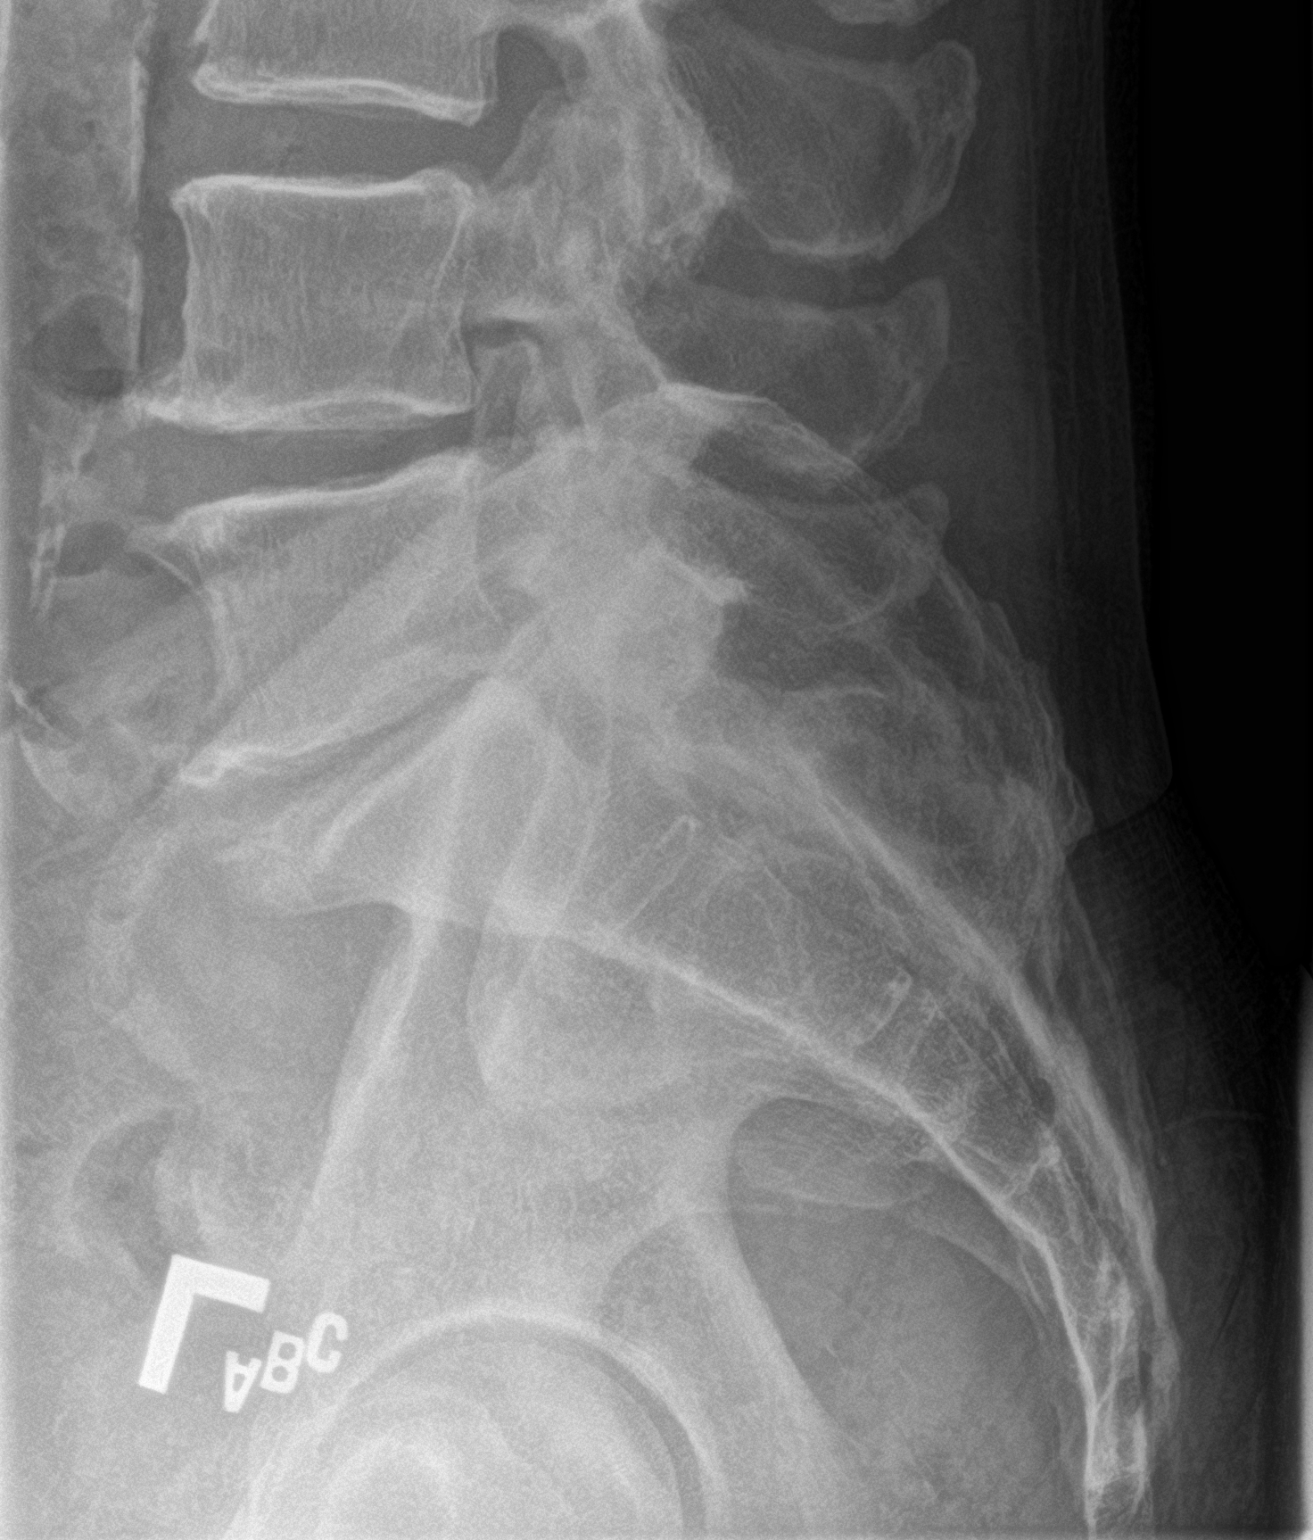

[3 of 3 positions shown; findings below may reference images not displayed]

FINDINGS: Lumbar Spine:

Lumbar vertebral elements maintain normal alignment without evidence
of anterolisthesis, retrolisthesis, subluxation.

No acute fracture line identified.

Vertebral body heights maintained.

Degenerative disc changes throughout the lumbar spine with the
greatest degree of narrowing at the L5-S1 level. L5-S1 demonstrates
sclerotic endplate changes and anterior osteophyte production.

Lesser degree of anterior osteophyte production endplate sclerosis
at L4-L5 and L2-L3.

Facet hypertrophy of L4-L5 and L5-S1.

Calcifications of the abdominal aorta.
IMPRESSION: Negative for acute fracture or malalignment of the lumbar spine.

Degenerative disc disease and facet disease most advanced at the
L4-S1 levels.

Aortic atherosclerosis

## 2020-08-14 IMAGING — CR DG HIP (WITH OR WITHOUT PELVIS) 2-3V RIGHT
1 series · 3 of 3 positions shown · non-contrast
Comparison: Abdomen CT 06/04/2010.

CLINICAL DATA: 78-year-old male status post fall backwards this
morning.

EXAM:
DG HIP (WITH OR WITHOUT PELVIS) 2-3V RIGHT

[Series 1: dg hip unilat w or w/o pelvis 2-3 views  · non-contrast · 0.14mm/px · 3 of 3 slices shown]
[im 1/3]
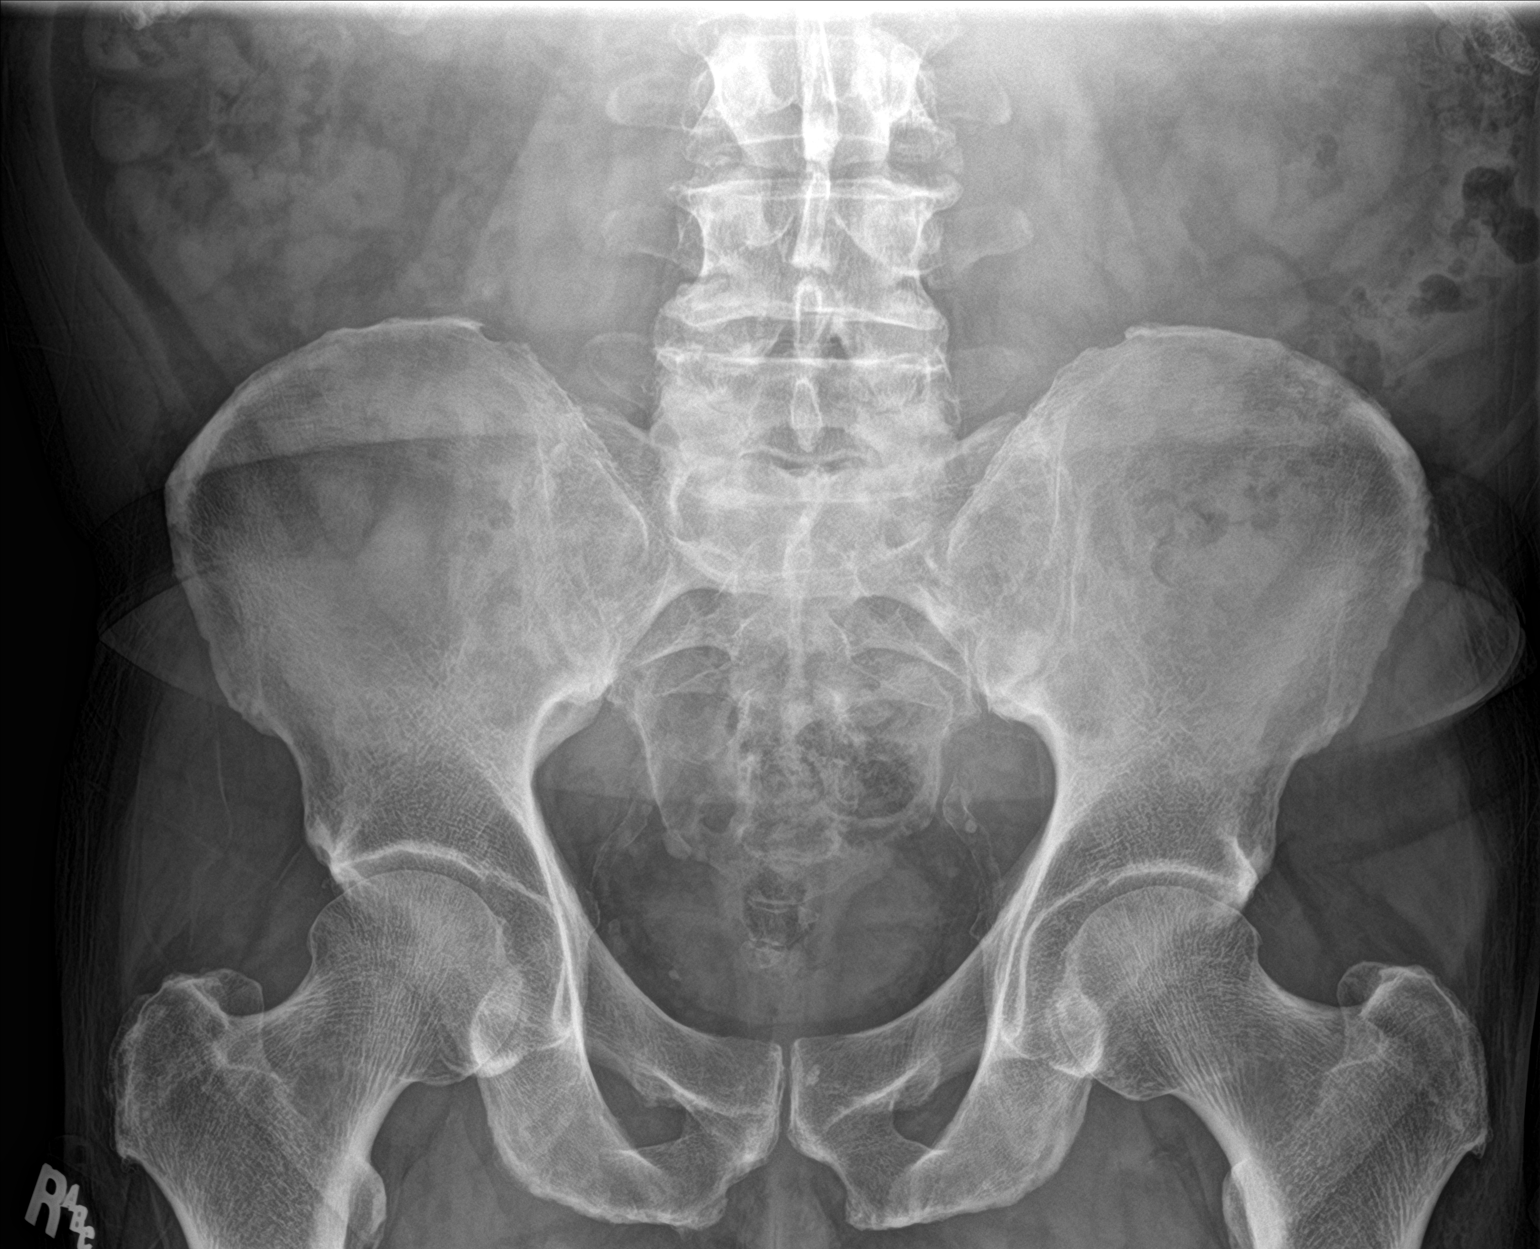
[im 2/3]
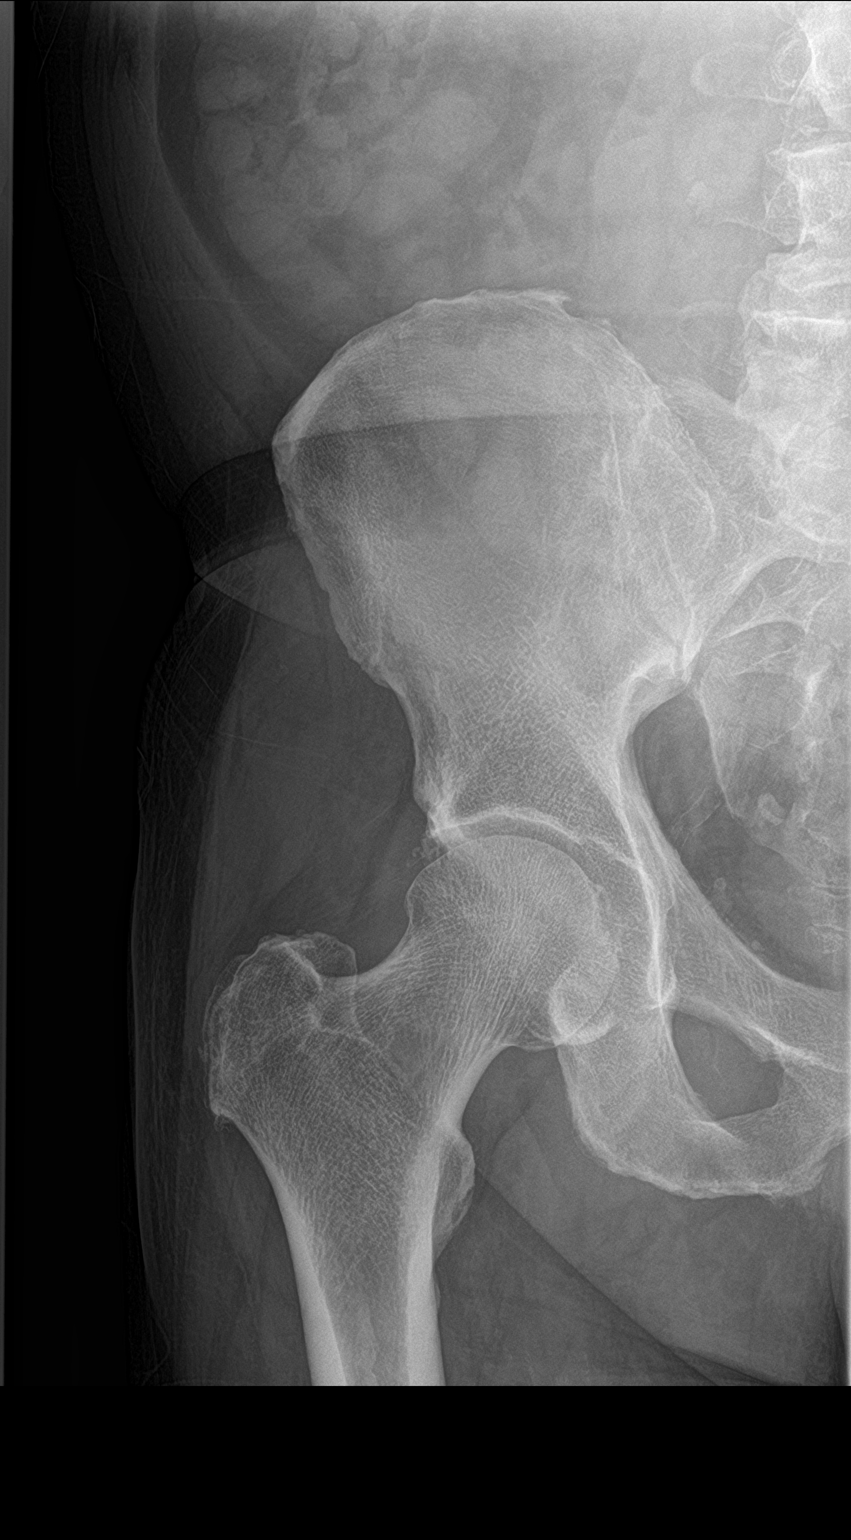
[im 3/3]
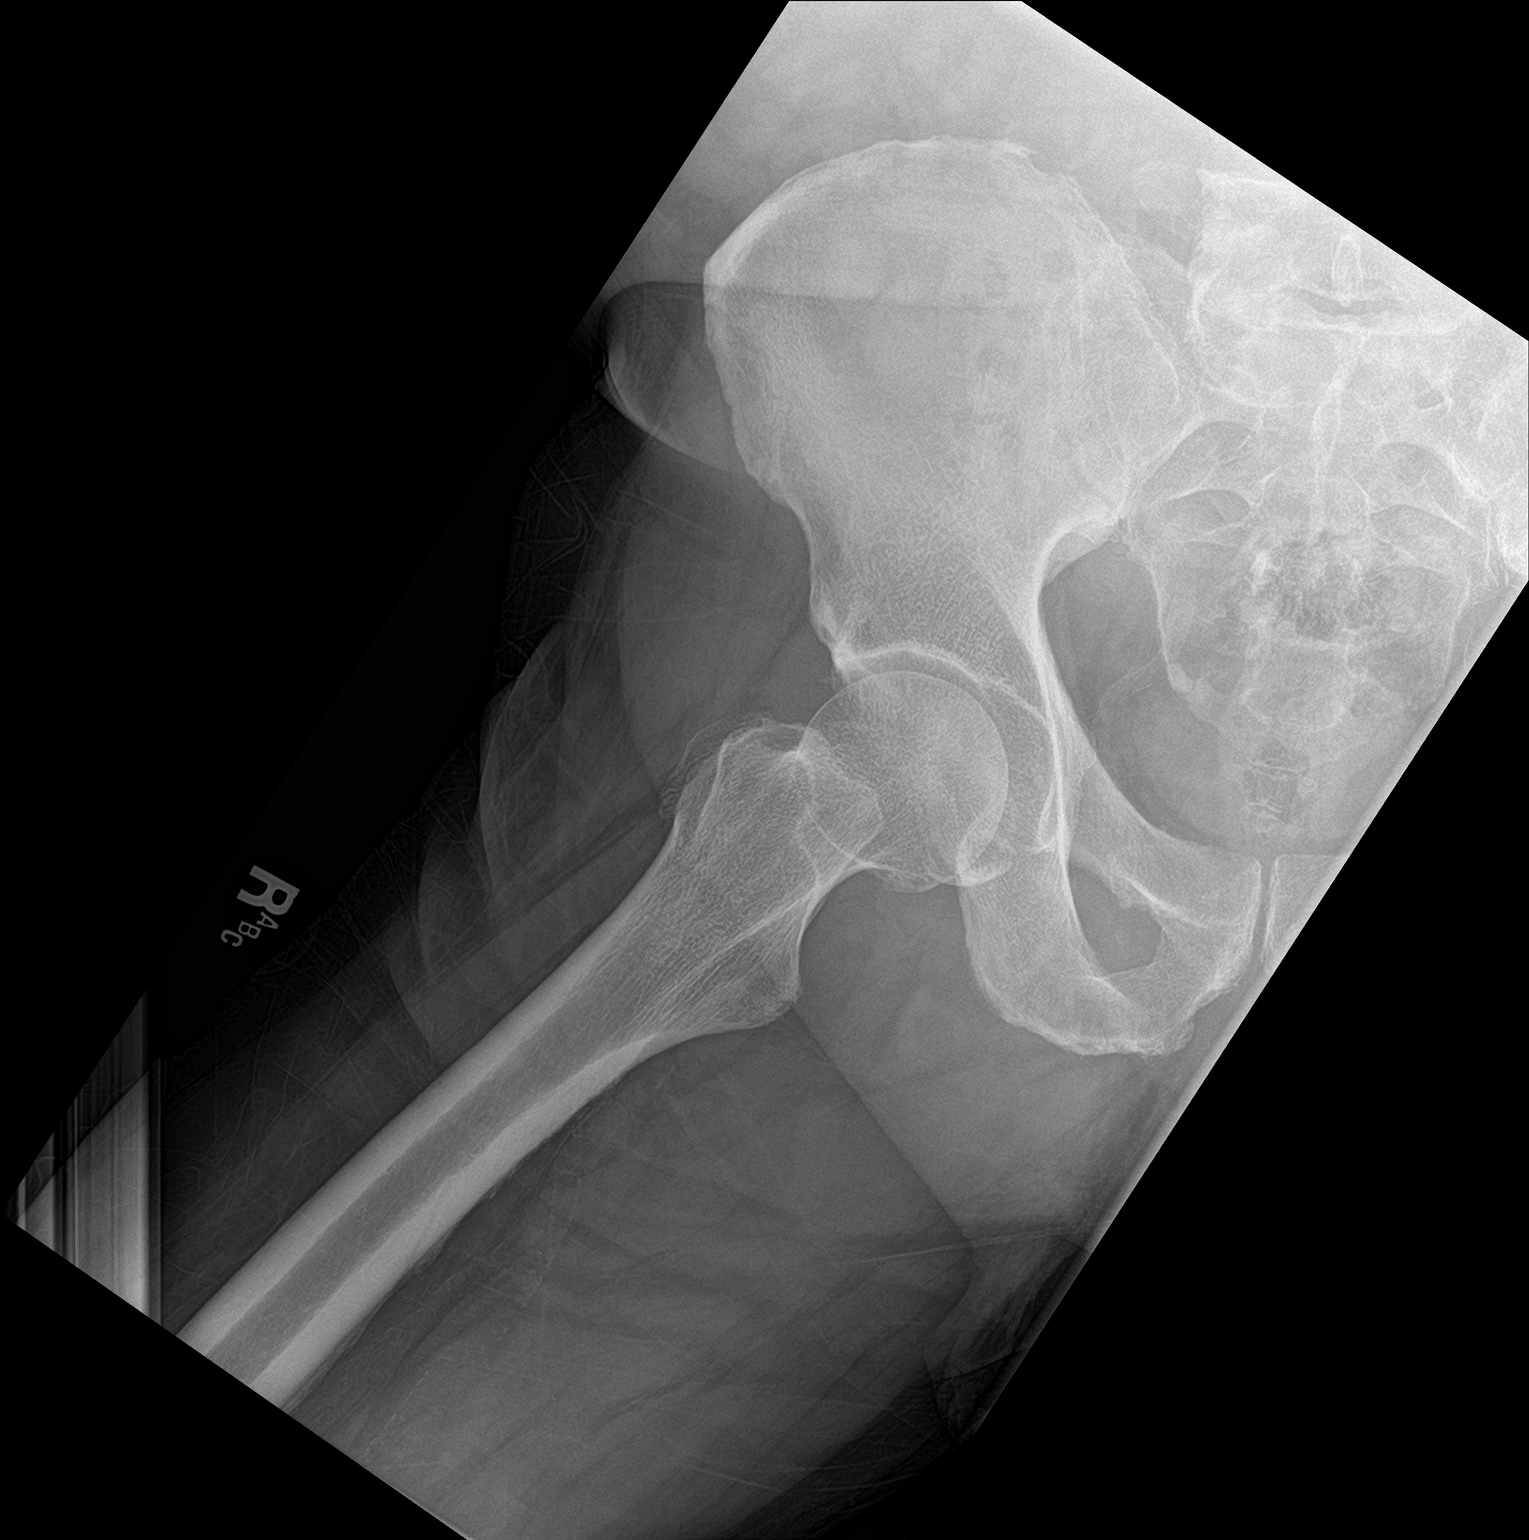

[3 of 3 positions shown; findings below may reference images not displayed]

FINDINGS: Femoral heads are normally located. Bone mineralization is within
normal limits for age. Grossly intact proximal left femur. The
pelvis appears intact. Normal SI joints. The proximal right femur
appears intact. Negative lower abdominal and pelvic visceral
contours.
IMPRESSION: No acute fracture or dislocation identified about the right hip or
pelvis.

## 2020-08-15 ENCOUNTER — Other Ambulatory Visit: Payer: Self-pay | Admitting: Internal Medicine

## 2020-08-15 ENCOUNTER — Encounter: Payer: Self-pay | Admitting: Internal Medicine

## 2020-08-15 NOTE — Progress Notes (Signed)
Diabetes control is improving,  but kidney function is worse so glipizide is no longer an option because it's activity can become unpredictable when kidney doesn't filter well.  I agree with Catie's recommendation to start [place] .  Has he been able to obtain it  yet, and does he have an appt with his kidney doctor soon?

## 2020-08-21 ENCOUNTER — Telehealth: Payer: Self-pay | Admitting: Internal Medicine

## 2020-08-21 DIAGNOSIS — N183 Chronic kidney disease, stage 3 unspecified: Secondary | ICD-10-CM

## 2020-08-21 DIAGNOSIS — E1122 Type 2 diabetes mellitus with diabetic chronic kidney disease: Secondary | ICD-10-CM

## 2020-08-21 NOTE — Telephone Encounter (Signed)
Patient would like a call back. Last week someone from the office called him about his lab results. He is not understanding the results about his kidney's and going to see a specialist. He would like to speak to [place].

## 2020-08-23 NOTE — Telephone Encounter (Signed)
Spoke with pt and further explained the pt's lab results. Pt stated that he has not see a kidney doctor since he had kidney stones back in the early 2000s. He stated that he went to [facility] then and is okay with going back to them.

## 2020-08-25 NOTE — Telephone Encounter (Signed)
NOt the right kind of doctor: they are urologists,  Not nephrologists (urologists are surgeons ,  Nephrologists are medicine doctors that managed kidney disease,  Not stones.  Referral is in progress to [facility] nephrology

## 2020-08-27 ENCOUNTER — Telehealth: Payer: Self-pay | Admitting: Internal Medicine

## 2020-08-27 DIAGNOSIS — E1129 Type 2 diabetes mellitus with other diabetic kidney complication: Secondary | ICD-10-CM | POA: Insufficient documentation

## 2020-08-27 DIAGNOSIS — E1169 Type 2 diabetes mellitus with other specified complication: Secondary | ICD-10-CM | POA: Insufficient documentation

## 2020-08-27 DIAGNOSIS — R809 Proteinuria, unspecified: Secondary | ICD-10-CM | POA: Insufficient documentation

## 2020-08-27 DIAGNOSIS — E782 Mixed hyperlipidemia: Secondary | ICD-10-CM | POA: Insufficient documentation

## 2020-08-27 NOTE — Telephone Encounter (Signed)
Patient called in stated that he has the paper work ready that you gave him to fill out wanted a call back

## 2020-08-27 NOTE — Telephone Encounter (Signed)
Spoke with pt and he stated that [facility] called him yesterday and scheduled an appt.

## 2020-08-28 ENCOUNTER — Ambulatory Visit (INDEPENDENT_AMBULATORY_CARE_PROVIDER_SITE_OTHER): Payer: Medicare Other | Admitting: Pharmacist

## 2020-08-28 DIAGNOSIS — E1129 Type 2 diabetes mellitus with other diabetic kidney complication: Secondary | ICD-10-CM

## 2020-08-28 DIAGNOSIS — E785 Hyperlipidemia, unspecified: Secondary | ICD-10-CM

## 2020-08-28 DIAGNOSIS — R809 Proteinuria, unspecified: Secondary | ICD-10-CM

## 2020-08-28 DIAGNOSIS — E1122 Type 2 diabetes mellitus with diabetic chronic kidney disease: Secondary | ICD-10-CM

## 2020-08-28 DIAGNOSIS — N183 Chronic kidney disease, stage 3 unspecified: Secondary | ICD-10-CM

## 2020-08-28 DIAGNOSIS — E1169 Type 2 diabetes mellitus with other specified complication: Secondary | ICD-10-CM

## 2020-08-28 DIAGNOSIS — I700 Atherosclerosis of aorta: Secondary | ICD-10-CM

## 2020-08-28 NOTE — Telephone Encounter (Signed)
Returned call. Patient coming by this afternoon.

## 2020-08-28 NOTE — Patient Instructions (Addendum)
  Visit Information  PATIENT GOALS: Goals Addressed              This Visit's Progress     Patient Stated   .  Medication Monitoring (pt-stated)        Patient Goals/Self-Care Activities . Over the next 90 days, patient will:  - take medications as prescribed check glucose daily, document, and provide at future appointments check blood pressure periodically, document, and provide at future appointments collaborate with provider on medication access solutions        Patient verbalizes understanding of instructions provided today and agrees to view in MyChart.    Plan: Telephone follow up appointment with care management team member scheduled for:  ~ 4 weeks as previously scheduled  Catie Feliz Beam, PharmD, [place], CPP Clinical Pharmacist [employer] at [employer] [phone]

## 2020-08-28 NOTE — Chronic Care Management (AMB) (Signed)
Chronic Care Management Pharmacy Note  08/28/2020 Name:  Stephen Kelly MRN:  [id number] DOB:  03/25/1941  Subjective: Stephen Kelly is an 80 y.o. year old male who is a primary patient of Tullo, Aris Everts, MD.  The CCM team was consulted for assistance with disease management and care coordination needs.    Engaged with patient face to face for medication access follow up in response to provider referral for pharmacy case management and/or care coordination services.   Consent to Services:  The patient was given information about Chronic Care Management services, agreed to services, and gave verbal consent prior to initiation of services.  Please see initial visit note for detailed documentation.   Patient Care Team: Crecencio Mc, MD as PCP - General (Internal Medicine) De Hollingshead, RPH-CPP (Pharmacist)  Recent office visits: None since our last call  Recent consult visits: None since our last call  Hospital visits: None in previous 6 months  Objective:  Lab Results  Component Value Date   CREATININE 1.83 (H) 08/14/2020   CREATININE 1.46 04/22/2020   CREATININE 1.57 (H) 01/18/2020    Lab Results  Component Value Date   HGBA1C 7.5 (H) 08/14/2020   Last diabetic Eye exam:  Lab Results  Component Value Date/Time   HMDIABEYEEXA No Retinopathy 11/23/2017 03:48 PM    Last diabetic Foot exam:  Lab Results  Component Value Date/Time   HMDIABFOOTEX NORMAL 03/12/2014 12:00 AM        Component Value Date/Time   CHOL 108 04/22/2020 0850   TRIG 393.0 (H) 04/22/2020 0850   HDL 28.60 (L) 04/22/2020 0850   CHOLHDL 4 04/22/2020 0850   VLDL 78.6 (H) 04/22/2020 0850   LDLCALC 17 03/17/2017 1106   LDLDIRECT 41.0 04/22/2020 0850    Hepatic Function Latest Ref Rng & Units 08/14/2020 04/22/2020 01/18/2020  Total Protein 6.0 - 8.3 g/dL 6.9 6.8 6.7  Albumin 3.5 - 5.2 g/dL 4.3 4.3 4.0  AST 0 - 37 U/L '13 13 12  ' ALT 0 - 53 U/L '12 13 11  ' Alk Phosphatase 39 - 117 U/L 51 44  45  Total Bilirubin 0.2 - 1.2 mg/dL 0.3 0.6 0.6    Lab Results  Component Value Date/Time   TSH 2.25 09/27/2019 10:17 AM   TSH 2.96 03/29/2019 02:27 PM    CBC Latest Ref Rng & Units 09/27/2019 03/29/2019 01/17/2019  WBC 4.0 - 10.5 K/uL 9.0 10.7(H) 11.4(H)  Hemoglobin 13.0 - 17.0 g/dL 12.9(L) 12.8(L) 13.2  Hematocrit 39.0 - 52.0 % 37.9(L) 37.9(L) 38.3(L)  Platelets 150.0 - 400.0 K/uL 233.0 277.0 283    Lab Results  Component Value Date/Time   VD25OH 31.98 03/11/2016 10:19 AM    Clinical ASCVD: No    Social History   Tobacco Use  Smoking Status Never Smoker  Smokeless Tobacco Never Used   BP Readings from Last 3 Encounters:  07/15/20 140/66  05/08/20 122/78  04/03/20 136/84   Pulse Readings from Last 3 Encounters:  07/15/20 (!) 101  05/08/20 94  04/03/20 83   Wt Readings from Last 3 Encounters:  07/15/20 204 lb 9.6 oz (92.8 kg)  05/08/20 204 lb (92.5 kg)  04/03/20 207 lb 9.6 oz (94.2 kg)    Assessment: Review of patient past medical history, allergies, medications, health status, including review of consultants reports, laboratory and other test data, was performed as part of comprehensive evaluation and provision of chronic care management services.   SDOH:  (Social Determinants of Health) assessments  and interventions performed:  SDOH Interventions   Flowsheet Row Most Recent Value  SDOH Interventions   Financial Strain Interventions Other (Comment)  [manufacturer assistance]      CCM Care Plan  Allergies  Allergen Reactions  . Azithromycin Rash    Medications Reviewed Today    Reviewed by De Hollingshead, RPH-CPP (Pharmacist) on 08/06/20 at 1341  Med List Status: <None>  Medication Order Taking? Sig Documenting Provider Last Dose Status Informant  allopurinol (ZYLOPRIM) 300 MG tablet [id number] Yes TAKE 1 TABLET(300 MG) BY MOUTH DAILY Crecencio Mc, MD Taking Active   atorvastatin (LIPITOR) 20 MG tablet [id number] Yes TAKE 1 TABLET(20 MG) BY MOUTH  DAILY Crecencio Mc, MD Taking Active   Cyanocobalamin ([employer] VITAMIN B-12) 5000 MCG SUBL [id number] Yes Place 5,000 mcg under the tongue daily. [provider] Taking Active   glipiZIDE (GLUCOTROL XL) 5 MG 24 hr tablet [id number] Yes Take 1 tablet (5 mg total) by mouth daily with breakfast. Crecencio Mc, MD Taking Active            Med Note De Hollingshead   Tue Aug 06, 2020  1:38 PM) Taking 2.5 mg QAM  hydrochlorothiazide (HYDRODIURIL) 25 MG tablet [id number] Yes TAKE 1 TABLET(25 MG) BY MOUTH DAILY Crecencio Mc, MD Taking Active   losartan (COZAAR) 100 MG tablet [id number] Yes TAKE 1 TABLET(100 MG) BY MOUTH DAILY Crecencio Mc, MD Taking Active   meclizine (ANTIVERT) 25 MG tablet [id number] Yes Take 25 mg by mouth in the morning. For dizziness [provider] Taking Active   metFORMIN (GLUCOPHAGE) 850 MG tablet [id number] Yes TAKE 1 TABLET(850 MG) BY MOUTH TWICE DAILY WITH A MEAL Crecencio Mc, MD Taking Active   warfarin (COUMADIN) 3 MG tablet [id number] Yes TAKE 1 TABLET(3 MG) BY MOUTH DAILY Crecencio Mc, MD Taking Active           Patient Active Problem List   Diagnosis Date Noted  . CKD stage 3 due to type 2 diabetes mellitus ([place]) 07/15/2020  . Aortic atherosclerosis ([place]) 07/15/2020  . B12 deficiency 04/24/2020  . Acquired thrombophilia ([place]) 04/05/2020  . Vertigo of central origin 03/26/2018  . Cataract cortical, senile, bilateral 03/24/2018  . Fatigue 03/24/2018  . Type 2 diabetes mellitus with microalbuminuria ([place]) 07/02/2017  . Left knee pain 03/22/2017  . Erectile dysfunction 04/21/2016  . Anticoagulation monitoring, INR range 2-3 09/10/2014  . Encounter for preventive measure 03/13/2014  . Hyperlipidemia   . Polyarthritis of multiple sites 02/05/2014  . Long term current use of anticoagulant therapy 12/08/2013  . Hyperlipidemia associated with type 2 diabetes mellitus ([place]) 03/03/2012  . Nephropathy, diabetic ([place]) 08/31/2011  . Patent  foramen ovale   . 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HCC)   . Hypertension 04/23/2011  . Screening for colon cancer 04/22/2011  . OSA (obstructive sleep apnea) 04/22/2011  . Clotting disorder ([place])   . History of renal calculi   . Gout, arthritis   . Pulmonary nodule, right     Immunization History  Administered Date(s) Administered  . Influenza Split 03/02/2012  . Influenza, High Dose Seasonal PF 03/12/2014, 03/11/2015, 03/11/2016, 03/17/2017, 03/24/2018  . Influenza,inj,Quad PF,6+ Mos 02/20/2013  . Influenza,inj,quad, With Preservative 03/17/2017  . Influenza-Unspecified 04/09/2019, 03/08/2020  . PFIZER(Purple Top)SARS-COV-2 Vaccination 06/01/2019, 06/22/2019, 02/23/2020  . Pneumococcal Conjugate-13 09/06/2013  . Pneumococcal Polysaccharide-23 04/22/2011  . Pneumococcal-Unspecified 02/23/2016  . Tdap 04/29/2011  . Zoster 03/23/2014  . Zoster Recombinat (Shingrix) 07/08/2017, 09/28/2017  Conditions to be addressed/monitored: HTN, HLD, DMII and CKD  Care Plan : Medication Management  Updates made by De Hollingshead, RPH-CPP since 08/28/2020 12:00 AM    Problem: Diabetes, Hypertension, Hyperlipidemia     Long-Range Goal: Disease Progression Prevention   Start Date: 08/06/2020  Recent Progress: On track  Priority: High  Note:   . Unable to independently afford treatment regimen . Unable to achieve control of diabetes   Pharmacist Clinical Goal(s):  [name] Over the next 90 days, patient will achieve control of diabetes as evidenced by A1c through collaboration with PharmD and provider.   Interventions: . 1:1 collaboration with Crecencio Mc, MD regarding development and update of comprehensive plan of care as evidenced by provider attestation and co-signature . Inter-disciplinary care team collaboration (see longitudinal plan of care) . Comprehensive medication review performed; medication list updated in electronic medical record  Diabetes: . Uncontrolled;  current treatment: metformin 850 mg BID, Farxiga 5 mg daily . Denies any genitourinary concerns since starting [place]. . Current glucose readings:   Fasting  18-Mar 136  19-Mar 146  20-Mar 158  21-Mar 136  22-Mar 154  23-Mar 168  24-Mar 147  25-Mar 179  26-Mar 149  27-Mar 204  28-Mar 164  29-Mar 177  30-Mar 156  AVG 159   . Continue current regimen at this time. Submitted [place] application to [employer] patient assistance. Will collaborate w/ CPhT for follow up.  . Will likely plan to increase dose of Farxiga moving forward.   Hypertension: . Uncontrolled at last office visit; current treatment: HCTZ 25 mg QAM, losartan 100 mg QAM . Current home readings:   SBP DBP  18-Mar 123 70  19-Mar 112 70  20-Mar 95 57  21-Mar 122 72  22-Mar 116 83  23-Mar 128 76  24-Mar 128 78  25-Mar 138 81  26-Mar 90 58  27-Mar 110 76  28-Mar 160 90  29-Mar 117 71  AVG 120 74   . Continue current regimen at this time.    Hyperlipidemia: . Controlled per last lipid panel; current treatment: atorvastatin 20 mg daily   . Previously recommended to continue current regimen at this time  Gout: . Controlled per patient report; current regimen: allopurinol 300 mg daily . Uric acid at goal <6 . Continue current regimen at this time  Clotting Disorder: . Appropriately managed; current regimen: warfarin 3 mg daily INR goal 2-3, managed by PCP . Previously recommended to continue current regimen along with PCP collaboration at this time  Supplements: [name] Vitamin B12 5000 mcg daily   Patient Goals/Self-Care Activities . Over the next 90 days, patient will:  - take medications as prescribed check glucose daily, document, and provide at future appointments check blood pressure periodically, document, and provide at future appointments collaborate with provider on medication access solutions  Follow Up Plan: Telephone follow up appointment with care management team member scheduled for: ~  3 weeks as previously scheduled     Medication Assistance: Application for Farxiga   medication assistance program. in process.  Anticipated assistance start date TBD.  See plan of care for additional detail.  Patient's preferred pharmacy is:  [facility] STORE #[id number] [place], [place] - [address] AT [facility] [place] [address] Phone: [phone] Fax: [phone]  Uses pill box? Yes Pt endorses 100% compliance   Follow Up:  Patient agrees to Care Plan and Follow-up.  Plan: Telephone follow up appointment with care management team  member scheduled for:  ~ 4 weeks as previously scheduled  Catie Darnelle Maffucci, PharmD, [place], [place] Clinical Pharmacist [employer] at [employer] [phone]

## 2020-09-02 ENCOUNTER — Telehealth: Payer: Self-pay | Admitting: Pharmacy Technician

## 2020-09-02 DIAGNOSIS — Z596 Low income: Secondary | ICD-10-CM

## 2020-09-02 NOTE — Progress Notes (Signed)
[facility])                                            [facility] Quality Pharmacy Team    09/02/2020  Stephen Kelly  11/17/1940 [id number]   Received both patient and provider portion(s) of patient assistance application(s) for [place]. Faxed completed application and required documents into [place].  Renada Cronin P. Indyah Saulnier, CPhT [facility] [employer]  [phone]

## 2020-09-04 ENCOUNTER — Telehealth: Payer: Self-pay | Admitting: Pharmacy Technician

## 2020-09-04 DIAGNOSIS — Z596 Low income: Secondary | ICD-10-CM

## 2020-09-04 NOTE — Progress Notes (Signed)
[facility])                                            [facility] Quality Pharmacy Team    09/04/2020  Stephen Kelly December 24, 1940 [id number]   Care coordination call placed to [place]ME in regards to patient's Farxiga application.  Spoke to [facility] who informed patient was APPROVED 08/29/20-05/24/21.  [name] P. Esmirna Ravan, CPhT [facility] [employer]  [phone]

## 2020-09-05 ENCOUNTER — Ambulatory Visit: Payer: Medicare Other | Admitting: Pharmacist

## 2020-09-05 DIAGNOSIS — E1169 Type 2 diabetes mellitus with other specified complication: Secondary | ICD-10-CM

## 2020-09-05 DIAGNOSIS — E1129 Type 2 diabetes mellitus with other diabetic kidney complication: Secondary | ICD-10-CM

## 2020-09-05 DIAGNOSIS — E1122 Type 2 diabetes mellitus with diabetic chronic kidney disease: Secondary | ICD-10-CM

## 2020-09-05 DIAGNOSIS — E785 Hyperlipidemia, unspecified: Secondary | ICD-10-CM

## 2020-09-05 NOTE — Patient Instructions (Signed)
Visit Information  PATIENT GOALS: Goals Addressed              This Visit's Progress     Patient Stated   .  Medication Monitoring (pt-stated)        Patient Goals/Self-Care Activities . Over the next 90 days, patient will:  - take medications as prescribed check glucose daily, document, and provide at future appointments check blood pressure periodically, document, and provide at future appointments collaborate with provider on medication access solutions       Patient verbalizes understanding of instructions provided today and agrees to view in MyChart.  Plan: Telephone follow up appointment with care management team member scheduled for:  ~ 3 weeks  Catie Feliz Beam, PharmD, [place], CPP Clinical Pharmacist [employer] at [employer] [phone]

## 2020-09-05 NOTE — Chronic Care Management (AMB) (Signed)
Chronic Care Management Pharmacy Note  09/05/2020 Name:  Stephen Kelly MRN:  [id number] DOB:  October 26, 1940  Subjective: Stephen Kelly is an 80 y.o. year old male who is a primary patient of Tullo, Aris Everts, MD.  The CCM team was consulted for assistance with disease management and care coordination needs.    Engaged with patient by telephone for response to call about medication access in response to provider referral for pharmacy case management and/or care coordination services.   Consent to Services:  The patient was given information about Chronic Care Management services, agreed to services, and gave verbal consent prior to initiation of services.  Please see initial visit note for detailed documentation.   Patient Care Team: Crecencio Mc, MD as PCP - General (Internal Medicine) De Hollingshead, RPH-CPP (Pharmacist)  Recent office visits: None since our last call  Recent consult visits: None since our last call  Hospital visits: None in previous 6 months  Objective:  Lab Results  Component Value Date   CREATININE 1.83 (H) 08/14/2020   CREATININE 1.46 04/22/2020   CREATININE 1.57 (H) 01/18/2020    Lab Results  Component Value Date   HGBA1C 7.5 (H) 08/14/2020   Last diabetic Eye exam:  Lab Results  Component Value Date/Time   HMDIABEYEEXA No Retinopathy 11/23/2017 03:48 PM    Last diabetic Foot exam:  Lab Results  Component Value Date/Time   HMDIABFOOTEX NORMAL 03/12/2014 12:00 AM        Component Value Date/Time   CHOL 108 04/22/2020 0850   TRIG 393.0 (H) 04/22/2020 0850   HDL 28.60 (L) 04/22/2020 0850   CHOLHDL 4 04/22/2020 0850   VLDL 78.6 (H) 04/22/2020 0850   LDLCALC 17 03/17/2017 1106   LDLDIRECT 41.0 04/22/2020 0850    Hepatic Function Latest Ref Rng & Units 08/14/2020 04/22/2020 01/18/2020  Total Protein 6.0 - 8.3 g/dL 6.9 6.8 6.7  Albumin 3.5 - 5.2 g/dL 4.3 4.3 4.0  AST 0 - 37 U/L '13 13 12  ' ALT 0 - 53 U/L '12 13 11  ' Alk Phosphatase 39 -  117 U/L 51 44 45  Total Bilirubin 0.2 - 1.2 mg/dL 0.3 0.6 0.6    Lab Results  Component Value Date/Time   TSH 2.25 09/27/2019 10:17 AM   TSH 2.96 03/29/2019 02:27 PM    CBC Latest Ref Rng & Units 09/27/2019 03/29/2019 01/17/2019  WBC 4.0 - 10.5 K/uL 9.0 10.7(H) 11.4(H)  Hemoglobin 13.0 - 17.0 g/dL 12.9(L) 12.8(L) 13.2  Hematocrit 39.0 - 52.0 % 37.9(L) 37.9(L) 38.3(L)  Platelets 150.0 - 400.0 K/uL 233.0 277.0 283    Lab Results  Component Value Date/Time   VD25OH 31.98 03/11/2016 10:19 AM    Clinical ASCVD: No    Social History   Tobacco Use  Smoking Status Never Smoker  Smokeless Tobacco Never Used   BP Readings from Last 3 Encounters:  07/15/20 140/66  05/08/20 122/78  04/03/20 136/84   Pulse Readings from Last 3 Encounters:  07/15/20 (!) 101  05/08/20 94  04/03/20 83   Wt Readings from Last 3 Encounters:  07/15/20 204 lb 9.6 oz (92.8 kg)  05/08/20 204 lb (92.5 kg)  04/03/20 207 lb 9.6 oz (94.2 kg)    Assessment: Review of patient past medical history, allergies, medications, health status, including review of consultants reports, laboratory and other test data, was performed as part of comprehensive evaluation and provision of chronic care management services.   SDOH:  (Social Determinants of Health)  assessments and interventions performed:  SDOH Interventions   Flowsheet Row Most Recent Value  SDOH Interventions   Financial Strain Interventions Other (Comment)  [manufacturer assistance]      CCM Care Plan  Allergies  Allergen Reactions  . Azithromycin Rash    Medications Reviewed Today    Reviewed by De Hollingshead, RPH-CPP (Pharmacist) on 08/06/20 at 1341  Med List Status: <None>  Medication Order Taking? Sig Documenting Provider Last Dose Status Informant  allopurinol (ZYLOPRIM) 300 MG tablet [id number] Yes TAKE 1 TABLET(300 MG) BY MOUTH DAILY Crecencio Mc, MD Taking Active   atorvastatin (LIPITOR) 20 MG tablet [id number] Yes TAKE 1 TABLET(20  MG) BY MOUTH DAILY Crecencio Mc, MD Taking Active   Cyanocobalamin ([employer]S VITAMIN B-12) 5000 MCG SUBL [id number] Yes Place 5,000 mcg under the tongue daily. [provider] Taking Active   glipiZIDE (GLUCOTROL XL) 5 MG 24 hr tablet [id number] Yes Take 1 tablet (5 mg total) by mouth daily with breakfast. Crecencio Mc, MD Taking Active            Med Note De Hollingshead   Tue Aug 06, 2020  1:38 PM) Taking 2.5 mg QAM  hydrochlorothiazide (HYDRODIURIL) 25 MG tablet [id number] Yes TAKE 1 TABLET(25 MG) BY MOUTH DAILY Crecencio Mc, MD Taking Active   losartan (COZAAR) 100 MG tablet [id number] Yes TAKE 1 TABLET(100 MG) BY MOUTH DAILY Crecencio Mc, MD Taking Active   meclizine (ANTIVERT) 25 MG tablet [id number] Yes Take 25 mg by mouth in the morning. For dizziness [provider] Taking Active   metFORMIN (GLUCOPHAGE) 850 MG tablet [id number] Yes TAKE 1 TABLET(850 MG) BY MOUTH TWICE DAILY WITH A MEAL Crecencio Mc, MD Taking Active   warfarin (COUMADIN) 3 MG tablet [id number] Yes TAKE 1 TABLET(3 MG) BY MOUTH DAILY Crecencio Mc, MD Taking Active           Patient Active Problem List   Diagnosis Date Noted  . CKD stage 3 due to type 2 diabetes mellitus ([place]) 07/15/2020  . Aortic atherosclerosis ([place]) 07/15/2020  . B12 deficiency 04/24/2020  . Acquired thrombophilia ([place]) 04/05/2020  . Vertigo of central origin 03/26/2018  . Cataract cortical, senile, bilateral 03/24/2018  . Fatigue 03/24/2018  . Type 2 diabetes mellitus with microalbuminuria ([place]) 07/02/2017  . Left knee pain 03/22/2017  . Erectile dysfunction 04/21/2016  . Anticoagulation monitoring, INR range 2-3 09/10/2014  . Encounter for preventive measure 03/13/2014  . Hyperlipidemia   . Polyarthritis of multiple sites 02/05/2014  . Long term current use of anticoagulant therapy 12/08/2013  . Hyperlipidemia associated with type 2 diabetes mellitus ([place]) 03/03/2012  . Nephropathy, diabetic ([place]) 08/31/2011   . Patent foramen ovale   . 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HCC)   . Hypertension 04/23/2011  . Screening for colon cancer 04/22/2011  . OSA (obstructive sleep apnea) 04/22/2011  . Clotting disorder ([place])   . History of renal calculi   . Gout, arthritis   . Pulmonary nodule, right     Immunization History  Administered Date(s) Administered  . Influenza Split 03/02/2012  . Influenza, High Dose Seasonal PF 03/12/2014, 03/11/2015, 03/11/2016, 03/17/2017, 03/24/2018  . Influenza,inj,Quad PF,6+ Mos 02/20/2013  . Influenza,inj,quad, With Preservative 03/17/2017  . Influenza-Unspecified 04/09/2019, 03/08/2020  . PFIZER(Purple Top)SARS-COV-2 Vaccination 06/01/2019, 06/22/2019, 02/23/2020  . Pneumococcal Conjugate-13 09/06/2013  . Pneumococcal Polysaccharide-23 04/22/2011  . Pneumococcal-Unspecified 02/23/2016  . Tdap 04/29/2011  . Zoster 03/23/2014  . Zoster Recombinat (Shingrix) 07/08/2017, 09/28/2017  Conditions to be addressed/monitored: HTN, HLD and DMII  Care Plan : Medication Management  Updates made by De Hollingshead, RPH-CPP since 09/05/2020 12:00 AM    Problem: Diabetes, Hypertension, Hyperlipidemia     Long-Range Goal: Disease Progression Prevention   Start Date: 08/06/2020  This Visit's Progress: On track  Recent Progress: On track  Priority: High  Note:   . Unable to independently afford treatment regimen . Unable to achieve control of diabetes   Pharmacist Clinical Goal(s):  [name] Over the next 90 days, patient will achieve control of diabetes as evidenced by A1c through collaboration with PharmD and provider.   Interventions: . 1:1 collaboration with Crecencio Mc, MD regarding development and update of comprehensive plan of care as evidenced by provider attestation and co-signature . Inter-disciplinary care team collaboration (see longitudinal plan of care) . Comprehensive medication review performed; medication list updated in electronic medical  record  Diabetes: . Uncontrolled; current treatment: metformin 850 mg BID, Farxiga 5 mg daily . Calls today as he has 1 Farxiga pill left, patient assistance as been approved but he has not received yet. Pharmacy does not have any in stock . Prepared Farxiga sample. Will continue to collaborate w/ CPhT on patient assistance . Will likely plan to increase dose of Farxiga moving forward.   Hypertension: . Uncontrolled at last office visit; current treatment: HCTZ 25 mg QAM, losartan 100 mg QAM . Previously recommended to continue current regimen at this time.    Hyperlipidemia: . Controlled per last lipid panel; current treatment: atorvastatin 20 mg daily   . Previously recommended to continue current regimen at this time  Gout: . Controlled per patient symptom report and uric acid; current regimen: allopurinol 300 mg daily . Uric acid at goal <6 . Previously recommended to continue current regimen at this time  Clotting Disorder: . Appropriately managed; current regimen: warfarin 3 mg daily INR goal 2-3, managed by PCP . Previously recommended to continue current regimen along with PCP collaboration at this time  Supplements: [name] Vitamin B12 5000 mcg daily   Patient Goals/Self-Care Activities . Over the next 90 days, patient will:  - take medications as prescribed check glucose daily, document, and provide at future appointments check blood pressure periodically, document, and provide at future appointments collaborate with provider on medication access solutions  Follow Up Plan: Telephone follow up appointment with care management team member scheduled for: ~ 2 weeks as previously scheduled     Medication Assistance: Wilder Glade obtained through [employer]  medication assistance program.  Enrollment ends 05/24/21  Patient's preferred pharmacy is:  [facility], [place] - [place] AT [facility] [address] [place] [address] Phone:  [phone] Fax: [phone]    Follow Up:  Patient agrees to Care Plan and Follow-up.  Plan: Telephone follow up appointment with care management team member scheduled for:  ~ 3 weeks  Catie Darnelle Maffucci, PharmD, [place], [place] Clinical Pharmacist [employer] at [employer] [phone]

## 2020-09-11 ENCOUNTER — Ambulatory Visit: Payer: Medicare Other | Admitting: Pharmacist

## 2020-09-11 DIAGNOSIS — E1122 Type 2 diabetes mellitus with diabetic chronic kidney disease: Secondary | ICD-10-CM

## 2020-09-11 DIAGNOSIS — E1169 Type 2 diabetes mellitus with other specified complication: Secondary | ICD-10-CM

## 2020-09-11 DIAGNOSIS — N183 Chronic kidney disease, stage 3 unspecified: Secondary | ICD-10-CM

## 2020-09-11 DIAGNOSIS — E1129 Type 2 diabetes mellitus with other diabetic kidney complication: Secondary | ICD-10-CM | POA: Diagnosis not present

## 2020-09-11 DIAGNOSIS — M109 Gout, unspecified: Secondary | ICD-10-CM

## 2020-09-11 DIAGNOSIS — E785 Hyperlipidemia, unspecified: Secondary | ICD-10-CM

## 2020-09-11 DIAGNOSIS — D689 Coagulation defect, unspecified: Secondary | ICD-10-CM

## 2020-09-11 DIAGNOSIS — I700 Atherosclerosis of aorta: Secondary | ICD-10-CM

## 2020-09-11 DIAGNOSIS — R809 Proteinuria, unspecified: Secondary | ICD-10-CM

## 2020-09-11 DIAGNOSIS — Z7901 Long term (current) use of anticoagulants: Secondary | ICD-10-CM

## 2020-09-11 NOTE — Patient Instructions (Addendum)
[name],   It was great talking to you today  Please keep checking your blood sugars and writing down the results. Check first thing in the morning, before breakfast, but also check sometimes about 2 hours after your biggest meal. Our goal for an A1c of <7% is for fasting readings to be less than 130 and 2 hour after meal readings to be less than 180. Write down these results. Bring these to your next appointment with Dr. Darrick Huntsman.  Schedule your yearly diabetic eye exam.  The Farxiga tracking information says your medication should be delivered by end of the day on Monday, April 25th. Call me if you have not received this medication by Friday, April 29th,   Please check your blood pressures 1-2 times weekly and write down these results. Bring these to your next appointment with Dr. Darrick Huntsman.  STOP TAKING MECLIZINE EVERY DAY. This should only be used AS NEEDED for episodes of vertigo.   Here is what you should be taking every day:   - Allopurinol 300 mg  - gout prevention - Atorvastatin 20 mg - cholesterol, prevention of heart attacks/strokes - Farxiga 5 mg - diabetes - HCTZ 25 mg  - blood pressure - Losartan 100 mg  - blood pressure - Metformin 850 BID - diabetes - Warfarin 3 - anticoagulant, prevention of blood clots  Call me with any questions or concerns!  Catie Feliz Beam, PharmD [phone]  Visit Information  PATIENT GOALS: Goals Addressed              This Visit's Progress     Patient Stated   .  Medication Monitoring (pt-stated)        Patient Goals/Self-Care Activities . Over the next 90 days, patient will:  - take medications as prescribed check glucose daily, document, and provide at future appointments check blood pressure periodically, document, and provide at future appointments collaborate with provider on medication access solutions        The patient verbalized understanding of instructions, educational materials, and care plan provided today and agreed to  receive a mailed copy of patient instructions, educational materials, and care plan.    Plan: Telephone follow up appointment with care management team member scheduled for:  ~ 8 weeks  Catie Feliz Beam, PharmD, [place], CPP Clinical Pharmacist [employer] at [employer] [phone]

## 2020-09-11 NOTE — Chronic Care Management (AMB) (Signed)
Chronic Care Management Pharmacy Note  09/11/2020 Name:  GUADALUPE NICKLESS MRN:  [id number] DOB:  [birth year]  Subjective: JOY HAEGELE is an 80 y.o. year old male who is a primary patient of Tullo, Aris Everts, MD.  The CCM team was consulted for assistance with disease management and care coordination needs.    Engaged with patient by telephone for follow up visit in response to provider referral for pharmacy case management and/or care coordination services.   Consent to Services:  The patient was given information about Chronic Care Management services, agreed to services, and gave verbal consent prior to initiation of services.  Please see initial visit note for detailed documentation.   Patient Care Team: Crecencio Mc, MD as PCP - General (Internal Medicine) De Hollingshead, RPH-CPP (Pharmacist)  Recent office visits: None since our last visit  Recent consult visits: None since our last visit  Hospital visits: None in previous 6 months  Objective:  Lab Results  Component Value Date   CREATININE 1.83 (H) 08/14/2020   CREATININE 1.46 04/22/2020   CREATININE 1.57 (H) 01/18/2020    Lab Results  Component Value Date   HGBA1C 7.5 (H) 08/14/2020   Last diabetic Eye exam:  Lab Results  Component Value Date/Time   HMDIABEYEEXA No Retinopathy 11/23/2017 03:48 PM    Last diabetic Foot exam:  Lab Results  Component Value Date/Time   HMDIABFOOTEX NORMAL 03/12/2014 12:00 AM        Component Value Date/Time   CHOL 108 04/22/2020 0850   TRIG 393.0 (H) 04/22/2020 0850   HDL 28.60 (L) 04/22/2020 0850   CHOLHDL 4 04/22/2020 0850   VLDL 78.6 (H) 04/22/2020 0850   LDLCALC 17 03/17/2017 1106   LDLDIRECT 41.0 04/22/2020 0850    Hepatic Function Latest Ref Rng & Units 08/14/2020 04/22/2020 01/18/2020  Total Protein 6.0 - 8.3 g/dL 6.9 6.8 6.7  Albumin 3.5 - 5.2 g/dL 4.3 4.3 4.0  AST 0 - 37 U/L '13 13 12  ' ALT 0 - 53 U/L '12 13 11  ' Alk Phosphatase 39 - 117 U/L 51 44 45   Total Bilirubin 0.2 - 1.2 mg/dL 0.3 0.6 0.6    Lab Results  Component Value Date/Time   TSH 2.25 09/27/2019 10:17 AM   TSH 2.96 03/29/2019 02:27 PM    CBC Latest Ref Rng & Units 09/27/2019 03/29/2019 01/17/2019  WBC 4.0 - 10.5 K/uL 9.0 10.7(H) 11.4(H)  Hemoglobin 13.0 - 17.0 g/dL 12.9(L) 12.8(L) 13.2  Hematocrit 39.0 - 52.0 % 37.9(L) 37.9(L) 38.3(L)  Platelets 150.0 - 400.0 K/uL 233.0 277.0 283    Lab Results  Component Value Date/Time   VD25OH 31.98 03/11/2016 10:19 AM    Clinical ASCVD: No  The ASCVD Risk score Mikey Bussing DC Jr., et al., 2013) failed to calculate for the following reasons:   The valid total cholesterol range is 130 to 320 mg/dL     Social History   Tobacco Use  Smoking Status Never Smoker  Smokeless Tobacco Never Used   BP Readings from Last 3 Encounters:  07/15/20 140/66  05/08/20 122/78  04/03/20 136/84   Pulse Readings from Last 3 Encounters:  07/15/20 (!) 101  05/08/20 94  04/03/20 83   Wt Readings from Last 3 Encounters:  07/15/20 204 lb 9.6 oz (92.8 kg)  05/08/20 204 lb (92.5 kg)  04/03/20 207 lb 9.6 oz (94.2 kg)    Assessment: Review of patient past medical history, allergies, medications, health status, including review of consultants  reports, laboratory and other test data, was performed as part of comprehensive evaluation and provision of chronic care management services.   SDOH:  (Social Determinants of Health) assessments and interventions performed:  SDOH Interventions   Flowsheet Row Most Recent Value  SDOH Interventions   Financial Strain Interventions Other (Comment)  [manufacturer assisatnce]      CCM Care Plan  Allergies  Allergen Reactions  . Azithromycin Rash    Medications Reviewed Today    Reviewed by De Hollingshead, RPH-CPP (Pharmacist) on 09/11/20 at 1114  Med List Status: <None>  Medication Order Taking? Sig Documenting Provider Last Dose Status Informant  allopurinol (ZYLOPRIM) 300 MG tablet [id number] Yes  TAKE 1 TABLET(300 MG) BY MOUTH DAILY Crecencio Mc, MD Taking Active   atorvastatin (LIPITOR) 20 MG tablet [id number] Yes TAKE 1 TABLET(20 MG) BY MOUTH DAILY Crecencio Mc, MD Taking Active   Cyanocobalamin ([employer] VITAMIN B-12) 5000 MCG SUBL [id number] Yes Place 5,000 mcg under the tongue daily. [provider] Taking Active   dapagliflozin propanediol (FARXIGA) 5 MG TABS tablet [id number] Yes Take 1 tablet (5 mg total) by mouth daily. Crecencio Mc, MD Taking Active   hydrochlorothiazide (HYDRODIURIL) 25 MG tablet [id number] Yes TAKE 1 TABLET(25 MG) BY MOUTH DAILY Crecencio Mc, MD Taking Active   losartan (COZAAR) 100 MG tablet [id number] Yes TAKE 1 TABLET(100 MG) BY MOUTH DAILY Crecencio Mc, MD Taking Active   meclizine (ANTIVERT) 25 MG tablet [id number] No Take 25 mg by mouth in the morning. For dizziness  Patient not taking: Reported on 09/11/2020   [provider] Not Taking Active   metFORMIN (GLUCOPHAGE) 850 MG tablet [id number] Yes TAKE 1 TABLET(850 MG) BY MOUTH TWICE DAILY WITH A MEAL Crecencio Mc, MD Taking Active   warfarin (COUMADIN) 3 MG tablet [id number] Yes TAKE 1 TABLET(3 MG) BY MOUTH DAILY Crecencio Mc, MD Taking Active           Patient Active Problem List   Diagnosis Date Noted  . CKD stage 3 due to type 2 diabetes mellitus ([place]) 07/15/2020  . Aortic atherosclerosis ([place]) 07/15/2020  . B12 deficiency 04/24/2020  . Acquired thrombophilia ([place]) 04/05/2020  . Vertigo of central origin 03/26/2018  . Cataract cortical, senile, bilateral 03/24/2018  . Fatigue 03/24/2018  . Type 2 diabetes mellitus with microalbuminuria ([place]) 07/02/2017  . Left knee pain 03/22/2017  . Erectile dysfunction 04/21/2016  . Anticoagulation monitoring, INR range 2-3 09/10/2014  . Encounter for preventive measure 03/13/2014  . Hyperlipidemia   . Polyarthritis of multiple sites 02/05/2014  . Long term current use of anticoagulant therapy 12/08/2013  . Hyperlipidemia  associated with type 2 diabetes mellitus ([place]) 03/03/2012  . Nephropathy, diabetic ([place]) 08/31/2011  . Patent foramen ovale   . 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[facility])   . Hypertension 04/23/2011  . Screening for colon cancer 04/22/2011  . OSA (obstructive sleep apnea) 04/22/2011  . Clotting disorder ([place])   . History of renal calculi   . Gout, arthritis   . Pulmonary nodule, right     Immunization History  Administered Date(s) Administered  . Influenza Split 03/02/2012  . Influenza, High Dose Seasonal PF 03/12/2014, 03/11/2015, 03/11/2016, 03/17/2017, 03/24/2018  . Influenza,inj,Quad PF,6+ Mos 02/20/2013  . Influenza,inj,quad, With Preservative 03/17/2017  . Influenza-Unspecified 04/09/2019, 03/08/2020  . PFIZER(Purple Top)SARS-COV-2 Vaccination 06/01/2019, 06/22/2019, 02/23/2020  . Pneumococcal Conjugate-13 09/06/2013  . Pneumococcal Polysaccharide-23 04/22/2011  . Pneumococcal-Unspecified 02/23/2016  . Tdap 04/29/2011  . Zoster 03/23/2014  .  Zoster Recombinat (Shingrix) 07/08/2017, 09/28/2017    Conditions to be addressed/monitored: HTN, HLD and DMII  Care Plan : Medication Management  Updates made by De Hollingshead, RPH-CPP since 09/11/2020 12:00 AM    Problem: Diabetes, Hypertension, Hyperlipidemia     Long-Range Goal: Disease Progression Prevention   Start Date: 08/06/2020  This Visit's Progress: On track  Recent Progress: On track  Priority: High  Note:   . Unable to independently afford treatment regimen . Unable to achieve control of diabetes   Pharmacist Clinical Goal(s):  [name] Over the next 90 days, patient will achieve control of diabetes as evidenced by A1c through collaboration with PharmD and provider.   Interventions: . 1:1 collaboration with Crecencio Mc, MD regarding development and update of comprehensive plan of care as evidenced by provider attestation and co-signature . Inter-disciplinary care team collaboration (see longitudinal plan  of care) . Comprehensive medication review performed; medication list updated in electronic medical record  Health Maintenance: . Due for eye exam. Wonders why he needs to get this if eye exams are always normal. Discussed principals of preventative care. Patient to schedule yearly eye exam and have results faxed to our office.   Diabetes: . Uncontrolled; current treatment: metformin 850 mg BID, Farxiga 5 mg daily o Renal function limits dose maximization of metformin. Most recent BMP was within the acute phase after starting SGLT2, which could have caused increase in Scr. . Current glucose readings: reports fastings 130-160s . Current meal patterns: breakfast: 1 egg, bacon, sometimes ham sandwich, sometimes biscuit; diet pepsi or Dr. Malachi Bonds; lunch: skips if he has a larger breakfast; supper: variable; raw cabbage, hamburger, vegetables; avoids potatoes; drink: water;  . Current physical activity: mows yards last weeks;  . Approved for Farxiga assistance for 2022. Shipment should arrive by Monday.  [name] Extensive dietary discussion. Recommend increased water intake, using only diet sodas instead of regular Dr. Malachi Bonds. Discussed focus on lean meats, vegetables, reduced carbohydrates.  . Discussed increasing physical activity. Patient notes he likes to go to the mall and walk. Discussed setting an attainable goal of walking 1-3 times weekly. Patient agreeable to this . Recommend repeat BMP in the future prior to discontinuation of metformin, as last BMP was in the setting of recent start of SGLT2. If next eGFR <45 and no suspicion of dehydration, will discuss metformin discontinuation . Continue current regimen at this time. Consider Farxiga dose increase moving forward.   Hypertension: . Uncontrolled at last office visit; current treatment: HCTZ 25 mg QAM, losartan 100 mg QAM . Home readings: notes he checked last week, but does not remember the reading.  . Notes he occasionally salts his food.  Extensive discussion of impact of sodium on BP. Patient notes he will try not salting his food.  . Encouraged to write down occasional home BP readings. Encouraged to check BP at least once weekly.  . Recommended to continue current regimen at this time.    Hyperlipidemia: . Controlled per last lipid panel; current treatment: atorvastatin 20 mg daily   . Previously recommended to continue current regimen at this time  Gout: . Controlled per patient symptom report and uric acid; current regimen: allopurinol 300 mg daily . Uric acid at goal <6 . Previously recommended to continue current regimen at this time  Clotting Disorder: . Overdue for recheck; current regimen: warfarin 3 mg daily INR goal 2-3, managed by PCP . Overdue for INR check, last checked in 06/2020. PT/INR ordered. Patient refuses a lab appointment  that is not at 8 am, scheduled for the soonest available 8:15 am lab appointment in ~ 3 weeks.  . Reviewed clinical history. Brachiocephalic artery thrombus requiring thrombectomy in 2011. Per chart review of [facility] tab, 07/2020 lab work by Dr. Hoy Register 07/2010 noted "No lupus anticoagulant detected. PTT-LAA results are consistent with a deficiency of one or more intrinsic pathway factors". Visit note from Dr. Alba Destine 07/2010 references a prior positive lupus inhibitor result, but references that "f/u lupus anticoagulant not detected, ANA positive". Cardiology worked up for patent foramen ovale. 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  [name] Patient expresses frustration about having to come for INR testing monthly, given "I'm always in range". Discussed need for regular follow up with warfarin therapy given multiple drug/disease/food interactions. Reviewed impact of irregular Vitamin K.  . Given variability in lab results relating to lupus anticoagulant and advent of more recent data w/ DOAC, could consider  referral back to hematology to discuss if future transition to [place] would be clinically appropriate. Would require conversation regarding cost/benefit of DOAC vs warfarin + regular testing. Can support with discussion regarding patient assistance.   Supplements: [name] Vitamin B12 5000 mcg daily   Patient Goals/Self-Care Activities . Over the next 90 days, patient will:  - take medications as prescribed check glucose daily, document, and provide at future appointments check blood pressure periodically, document, and provide at future appointments collaborate with provider on medication access solutions  Follow Up Plan: Telephone follow up appointment with care management team member scheduled for: ~ 8 weeks (PCP in ~ 4)     Medication Assistance: None required.  Patient affirms current coverage meets needs.  Patient's preferred pharmacy is:  [id number] [address], [place] - [place] AT [facility] [place] [address] Phone: [phone] Fax: [phone]   Follow Up:  Patient agrees to Care Plan and Follow-up.  Plan: Telephone follow up appointment with care management team member scheduled for:  ~ 8 weeks  Catie Darnelle Maffucci, PharmD, [place], [place] Clinical Pharmacist [employer] at [employer] [phone]

## 2020-09-17 ENCOUNTER — Other Ambulatory Visit: Payer: Self-pay | Admitting: Internal Medicine

## 2020-09-19 ENCOUNTER — Telehealth: Payer: Self-pay

## 2020-09-19 DIAGNOSIS — I10 Essential (primary) hypertension: Secondary | ICD-10-CM | POA: Diagnosis not present

## 2020-09-19 DIAGNOSIS — R8290 Unspecified abnormal findings in urine: Secondary | ICD-10-CM | POA: Diagnosis not present

## 2020-09-19 DIAGNOSIS — N1832 Chronic kidney disease, stage 3b: Secondary | ICD-10-CM | POA: Diagnosis not present

## 2020-09-19 DIAGNOSIS — E1122 Type 2 diabetes mellitus with diabetic chronic kidney disease: Secondary | ICD-10-CM | POA: Diagnosis not present

## 2020-09-19 NOTE — Telephone Encounter (Signed)
PA for [name] has been submitted on covermymeds.

## 2020-09-20 ENCOUNTER — Other Ambulatory Visit: Payer: Self-pay | Admitting: Nephrology

## 2020-09-20 DIAGNOSIS — N1832 Chronic kidney disease, stage 3b: Secondary | ICD-10-CM

## 2020-09-20 DIAGNOSIS — E1122 Type 2 diabetes mellitus with diabetic chronic kidney disease: Secondary | ICD-10-CM

## 2020-09-20 DIAGNOSIS — R8290 Unspecified abnormal findings in urine: Secondary | ICD-10-CM

## 2020-09-20 NOTE — Telephone Encounter (Signed)
PA request for dapagliflozin propanediol (FARXIGA) 5 MG TABS tablet was denied per rep from [facility]. Rep did mention that the PT had mention he was getting this through a program that gave it to him for free of charge.

## 2020-09-23 NOTE — Telephone Encounter (Signed)
Spoke with pt to verify that he was getting his medication through a program and he stated that Catie set him up with a company and they mail him the medication every 90 days.

## 2020-09-30 ENCOUNTER — Other Ambulatory Visit: Payer: Self-pay

## 2020-09-30 MED ORDER — METFORMIN HCL 850 MG PO TABS: ORAL_TABLET | ORAL | 1 refills | Status: DC

## 2020-10-16 ENCOUNTER — Ambulatory Visit: Payer: Medicare Other | Admitting: Internal Medicine

## 2020-10-31 ENCOUNTER — Other Ambulatory Visit: Payer: Self-pay

## 2020-10-31 ENCOUNTER — Telehealth: Payer: Self-pay

## 2020-10-31 ENCOUNTER — Other Ambulatory Visit (INDEPENDENT_AMBULATORY_CARE_PROVIDER_SITE_OTHER): Payer: Medicare Other

## 2020-10-31 DIAGNOSIS — D689 Coagulation defect, unspecified: Secondary | ICD-10-CM | POA: Diagnosis not present

## 2020-10-31 DIAGNOSIS — Z7901 Long term (current) use of anticoagulants: Secondary | ICD-10-CM

## 2020-10-31 LAB — PROTIME-INR
INR: 2.2 ratio — ABNORMAL HIGH (ref 0.8–1.0)
Prothrombin Time: 24.4 s — ABNORMAL HIGH (ref 9.6–13.1)

## 2020-10-31 NOTE — Telephone Encounter (Signed)
Patient presented in the office today for a Blood Draw. I was informed that the Patient is waiting in the hallway for something to be written down. Went and spoke to Stephen Kelly and he requested the normal ranges for his heart rate, his blood sugar from a finger stick, and his blood pressure. I went back to pod and discussed with Stephen Kelly, CMA and Stephen Fuller, FNP and wrote on a large note sheet "Normal Ranges. Blood Pressure -120/80. Blood Sugar (Fasting)- 60-100. Blood Sugar ( 2 hours after eating) - 120-169. Heart Rate - 60-100." Also confirmed patients DOB and gave him a printed copy of his last lab results so he can see his blood levels and the reference ranges listed. Stephen Kelly explained that he was given logs by Stephen Kelly to give to Stephen Kelly at his next appointment and wanted to be sure that his numbers were normal. Stephen Kelly voiced his gratitude and then left through the Check Out exit.

## 2020-11-04 ENCOUNTER — Other Ambulatory Visit: Payer: Self-pay

## 2020-11-04 ENCOUNTER — Ambulatory Visit
Admission: RE | Admit: 2020-11-04 | Discharge: 2020-11-04 | Disposition: A | Discharge status: Home / Self Care | Payer: Medicare Other | Source: Ambulatory Visit | Attending: Nephrology | Admitting: Nephrology

## 2020-11-04 DIAGNOSIS — E1122 Type 2 diabetes mellitus with diabetic chronic kidney disease: Secondary | ICD-10-CM

## 2020-11-04 DIAGNOSIS — R8290 Unspecified abnormal findings in urine: Secondary | ICD-10-CM | POA: Diagnosis not present

## 2020-11-04 DIAGNOSIS — N1832 Chronic kidney disease, stage 3b: Secondary | ICD-10-CM

## 2020-11-04 DIAGNOSIS — N281 Cyst of kidney, acquired: Secondary | ICD-10-CM | POA: Diagnosis not present

## 2020-11-07 DIAGNOSIS — R809 Proteinuria, unspecified: Secondary | ICD-10-CM | POA: Insufficient documentation

## 2020-11-07 DIAGNOSIS — M1000 Idiopathic gout, unspecified site: Secondary | ICD-10-CM | POA: Insufficient documentation

## 2020-11-07 DIAGNOSIS — E1122 Type 2 diabetes mellitus with diabetic chronic kidney disease: Secondary | ICD-10-CM | POA: Diagnosis not present

## 2020-11-07 DIAGNOSIS — E1129 Type 2 diabetes mellitus with other diabetic kidney complication: Secondary | ICD-10-CM | POA: Diagnosis not present

## 2020-11-07 DIAGNOSIS — M10022 Idiopathic gout, left elbow: Secondary | ICD-10-CM | POA: Diagnosis not present

## 2020-11-07 DIAGNOSIS — I10 Essential (primary) hypertension: Secondary | ICD-10-CM | POA: Diagnosis not present

## 2020-11-07 DIAGNOSIS — N183 Chronic kidney disease, stage 3 unspecified: Secondary | ICD-10-CM | POA: Diagnosis not present

## 2020-11-07 DIAGNOSIS — E1169 Type 2 diabetes mellitus with other specified complication: Secondary | ICD-10-CM | POA: Diagnosis not present

## 2020-11-13 ENCOUNTER — Ambulatory Visit (INDEPENDENT_AMBULATORY_CARE_PROVIDER_SITE_OTHER): Payer: Medicare Other

## 2020-11-13 ENCOUNTER — Encounter: Payer: Self-pay | Admitting: Internal Medicine

## 2020-11-13 ENCOUNTER — Ambulatory Visit (INDEPENDENT_AMBULATORY_CARE_PROVIDER_SITE_OTHER): Payer: Medicare Other | Admitting: Internal Medicine

## 2020-11-13 ENCOUNTER — Other Ambulatory Visit: Payer: Self-pay

## 2020-11-13 VITALS — BP 118/80 | HR 81 | Temp 96.3°F | Resp 15 | Ht 67.0 in | Wt 188.6 lb

## 2020-11-13 DIAGNOSIS — E785 Hyperlipidemia, unspecified: Secondary | ICD-10-CM

## 2020-11-13 DIAGNOSIS — M109 Gout, unspecified: Secondary | ICD-10-CM

## 2020-11-13 DIAGNOSIS — Z7901 Long term (current) use of anticoagulants: Secondary | ICD-10-CM

## 2020-11-13 DIAGNOSIS — E1122 Type 2 diabetes mellitus with diabetic chronic kidney disease: Secondary | ICD-10-CM

## 2020-11-13 DIAGNOSIS — E1121 Type 2 diabetes mellitus with diabetic nephropathy: Secondary | ICD-10-CM

## 2020-11-13 DIAGNOSIS — R634 Abnormal weight loss: Secondary | ICD-10-CM

## 2020-11-13 DIAGNOSIS — Z1211 Encounter for screening for malignant neoplasm of colon: Secondary | ICD-10-CM | POA: Diagnosis not present

## 2020-11-13 DIAGNOSIS — R809 Proteinuria, unspecified: Secondary | ICD-10-CM

## 2020-11-13 DIAGNOSIS — E1169 Type 2 diabetes mellitus with other specified complication: Secondary | ICD-10-CM | POA: Diagnosis not present

## 2020-11-13 DIAGNOSIS — E538 Deficiency of other specified B group vitamins: Secondary | ICD-10-CM | POA: Diagnosis not present

## 2020-11-13 DIAGNOSIS — N183 Chronic kidney disease, stage 3 unspecified: Secondary | ICD-10-CM

## 2020-11-13 DIAGNOSIS — E1129 Type 2 diabetes mellitus with other diabetic kidney complication: Secondary | ICD-10-CM

## 2020-11-13 DIAGNOSIS — D689 Coagulation defect, unspecified: Secondary | ICD-10-CM

## 2020-11-13 MED ORDER — METFORMIN HCL 500 MG PO TABS: 500.0000 mg | ORAL_TABLET | Freq: Two times a day (BID) | ORAL | 2 refills | Status: DC

## 2020-11-13 MED ORDER — ALLOPURINOL 100 MG PO TABS: 100.0000 mg | ORAL_TABLET | Freq: Every day | ORAL | 6 refills | Status: AC

## 2020-11-13 NOTE — Patient Instructions (Signed)
PLEASE RETURN FOR LABS THIS WEEK OR NEXT   CHEST X RAY TODAY  FASTING SUGARS SHOULD BE < 130.  WE MAY NEED TO ADD A BEDTIME DOSE OF INSULIN

## 2020-11-13 NOTE — Progress Notes (Signed)
Subjective:  Patient ID: Stephen Kelly, male    DOB: 10-Nov-1940  Age: 80 y.o. MRN: [id number]  CC: The primary encounter diagnosis was Weight loss. Diagnoses of Colon cancer screening, Unintentional weight loss, Hyperlipidemia associated with type 2 diabetes mellitus (HCC), B12 deficiency, Anticoagulation monitoring, INR range 2-3, Gout, arthritis, CKD stage 3 due to type 2 diabetes mellitus (HCC), Clotting disorder (HCC), Type 2 diabetes mellitus with microalbuminuria, without long-term current use of insulin (HCC), and Diabetic nephropathy associated with type 2 diabetes mellitus (HCC) were also pertinent to this visit.  HPI KAIREN HALLINAN presents for follow up on CKD stage 3   Type 2 DM and AA    This visit occurred during the SARS-CoV-2 public health emergency.  Safety protocols were in place, including screening questions prior to the visit, additional usage of staff PPE, and extensive cleaning of exam room while observing appropriate contact time as indicated for disinfecting solutions.   CKD:  patient was referred to nephrology.  Patient Is angry today;  thinks he has HAD UNDIAGNOSED CANCER FOR THE LAST 5 YEARS .  PER nephrologist .    CORRECTED HIS THINKING.  HAS BEEN TAKING REDUCE DOSE OF ALLOPURINOL SINCE April 28.  He has not stopped metformin, which was advised by nephrology   Type 2 DM:  BLOOD SUGARS HAVE BEEN ELEVATED. IN THE MORNING,  NONE UNDER 130.  POST PRANDIALS AS HIGH AS 200.  Diet reviewed.  Eats twice daily .  Averages 2 starches meal.  Has given up sodas.  Breakfast at 9 .  Other meal is at 4-5 pm/  taking metformin 850 mg bid , glipizide and farxiga 10 mg .  No side effects. 16 lb wt loss since last visit.      Outpatient Medications Prior to Visit  Medication Sig Dispense Refill   atorvastatin (LIPITOR) 20 MG tablet TAKE 1 TABLET(20 MG) BY MOUTH DAILY 90 tablet 1   Cyanocobalamin ([facility]S VITAMIN B-12) 5000 MCG SUBL Place 5,000 mcg under the tongue daily.     dapagliflozin  propanediol (FARXIGA) 5 MG TABS tablet Take 1 tablet (5 mg total) by mouth daily. 30 tablet 2   hydrochlorothiazide (HYDRODIURIL) 25 MG tablet TAKE 1 TABLET(25 MG) BY MOUTH DAILY 90 tablet 1   losartan (COZAAR) 100 MG tablet TAKE 1 TABLET(100 MG) BY MOUTH DAILY 90 tablet 1   warfarin (COUMADIN) 3 MG tablet TAKE 1 TABLET(3 MG) BY MOUTH DAILY 90 tablet 1   allopurinol (ZYLOPRIM) 300 MG tablet TAKE 1 TABLET(300 MG) BY MOUTH DAILY 90 tablet 1   metFORMIN (GLUCOPHAGE) 850 MG tablet TAKE 1 TABLET(850 MG) BY MOUTH TWICE DAILY WITH A MEAL 180 tablet 1   meclizine (ANTIVERT) 25 MG tablet Take 25 mg by mouth in the morning. For dizziness (Patient not taking: No sig reported)     No facility-administered medications prior to visit.    Review of Systems;  Patient denies headache, fevers, malaise, unintentional weight loss, skin rash, eye pain, sinus congestion and sinus pain, sore throat, dysphagia,  hemoptysis , cough, dyspnea, wheezing, chest pain, palpitations, orthopnea, edema, abdominal pain, nausea, melena, diarrhea, constipation, flank pain, dysuria, hematuria, urinary  Frequency, nocturia, numbness, tingling, seizures,  Focal weakness, Loss of consciousness,  Tremor, insomnia, depression, anxiety, and suicidal ideation.      Objective:  BP 118/80 (BP Location: Left Arm, Patient Position: Sitting, Cuff Size: Normal)   Pulse 81   Temp (!) 96.3 F (35.7 C) (Temporal)   Resp 15  Ht 5\' 7"  (1.702 m)   Wt 188 lb 9.6 oz (85.5 kg)   SpO2 96%   BMI 29.54 kg/m   BP Readings from Last 3 Encounters:  11/13/20 118/80  07/15/20 140/66  05/08/20 122/78    Wt Readings from Last 3 Encounters:  11/13/20 188 lb 9.6 oz (85.5 kg)  07/15/20 204 lb 9.6 oz (92.8 kg)  05/08/20 204 lb (92.5 kg)    General appearance: alert, cooperative and appears stated age Ears: normal TM's and external ear canals both ears Throat: lips, mucosa, and tongue normal; teeth and gums normal Neck: no adenopathy, no carotid  bruit, supple, symmetrical, trachea midline and thyroid not enlarged, symmetric, no tenderness/mass/nodules Back: symmetric, no curvature. ROM normal. No CVA tenderness. Lungs: clear to auscultation bilaterally Heart: regular rate and rhythm, S1, S2 normal, no murmur, click, rub or gallop Abdomen: soft, non-tender; bowel sounds normal; no masses,  no organomegaly Pulses: 2+ and symmetric Skin: Skin color, texture, turgor normal. No rashes or lesions Lymph nodes: Cervical, supraclavicular, and axillary nodes normal.  Lab Results  Component Value Date   HGBA1C 7.5 (H) 08/14/2020   HGBA1C 8.0 (H) 04/22/2020   HGBA1C 7.4 (H) 01/18/2020    Lab Results  Component Value Date   CREATININE 1.83 (H) 08/14/2020   CREATININE 1.46 04/22/2020   CREATININE 1.57 (H) 01/18/2020    Lab Results  Component Value Date   WBC 9.0 09/27/2019   HGB 12.9 (L) 09/27/2019   HCT 37.9 (L) 09/27/2019   PLT 233.0 09/27/2019   GLUCOSE 169 (H) 08/14/2020   CHOL 108 04/22/2020   TRIG 393.0 (H) 04/22/2020   HDL 28.60 (L) 04/22/2020   LDLDIRECT 41.0 04/22/2020   LDLCALC 17 03/17/2017   ALT 12 08/14/2020   AST 13 08/14/2020   NA 136 08/14/2020   K 3.8 08/14/2020   CL 99 08/14/2020   CREATININE 1.83 (H) 08/14/2020   BUN 37 (H) 08/14/2020   CO2 27 08/14/2020   TSH 2.25 09/27/2019   PSA 1.19 03/17/2017   INR 2.2 (H) 10/31/2020   HGBA1C 7.5 (H) 08/14/2020   MICROALBUR 24.3 (H) 04/22/2020    US RENAL  Result Date: 11/04/2020 CLINICAL DATA:  Stage IIIB chronic kidney disease.  Proteinuria. EXAM: RENAL / URINARY TRACT ULTRASOUND COMPLETE COMPARISON:  None. FINDINGS: Right Kidney: Renal measurements: 10.0 x 5.2 x 4.6 cm = volume: 124 mL. Mild cortical thinning. Slightly increased echogenicity. No mass or hydronephrosis. Left Kidney: Renal measurements: 11.5 x 5.6 x 5.0 cm = volume: 168 mL. 1.3 cm upper pole cyst. No hydronephrosis. Echogenicity slightly increased. Bladder: Appears normal for degree of bladder  distention. Other: None. IMPRESSION: Kidneys within normal limits in size. Slight increased echogenicity of the cortical tissue. No hydronephrosis or significant focal finding. 1.3 cm cyst upper pole left kidney. Electronically Signed   By: Paulina FusiMark  Shogry M.D.   On: 11/04/2020 14:52    Assessment & Plan:   Problem List Items Addressed This Visit       Unprioritized   Anticoagulation monitoring, INR range 2-3   Relevant Orders   Protime-INR   B12 deficiency   Relevant Orders   Vitamin B12   CKD stage 3 due to type 2 diabetes mellitus (HCC)    GFR had been stable for the last several years,  But became lower this year and he was referred to neprhology.  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  Continue lifelong anticoagulation       Gout, arthritis    Dose of allopurinol  was reduced by nephrology In April;  Uric acid level is pending        Relevant Medications   allopurinol (ZYLOPRIM) 100 MG tablet   Other Relevant Orders   Uric acid   Hyperlipidemia associated with type 2 diabetes mellitus (HCC)    LDL is  < 70  with 20 mg atorvastatin . LFTs are normal and he is tolerating therapy.   no changes today,   Lab Results  Component Value Date   CHOL 108 04/22/2020   HDL 28.60 (L) 04/22/2020   LDLCALC 17 03/17/2017   LDLDIRECT 41.0 04/22/2020   TRIG 393.0 (H) 04/22/2020   CHOLHDL 4 04/22/2020   Lab Results  Component Value Date   ALT 12 08/14/2020   AST 13 08/14/2020   ALKPHOS 51 08/14/2020   BILITOT 0.3 08/14/2020              Relevant Medications   metFORMIN (GLUCOPHAGE) 500 MG tablet    Other Relevant Orders   Hemoglobin A1c   Uric acid   Lipid panel   Nephropathy, diabetic (HCC)    His microalbuminuria is accompanied by CKD stage 3.  Continue ARB        Relevant Medications   metFORMIN (GLUCOPHAGE) 500 MG tablet   Type 2 diabetes mellitus with microalbuminuria (HCC)    Taking Jardiance  Ad metformin since March (however, per nephrology's note, he was advised to stop metformin due to GFR ).  His morning blood sugars are elevated to 180 most days and he will likely need to add evening NPH insulin.  Will reassess after reviewing labs due next week          Relevant Medications   metFORMIN (GLUCOPHAGE) 500 MG tablet   Other Visit Diagnoses     Weight loss    -  Primary   Relevant Orders   DG Chest 2 View (Completed)   Colon cancer screening       Relevant Orders   Cologuard   Unintentional weight loss       Relevant Orders   PSA, Medicare       I have discontinued Pamelia Hoit B. Tahir "[name]"'s meclizine and allopurinol. I have also changed his metFORMIN. Additionally, I am having him start on allopurinol. Lastly, I am having him maintain his losartan, atorvastatin, CVS Vitamin B-12, dapagliflozin propanediol, warfarin, and hydrochlorothiazide.  Meds ordered this encounter  Medications   metFORMIN (GLUCOPHAGE) 500 MG tablet    Sig: Take 1 tablet (500 mg total) by mouth 2 (two) times daily with a meal. TAKE 1 TABLET(850 MG) BY MOUTH TWICE DAILY WITH A MEAL    Dispense:  180 tablet    Refill:  2    NOTE REDUCTION IN DOSE   allopurinol (ZYLOPRIM) 100 MG tablet    Sig: Take 1 tablet (100 mg total) by mouth daily.    Dispense:  30 tablet    Refill:  6    DO NOT FILL.  KEEP ON FILE.    Medications Discontinued During This Encounter  Medication Reason  metFORMIN (GLUCOPHAGE) 850 MG tablet    meclizine (ANTIVERT) 25 MG tablet    allopurinol (ZYLOPRIM) 300 MG tablet     Follow-up: Return in about 3 months (around 02/13/2021) for follow up  diabetes.   Sherlene Shams, MD

## 2020-11-16 NOTE — Assessment & Plan Note (Signed)
LDL is  < 70  with 20 mg atorvastatin . LFTs are normal and he is tolerating therapy.   no changes today,   Lab Results  Component Value Date   CHOL 108 04/22/2020   HDL 28.60 (L) 04/22/2020   LDLCALC 17 03/17/2017   LDLDIRECT 41.0 04/22/2020   TRIG 393.0 (H) 04/22/2020   CHOLHDL 4 04/22/2020   Lab Results  Component Value Date   ALT 12 08/14/2020   AST 13 08/14/2020   ALKPHOS 51 08/14/2020   BILITOT 0.3 08/14/2020

## 2020-11-16 NOTE — Assessment & Plan Note (Addendum)
GFR had been stable for the last several years,  But became lower this year and he was referred to neprhology.  Metformin was discontinued and allopurinol dose was reduced   Lab Results  Component Value Date   CREATININE 1.83 (H) 08/14/2020   Lab Results  Component Value Date   MICROALBUR 24.3 (H) 04/22/2020   MICROALBUR 15.9 (H) 09/27/2019

## 2020-11-16 NOTE — Assessment & Plan Note (Signed)
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  Continue lifelong anticoagulation

## 2020-11-16 NOTE — Assessment & Plan Note (Signed)
Dose of allopurinol  was reduced by nephrology In April;  Uric acid level is pending

## 2020-11-16 NOTE — Assessment & Plan Note (Signed)
His microalbuminuria is accompanied by CKD stage 3.  Continue ARB

## 2020-11-16 NOTE — Assessment & Plan Note (Addendum)
Taking Jardiance  Ad metformin since March (however, per nephrology's note, he was advised to stop metformin due to GFR ).  His morning blood sugars are elevated to 180 most days and he will likely need to add evening NPH insulin.  Will reassess after reviewing labs due next week

## 2020-11-19 ENCOUNTER — Other Ambulatory Visit: Payer: Self-pay

## 2020-11-19 ENCOUNTER — Other Ambulatory Visit (INDEPENDENT_AMBULATORY_CARE_PROVIDER_SITE_OTHER): Payer: Medicare Other

## 2020-11-19 DIAGNOSIS — M109 Gout, unspecified: Secondary | ICD-10-CM | POA: Diagnosis not present

## 2020-11-19 DIAGNOSIS — E1169 Type 2 diabetes mellitus with other specified complication: Secondary | ICD-10-CM | POA: Diagnosis not present

## 2020-11-19 DIAGNOSIS — R634 Abnormal weight loss: Secondary | ICD-10-CM | POA: Diagnosis not present

## 2020-11-19 DIAGNOSIS — E538 Deficiency of other specified B group vitamins: Secondary | ICD-10-CM

## 2020-11-19 DIAGNOSIS — Z7901 Long term (current) use of anticoagulants: Secondary | ICD-10-CM

## 2020-11-19 DIAGNOSIS — E785 Hyperlipidemia, unspecified: Secondary | ICD-10-CM

## 2020-11-19 LAB — PSA, MEDICARE: PSA: 2.12 ng/ml (ref 0.10–4.00)

## 2020-11-19 LAB — LIPID PANEL
Cholesterol: 102 mg/dL (ref 0–200)
HDL: 28.9 mg/dL — ABNORMAL LOW (ref >39.00)
NonHDL: 73.56
Total CHOL/HDL Ratio: 4
Triglycerides: 242 mg/dL — ABNORMAL HIGH (ref 0.0–149.0)
VLDL: 48.4 mg/dL — ABNORMAL HIGH (ref 0.0–40.0)

## 2020-11-19 LAB — PROTIME-INR
INR: 2.1 ratio — ABNORMAL HIGH (ref 0.8–1.0)
Prothrombin Time: 22.8 s — ABNORMAL HIGH (ref 9.6–13.1)

## 2020-11-19 LAB — URIC ACID: Uric Acid, Serum: 5.6 mg/dL (ref 4.0–7.8)

## 2020-11-19 LAB — VITAMIN B12: Vitamin B-12: >1550 pg/mL — ABNORMAL HIGH (ref 211–911)

## 2020-11-19 LAB — HEMOGLOBIN A1C: Hgb A1c MFr Bld: 7.5 % — ABNORMAL HIGH (ref 4.6–6.5)

## 2020-11-19 LAB — LDL CHOLESTEROL, DIRECT: Direct LDL: 42 mg/dL

## 2020-12-09 LAB — HM DIABETES EYE EXAM

## 2020-12-10 ENCOUNTER — Other Ambulatory Visit: Payer: Self-pay | Admitting: Internal Medicine

## 2020-12-17 ENCOUNTER — Ambulatory Visit (INDEPENDENT_AMBULATORY_CARE_PROVIDER_SITE_OTHER): Payer: Medicare Other | Admitting: Pharmacist

## 2020-12-17 ENCOUNTER — Telehealth: Payer: Medicare Other

## 2020-12-17 DIAGNOSIS — E785 Hyperlipidemia, unspecified: Secondary | ICD-10-CM | POA: Diagnosis not present

## 2020-12-17 DIAGNOSIS — I700 Atherosclerosis of aorta: Secondary | ICD-10-CM

## 2020-12-17 DIAGNOSIS — R809 Proteinuria, unspecified: Secondary | ICD-10-CM

## 2020-12-17 DIAGNOSIS — D689 Coagulation defect, unspecified: Secondary | ICD-10-CM

## 2020-12-17 DIAGNOSIS — E1122 Type 2 diabetes mellitus with diabetic chronic kidney disease: Secondary | ICD-10-CM

## 2020-12-17 DIAGNOSIS — N183 Chronic kidney disease, stage 3 unspecified: Secondary | ICD-10-CM | POA: Diagnosis not present

## 2020-12-17 DIAGNOSIS — E1129 Type 2 diabetes mellitus with other diabetic kidney complication: Secondary | ICD-10-CM

## 2020-12-17 DIAGNOSIS — E1169 Type 2 diabetes mellitus with other specified complication: Secondary | ICD-10-CM | POA: Diagnosis not present

## 2020-12-17 DIAGNOSIS — M109 Gout, unspecified: Secondary | ICD-10-CM

## 2020-12-17 MED ORDER — DAPAGLIFLOZIN PROPANEDIOL 10 MG PO TABS: 10.0000 mg | ORAL_TABLET | Freq: Every day | ORAL | 3 refills | Status: DC

## 2020-12-17 NOTE — Chronic Care Management (AMB) (Signed)
Chronic Care Management Pharmacy Note  12/17/2020 Name:  Stephen Kelly MRN:  [id number] DOB:  12/13/1940   Subjective: Stephen Kelly is an 80 y.o. year old male who is a primary patient of Tullo, Aris Everts, MD.  The CCM team was consulted for assistance with disease management and care coordination needs.    Engaged with patient by telephone for follow up visit in response to provider referral for pharmacy case management and/or care coordination services.   Consent to Services:  The patient was given information about Chronic Care Management services, agreed to services, and gave verbal consent prior to initiation of services.  Please see initial visit note for detailed documentation.   Patient Care Team: Crecencio Mc, MD as PCP - General (Internal Medicine) De Hollingshead, RPH-CPP (Pharmacist)  Recent office visits: 6/28 - PCP, A1c 7.5%, uric acid 5.6  Recent consult visits: 4/28 - nephrology - reduced allopurinol dose  Objective:  Lab Results  Component Value Date   CREATININE 1.83 (H) 08/14/2020   CREATININE 1.46 04/22/2020   CREATININE 1.57 (H) 01/18/2020    Lab Results  Component Value Date   HGBA1C 7.5 (H) 11/19/2020   Last diabetic Eye exam:  Lab Results  Component Value Date/Time   HMDIABEYEEXA No Retinopathy 11/23/2017 03:48 PM    Last diabetic Foot exam:  Lab Results  Component Value Date/Time   HMDIABFOOTEX NORMAL 03/12/2014 12:00 AM        Component Value Date/Time   CHOL 102 11/19/2020 0753   TRIG 242.0 (H) 11/19/2020 0753   HDL 28.90 (L) 11/19/2020 0753   CHOLHDL 4 11/19/2020 0753   VLDL 48.4 (H) 11/19/2020 0753   LDLCALC 17 03/17/2017 1106   LDLDIRECT 42.0 11/19/2020 0753    Hepatic Function Latest Ref Rng & Units 08/14/2020 04/22/2020 01/18/2020  Total Protein 6.0 - 8.3 g/dL 6.9 6.8 6.7  Albumin 3.5 - 5.2 g/dL 4.3 4.3 4.0  AST 0 - 37 U/L '13 13 12  ' ALT 0 - 53 U/L '12 13 11  ' Alk Phosphatase 39 - 117 U/L 51 44 45  Total Bilirubin 0.2  - 1.2 mg/dL 0.3 0.6 0.6    Lab Results  Component Value Date/Time   TSH 2.25 09/27/2019 10:17 AM   TSH 2.96 03/29/2019 02:27 PM    CBC Latest Ref Rng & Units 09/27/2019 03/29/2019 01/17/2019  WBC 4.0 - 10.5 K/uL 9.0 10.7(H) 11.4(H)  Hemoglobin 13.0 - 17.0 g/dL 12.9(L) 12.8(L) 13.2  Hematocrit 39.0 - 52.0 % 37.9(L) 37.9(L) 38.3(L)  Platelets 150.0 - 400.0 K/uL 233.0 277.0 283    Lab Results  Component Value Date/Time   VD25OH 31.98 03/11/2016 10:19 AM    Clinical ASCVD: Yes  The ASCVD Risk score Mikey Bussing DC Jr., et al., 2013) failed to calculate for the following reasons:   The valid total cholesterol range is 130 to 320 mg/dL     Social History   Tobacco Use  Smoking Status Never  Smokeless Tobacco Never   BP Readings from Last 3 Encounters:  11/13/20 118/80  07/15/20 140/66  05/08/20 122/78   Pulse Readings from Last 3 Encounters:  11/13/20 81  07/15/20 (!) 101  05/08/20 94   Wt Readings from Last 3 Encounters:  11/13/20 188 lb 9.6 oz (85.5 kg)  07/15/20 204 lb 9.6 oz (92.8 kg)  05/08/20 204 lb (92.5 kg)    Assessment: Review of patient past medical history, allergies, medications, health status, including review of consultants reports, laboratory and other  test data, was performed as part of comprehensive evaluation and provision of chronic care management services.   SDOH:  (Social Determinants of Health) assessments and interventions performed:  SDOH Interventions    Flowsheet Row Most Recent Value  SDOH Interventions   Financial Strain Interventions Other (Comment)  [manufacturer assistance]       CCM Care Plan  Allergies  Allergen Reactions   Azithromycin Rash    Medications Reviewed Today     Reviewed by De Hollingshead, RPH-CPP (Pharmacist) on 12/17/20 at [facility] [facility] Status: <None>   Medication Order Taking? Sig Documenting Provider Last Dose Status Informant  allopurinol (ZYLOPRIM) 100 MG tablet [id number] Yes Take 1 tablet (100 mg  total) by mouth daily. Crecencio Mc, MD Taking Active   atorvastatin (LIPITOR) 20 MG tablet [id number] Yes TAKE 1 TABLET(20 MG) BY MOUTH DAILY Crecencio Mc, MD Taking Active   Cyanocobalamin (CVS VITAMIN B-12) 5000 MCG SUBL [id number]  Place 5,000 mcg under the tongue daily. [provider]  Active   dapagliflozin propanediol (FARXIGA) 10 MG TABS tablet [id number]  Take 1 tablet (10 mg total) by mouth daily. Crecencio Mc, MD  Active   hydrochlorothiazide (HYDRODIURIL) 25 MG tablet [id number] Yes TAKE 1 TABLET(25 MG) BY MOUTH DAILY Crecencio Mc, MD Taking Active   losartan (COZAAR) 100 MG tablet [id number] Yes TAKE 1 TABLET(100 MG) BY MOUTH DAILY Crecencio Mc, MD Taking Active   metFORMIN (GLUCOPHAGE) 500 MG tablet [id number] Yes Take 1 tablet (500 mg total) by mouth 2 (two) times daily with a meal. TAKE 1 TABLET(850 MG) BY MOUTH TWICE DAILY WITH A MEAL Crecencio Mc, MD Taking Active   warfarin (COUMADIN) 3 MG tablet [id number] Yes TAKE 1 TABLET(3 MG) BY MOUTH DAILY Crecencio Mc, MD Taking Active             Patient Active Problem List   Diagnosis Date Noted   CKD stage 3 due to type 2 diabetes mellitus ([place]) 07/15/2020   Aortic atherosclerosis ([place]) 07/15/2020   B12 deficiency 04/24/2020   Acquired thrombophilia ([place]) 04/05/2020   Vertigo of central origin 03/26/2018   Cataract cortical, senile, bilateral 03/24/2018   Fatigue 03/24/2018   Type 2 diabetes mellitus with microalbuminuria ([place]) 07/02/2017   Left knee pain 03/22/2017   Erectile dysfunction 04/21/2016   Anticoagulation monitoring, INR range 2-3 09/10/2014   Encounter for preventive measure 03/13/2014   Hyperlipidemia    Polyarthritis of multiple sites 02/05/2014   Long term current use of anticoagulant therapy 12/08/2013   Hyperlipidemia associated with type 2 diabetes mellitus ([place]) 03/03/2012   Nephropathy, diabetic ([place]) 08/31/2011   Patent foramen ovale    Lupus anticoagulant inhibitor syndrome  ([place])    Hypertension 04/23/2011   Screening for colon cancer 04/22/2011   OSA (obstructive sleep apnea) 04/22/2011   Clotting disorder ([place])    History of renal calculi    Gout, arthritis    Pulmonary nodule, right     Immunization History  Administered Date(s) Administered   Influenza Split 03/02/2012   Influenza, High Dose Seasonal PF 03/12/2014, 03/11/2015, 03/11/2016, 03/17/2017, 03/24/2018   Influenza,inj,Quad PF,6+ Mos 02/20/2013   Influenza,inj,quad, With Preservative 03/17/2017   Influenza-Unspecified 04/09/2019, 03/08/2020   PFIZER(Purple Top)SARS-COV-2 Vaccination 06/01/2019, 06/22/2019, 02/23/2020   Pneumococcal Conjugate-13 09/06/2013   Pneumococcal Polysaccharide-23 04/22/2011   Pneumococcal-Unspecified 02/23/2016   Tdap 04/29/2011   Zoster Recombinat (Shingrix) 07/08/2017, 09/28/2017   Zoster, Live 03/23/2014    Conditions to be addressed/monitored: HTN, HLD,  and DMII  Care Plan : Medication Management  Updates made by De Hollingshead, RPH-CPP since 12/17/2020 12:00 AM     Problem: Diabetes, Hypertension, Hyperlipidemia      Long-Range Goal: Disease Progression Prevention   Start Date: 08/06/2020  This Visit's Progress: On track  Recent Progress: On track  Priority: High  Note:   Unable to independently afford treatment regimen Unable to achieve control of diabetes   Pharmacist Clinical Goal(s):  Over the next 90 days, patient will achieve control of diabetes as evidenced by A1c through collaboration with PharmD and provider.   Interventions: 1:1 collaboration with Crecencio Mc, MD regarding development and update of comprehensive plan of care as evidenced by provider attestation and co-signature Inter-disciplinary care team collaboration (see longitudinal plan of care) Comprehensive medication review performed; medication list updated in electronic medical record  Health Maintenance: Due for eye exam. Wonders why he needs to get this if eye  exams are always normal. Discussed principals of preventative care. Patient to schedule yearly eye exam and have results faxed to our office.   Diabetes: Uncontrolled; current treatment: metformin 500 mg BID, Farxiga 5 mg daily Current glucose readings: reports fastings 150-160s Renal function appropriate to continue current metformin dose, as eGFR is >30. Praised for elimination of soda.  Recommend to increase Farxiga to 10 mg daily. Patient will take 2 of the 5 mg tablets he currently has and I will send increased dose to the [employer] patient assistance program pharmacy. He will call [facility] when he is running low on the 5 mg dose to request a refill on the 10 mg dose.   Hypertension: Controlled per last office visit; current treatment: HCTZ 25 mg QAM, losartan 100 mg QAM Recommended to continue current regimen at this time.    Hyperlipidemia: Controlled per last lipid panel; current treatment: atorvastatin 20 mg daily   Recommended to continue current regimen at this time  Gout: Controlled per patient symptom report and uric acid; current regimen: allopurinol 100 mg daily - recently reduced by nephrology due to renal function (though there is not a maximum dose for allopurinol based on renal function once patient is titrated to goal uric acid and is tolerated) Uric acid remains at goal <6 Recommended to continue current regimen at this time  Clotting Disorder: Overdue for recheck; current regimen: warfarin 3 mg daily INR goal 2-3, managed by PCP Previously reviewed clinical history. Brachiocephalic artery thrombus requiring thrombectomy in 2011. Per chart review of [facility] tab, 07/2020 lab work by Dr. Hoy Register 07/2010 noted "No lupus anticoagulant detected. PTT-LAA results are consistent with a deficiency of one or more intrinsic pathway factors". Visit note from Dr. Alba Destine 07/2010 references a prior positive lupus inhibitor result, but references that "f/u lupus anticoagulant not  detected, ANA positive". Cardiology worked up for patent foramen ovale. 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  Given variability in lab results relating to lupus anticoagulant and advent of more recent data w/ DOAC, could consider referral back to hematology to discuss if future transition to [place] would be clinically appropriate. Would require conversation regarding cost/benefit of DOAC vs warfarin + regular testing. Can support with discussion regarding patient assistance.   Supplements: Vitamin B12 5000 mcg daily; discussed elevated B12 levels, but that no harm is anticipated given water solubility. Discussed that next time he purchases Vit B12 supplement, he can purchase a lower dose (especially  if cheaper)   Patient Goals/Self-Care Activities Over the next 90 days, patient will:  - take medications as prescribed check glucose daily, document, and provide at future appointments check blood pressure periodically, document, and provide at future appointments collaborate with provider on medication access solutions  Follow Up Plan: Telephone follow up appointment with care management team member scheduled for: ~ 8 weeks      Medication Assistance:  Wilder Glade obtained through [facility] medication assistance program.  Enrollment ends 05/24/21  Patient's preferred pharmacy is:  [id number] Lorina Rabon, [place] - [address] AT [facility] [address] [place] [address] Phone: [phone] Fax: [place], [address] [place] [address] Phone: [phone] Fax: [phone]    Follow Up:  Patient agrees to Care Plan and Follow-up.  Plan: Telephone follow up appointment with care management team member scheduled for:  ~ 8 weeks  Catie Darnelle Maffucci, PharmD, [place], [place] Clinical Pharmacist [employer] at [employer] [phone]

## 2020-12-17 NOTE — Patient Instructions (Signed)
Stephen Kelly,   It was great talking to you today!  INCREASE Farxiga to 10 mg daily. You can take 2 of the 5 mg tablets together every morning. When you have about 2 weeks left of that supply (so when you open your last bottle of Farxiga 5 mg), call [employer] patient assistance at [phone] and ask for a refill on the Farxiga 10 mg daily.   Take care! I am so proud of you cutting out soda!  Catie Feliz Beam, PharmD [phone]   Visit Information  PATIENT GOALS:  Goals Addressed               This Visit's Progress     Patient Stated     Medication Monitoring (pt-stated)        Patient Goals/Self-Care Activities Over the next 90 days, patient will:  - take medications as prescribed check glucose daily, document, and provide at future appointments check blood pressure periodically, document, and provide at future appointments collaborate with provider on medication access solutions        Patient verbalizes understanding of instructions provided today and agrees to view in MyChart.   Plan: Telephone follow up appointment with care management team member scheduled for:  ~ 8 weeks  Catie Feliz Beam, PharmD, [place], CPP Clinical Pharmacist [employer] at [employer] [phone]

## 2020-12-18 ENCOUNTER — Other Ambulatory Visit: Payer: Self-pay

## 2020-12-18 MED ORDER — LOSARTAN POTASSIUM 100 MG PO TABS: ORAL_TABLET | ORAL | 1 refills | Status: AC

## 2021-01-02 DIAGNOSIS — I10 Essential (primary) hypertension: Secondary | ICD-10-CM | POA: Diagnosis not present

## 2021-01-02 DIAGNOSIS — E1122 Type 2 diabetes mellitus with diabetic chronic kidney disease: Secondary | ICD-10-CM | POA: Diagnosis not present

## 2021-01-02 DIAGNOSIS — N1832 Chronic kidney disease, stage 3b: Secondary | ICD-10-CM | POA: Diagnosis not present

## 2021-01-02 DIAGNOSIS — R809 Proteinuria, unspecified: Secondary | ICD-10-CM | POA: Diagnosis not present

## 2021-01-09 DIAGNOSIS — N1832 Chronic kidney disease, stage 3b: Secondary | ICD-10-CM | POA: Diagnosis not present

## 2021-01-09 DIAGNOSIS — R809 Proteinuria, unspecified: Secondary | ICD-10-CM | POA: Diagnosis not present

## 2021-01-09 DIAGNOSIS — Z87442 Personal history of urinary calculi: Secondary | ICD-10-CM | POA: Diagnosis not present

## 2021-01-09 DIAGNOSIS — M1000 Idiopathic gout, unspecified site: Secondary | ICD-10-CM | POA: Diagnosis not present

## 2021-01-24 ENCOUNTER — Telehealth: Payer: Self-pay | Admitting: Pharmacist

## 2021-01-24 ENCOUNTER — Telehealth: Payer: Medicare Other

## 2021-01-24 NOTE — Telephone Encounter (Signed)
  Chronic Care Management   Note  01/24/2021 Name: Stephen Kelly MRN: [id number] DOB: 11-Dec-1940   Attempted to contact patient for scheduled appointment for medication management support. Left HIPAA compliant message for patient to return my call at their convenience.    Plan: - If I do not hear back from the patient by end of business today, will collaborate with Care Guide to outreach to schedule follow up with me  Catie Feliz Beam, PharmD, [place], CPP Clinical Pharmacist [facility] at [employer] [phone]

## 2021-01-31 NOTE — Telephone Encounter (Signed)
Patient has been rescheduled.

## 2021-02-03 ENCOUNTER — Ambulatory Visit (INDEPENDENT_AMBULATORY_CARE_PROVIDER_SITE_OTHER): Payer: Medicare Other | Admitting: Pharmacist

## 2021-02-03 DIAGNOSIS — R809 Proteinuria, unspecified: Secondary | ICD-10-CM

## 2021-02-03 DIAGNOSIS — E1122 Type 2 diabetes mellitus with diabetic chronic kidney disease: Secondary | ICD-10-CM

## 2021-02-03 DIAGNOSIS — E1129 Type 2 diabetes mellitus with other diabetic kidney complication: Secondary | ICD-10-CM

## 2021-02-03 DIAGNOSIS — E1169 Type 2 diabetes mellitus with other specified complication: Secondary | ICD-10-CM

## 2021-02-03 DIAGNOSIS — E785 Hyperlipidemia, unspecified: Secondary | ICD-10-CM

## 2021-02-03 NOTE — Patient Instructions (Signed)
Visit Information  PATIENT GOALS:  Goals Addressed               This Visit's Progress     Patient Stated     Medication Monitoring (pt-stated)        Patient Goals/Self-Care Activities Over the next 90 days, patient will:  - take medications as prescribed check glucose daily, document, and provide at future appointments check blood pressure periodically, document, and provide at future appointments collaborate with provider on medication access solutions        Patient verbalizes understanding of instructions provided today and agrees to view in MyChart.    Plan: Telephone follow up appointment with care management team member scheduled for:  ~ 6 weeks  Catie Jodee Wagenaar, PharmD, BCACP, CPP Clinical Pharmacist [facility] at [place] [phone]   

## 2021-02-03 NOTE — Chronic Care Management (AMB) (Signed)
Chronic Care Management Pharmacy Note  02/03/2021 Name:  Stephen Kelly MRN:  [id number] DOB:  07-14-1940  Subjective: Stephen Kelly is an 80 y.o. year old male who is a primary patient of Tullo, Aris Everts, MD.  The CCM team was consulted for assistance with disease management and care coordination needs.    Engaged with patient by telephone for  medication access  in response to provider referral for pharmacy case management and/or care coordination services.   Consent to Services:  The patient was given information about Chronic Care Management services, agreed to services, and gave verbal consent prior to initiation of services.  Please see initial visit note for detailed documentation.   Patient Care Team: Crecencio Mc, MD as PCP - General (Internal Medicine) De Hollingshead, RPH-CPP (Pharmacist)   Objective:  Lab Results  Component Value Date   CREATININE 1.83 (H) 08/14/2020   CREATININE 1.46 04/22/2020   CREATININE 1.57 (H) 01/18/2020    Lab Results  Component Value Date   HGBA1C 7.5 (H) 11/19/2020   Last diabetic Eye exam:  Lab Results  Component Value Date/Time   HMDIABEYEEXA No Retinopathy 11/23/2017 03:48 PM    Last diabetic Foot exam:  Lab Results  Component Value Date/Time   HMDIABFOOTEX NORMAL 03/12/2014 12:00 AM        Component Value Date/Time   CHOL 102 11/19/2020 0753   TRIG 242.0 (H) 11/19/2020 0753   HDL 28.90 (L) 11/19/2020 0753   CHOLHDL 4 11/19/2020 0753   VLDL 48.4 (H) 11/19/2020 0753   LDLCALC 17 03/17/2017 1106   LDLDIRECT 42.0 11/19/2020 0753    Hepatic Function Latest Ref Rng & Units 08/14/2020 04/22/2020 01/18/2020  Total Protein 6.0 - 8.3 g/dL 6.9 6.8 6.7  Albumin 3.5 - 5.2 g/dL 4.3 4.3 4.0  AST 0 - 37 U/L _0 ALT 0 - 53 U/L _1 Alk Phosphatase 39 - 117 U/L 51 44 45  Total Bilirubin 0.2 - 1.2 mg/dL 0.3 0.6 0.6    Lab Results  Component Value Date/Time   TSH 2.25 09/27/2019 10:17 AM   TSH 2.96 03/29/2019  02:27 PM    CBC Latest Ref Rng & Units 09/27/2019 03/29/2019 01/17/2019  WBC 4.0 - 10.5 K/uL 9.0 10.7(H) 11.4(H)  Hemoglobin 13.0 - 17.0 g/dL 12.9(L) 12.8(L) 13.2  Hematocrit 39.0 - 52.0 % 37.9(L) 37.9(L) 38.3(L)  Platelets 150.0 - 400.0 K/uL 233.0 277.0 283    Lab Results  Component Value Date/Time   VD25OH 31.98 03/11/2016 10:19 AM    Clinical ASCVD: No  The ASCVD Risk score (Arnett DK, et al., 2019) failed to calculate for the following reasons:   The valid total cholesterol range is 130 to 320 mg/dL     Social History   Tobacco Use  Smoking Status Never  Smokeless Tobacco Never   BP Readings from Last 3 Encounters:  11/13/20 118/80  07/15/20 140/66  05/08/20 122/78   Pulse Readings from Last 3 Encounters:  11/13/20 81  07/15/20 (!) 101  05/08/20 94   Wt Readings from Last 3 Encounters:  11/13/20 188 lb 9.6 oz (85.5 kg)  07/15/20 204 lb 9.6 oz (92.8 kg)  05/08/20 204 lb (92.5 kg)    Assessment: Review of patient past medical history, allergies, medications, health status, including review of consultants reports, laboratory and other test data, was performed as part of comprehensive evaluation and provision of chronic care management services.   SDOH:  (Social Determinants of  Health) assessments and interventions performed:  SDOH Interventions    Flowsheet Row Most Recent Value  SDOH Interventions   Financial Strain Interventions Other (Comment)  [manufacturer assistance]       CCM Care Plan  Allergies  Allergen Reactions   Azithromycin Rash    Medications Reviewed Today     Reviewed by De Hollingshead, RPH-CPP (Pharmacist) on 12/17/20 at [place] List Status: <None>   Medication Order Taking? Sig Documenting Provider Last Dose Status Informant  allopurinol (ZYLOPRIM) 100 MG tablet [id number] Yes Take 1 tablet (100 mg total) by mouth daily. Crecencio Mc, MD Taking Active   atorvastatin (LIPITOR) 20 MG tablet [id number] Yes TAKE 1 TABLET(20 MG) BY  MOUTH DAILY Crecencio Mc, MD Taking Active   Cyanocobalamin ([employer] VITAMIN B-12) 5000 MCG SUBL [id number]  Place 5,000 mcg under the tongue daily. [provider]  Active   dapagliflozin propanediol (FARXIGA) 10 MG TABS tablet [id number]  Take 1 tablet (10 mg total) by mouth daily. Crecencio Mc, MD  Active   hydrochlorothiazide (HYDRODIURIL) 25 MG tablet [id number] Yes TAKE 1 TABLET(25 MG) BY MOUTH DAILY Crecencio Mc, MD Taking Active   losartan (COZAAR) 100 MG tablet [id number] Yes TAKE 1 TABLET(100 MG) BY MOUTH DAILY Crecencio Mc, MD Taking Active   metFORMIN (GLUCOPHAGE) 500 MG tablet [id number] Yes Take 1 tablet (500 mg total) by mouth 2 (two) times daily with a meal. TAKE 1 TABLET(850 MG) BY MOUTH TWICE DAILY WITH A MEAL Crecencio Mc, MD Taking Active   warfarin (COUMADIN) 3 MG tablet [id number] Yes TAKE 1 TABLET(3 MG) BY MOUTH DAILY Crecencio Mc, MD Taking Active             Patient Active Problem List   Diagnosis Date Noted   CKD stage 3 due to type 2 diabetes mellitus ([place]) 07/15/2020   Aortic atherosclerosis ([place]) 07/15/2020   B12 deficiency 04/24/2020   Acquired thrombophilia ([place]) 04/05/2020   Vertigo of central origin 03/26/2018   Cataract cortical, senile, bilateral 03/24/2018   Fatigue 03/24/2018   Type 2 diabetes mellitus with microalbuminuria ([place]) 07/02/2017   Left knee pain 03/22/2017   Erectile dysfunction 04/21/2016   Anticoagulation monitoring, INR range 2-3 09/10/2014   Encounter for preventive measure 03/13/2014   Hyperlipidemia    Polyarthritis of multiple sites 02/05/2014   Long term current use of anticoagulant therapy 12/08/2013   Hyperlipidemia associated with type 2 diabetes mellitus ([place]) 03/03/2012   Nephropathy, diabetic ([place]) 08/31/2011   Patent foramen ovale    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[place])    Hypertension 04/23/2011   Screening for colon cancer 04/22/2011   OSA (obstructive sleep apnea) 04/22/2011   Clotting  disorder ([place])    History of renal calculi    Gout, arthritis    Pulmonary nodule, right     Immunization History  Administered Date(s) Administered   Influenza Split 03/02/2012   Influenza, High Dose Seasonal PF 03/12/2014, 03/11/2015, 03/11/2016, 03/17/2017, 03/24/2018   Influenza,inj,Quad PF,6+ Mos 02/20/2013   Influenza,inj,quad, With Preservative 03/17/2017   Influenza-Unspecified 04/09/2019, 03/08/2020   PFIZER(Purple Top)SARS-COV-2 Vaccination 06/01/2019, 06/22/2019, 02/23/2020   Pneumococcal Conjugate-13 09/06/2013   Pneumococcal Polysaccharide-23 04/22/2011   Pneumococcal-Unspecified 02/23/2016   Tdap 04/29/2011   Zoster Recombinat (Shingrix) 07/08/2017, 09/28/2017   Zoster, Live 03/23/2014    Conditions to be addressed/monitored: HTN, HLD, and DMII  Care Plan : Medication Management  Updates made by De Hollingshead, RPH-CPP since 02/03/2021 12:00 AM  Problem: Diabetes, Hypertension, Hyperlipidemia      Long-Range Goal: Disease Progression Prevention   Start Date: 08/06/2020  This Visit's Progress: On track  Recent Progress: On track  Priority: High  Note:   Unable to independently afford treatment regimen Unable to achieve control of diabetes   Pharmacist Clinical Goal(s):  Over the next 90 days, patient will achieve control of diabetes as evidenced by A1c through collaboration with PharmD and provider.   Interventions: 1:1 collaboration with Crecencio Mc, MD regarding development and update of comprehensive plan of care as evidenced by provider attestation and co-signature Inter-disciplinary care team collaboration (see longitudinal plan of care) Comprehensive medication review performed; medication list updated in electronic medical record  Health Maintenance Yearly diabetic eye exam: due Yearly diabetic foot exam: up to date Yearly influenza vaccination: due - encourage yearly influenza vaccine Td/Tdap vaccination: due Pneumonia vaccination:  up to date COVID vaccinations: due - recommend bivalent booster Shingrix vaccinations: up to date Colonoscopy: up to date  Diabetes: Uncontrolled; current treatment: metformin 500 mg BID, Farxiga 10 mg daily Renal function appropriate to continue current metformin dose, as eGFR is >30. Calls today to report that [place] order for 5 mg dose was received this weekend. He plans to continue taking two 5 mg tablets daily until 10 mg dose arrives.  [place] patient assistance to follow up on order for 10 mg dose that was previously sent in. Waited on hold for 25 minutes before having to hang up. Will collaborate w/ CPhT to outreach to follow up on 10 mg order.   Hypertension: Controlled per last office visit; current treatment: HCTZ 25 mg QAM, losartan 100 mg QAM Previously recommended to continue current regimen at this time.    Hyperlipidemia: Controlled per last lipid panel; current treatment: atorvastatin 20 mg daily   Previously recommended to continue current regimen at this time  Gout: Controlled per patient symptom report and uric acid; current regimen: allopurinol 100 mg daily - recently reduced by nephrology due to renal function (though there is not a maximum dose for allopurinol based on renal function once patient is titrated to goal uric acid and is tolerated) Uric acid remains at goal <6 Previously recommended to continue current regimen at this time  Clotting Disorder: Overdue for recheck; current regimen: warfarin 3 mg daily INR goal 2-3, managed by PCP Previously reviewed clinical history. Brachiocephalic artery thrombus requiring thrombectomy in 2011. Per chart review of [facility] tab, 07/2020 lab work by Dr. Hoy Register 07/2010 noted "No lupus anticoagulant detected. PTT-LAA results are consistent with a deficiency of one or more intrinsic pathway factors". Visit note from Dr. Alba Destine 07/2010 references a prior positive lupus inhibitor result, but references that "f/u lupus  anticoagulant not detected, ANA positive". Cardiology worked up for patent foramen ovale. 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  Given variability in lab results relating to lupus anticoagulant and advent of more recent data w/ DOAC, could consider referral back to hematology to discuss if future transition to [place] would be clinically appropriate. Would require conversation regarding cost/benefit of DOAC vs warfarin + regular testing. Can support with discussion regarding patient assistance.   Supplements: Vitamin B12 5000 mcg daily;    Patient Goals/Self-Care Activities Over the next 90 days, patient will:  - take medications as prescribed check glucose daily, document, and provide at future appointments check blood pressure periodically, document, and provide at future  appointments collaborate with provider on medication access solutions  Follow Up Plan: Telephone follow up appointment with care management team member scheduled for: ~ 6 weeks      Medication Assistance:  Wilder Glade obtained through [facility] medication assistance program.  Enrollment ends 05/24/21  Patient's preferred pharmacy is:  [id number] Lorina Rabon, [place] [address] AT [facility] [address] [place] [address] Phone: [phone] Fax: [place], [place]. [place] [address] Phone: [phone] Fax: [phone]    Follow Up:  Patient agrees to Care Plan and Follow-up.  Plan: Telephone follow up appointment with care management team member scheduled for:  ~6 weeks  Catie Darnelle Maffucci, PharmD, [place], [employer] Clinical Pharmacist [employer] at [employer] [phone]

## 2021-02-04 ENCOUNTER — Ambulatory Visit: Payer: Medicare Other | Admitting: Pharmacist

## 2021-02-04 DIAGNOSIS — I10 Essential (primary) hypertension: Secondary | ICD-10-CM

## 2021-02-04 DIAGNOSIS — E1129 Type 2 diabetes mellitus with other diabetic kidney complication: Secondary | ICD-10-CM

## 2021-02-04 DIAGNOSIS — E1169 Type 2 diabetes mellitus with other specified complication: Secondary | ICD-10-CM

## 2021-02-04 MED ORDER — DAPAGLIFLOZIN PROPANEDIOL 10 MG PO TABS: 10.0000 mg | ORAL_TABLET | Freq: Every day | ORAL | 3 refills | Status: DC

## 2021-02-04 NOTE — Chronic Care Management (AMB) (Signed)
Chronic Care Management Pharmacy Note  02/04/2021 Name:  Stephen Kelly MRN:  [id number] DOB:  1940-09-22   Subjective: Stephen Kelly is an 80 y.o. year old male who is a primary patient of Tullo, Aris Everts, MD.  The CCM team was consulted for assistance with disease management and care coordination needs.    Care coordination  for  medication access  in response to provider referral for pharmacy case management and/or care coordination services.   Consent to Services:  The patient was given information about Chronic Care Management services, agreed to services, and gave verbal consent prior to initiation of services.  Please see initial visit note for detailed documentation.   Patient Care Team: Crecencio Mc, MD as PCP - General (Internal Medicine) De Hollingshead, RPH-CPP (Pharmacist)   Objective:  Lab Results  Component Value Date   CREATININE 1.83 (H) 08/14/2020   CREATININE 1.46 04/22/2020   CREATININE 1.57 (H) 01/18/2020    Lab Results  Component Value Date   HGBA1C 7.5 (H) 11/19/2020   Last diabetic Eye exam:  Lab Results  Component Value Date/Time   HMDIABEYEEXA No Retinopathy 11/23/2017 03:48 PM    Last diabetic Foot exam:  Lab Results  Component Value Date/Time   HMDIABFOOTEX NORMAL 03/12/2014 12:00 AM        Component Value Date/Time   CHOL 102 11/19/2020 0753   TRIG 242.0 (H) 11/19/2020 0753   HDL 28.90 (L) 11/19/2020 0753   CHOLHDL 4 11/19/2020 0753   VLDL 48.4 (H) 11/19/2020 0753   LDLCALC 17 03/17/2017 1106   LDLDIRECT 42.0 11/19/2020 0753    Hepatic Function Latest Ref Rng & Units 08/14/2020 04/22/2020 01/18/2020  Total Protein 6.0 - 8.3 g/dL 6.9 6.8 6.7  Albumin 3.5 - 5.2 g/dL 4.3 4.3 4.0  AST 0 - 37 U/L _0 ALT 0 - 53 U/L _1 Alk Phosphatase 39 - 117 U/L 51 44 45  Total Bilirubin 0.2 - 1.2 mg/dL 0.3 0.6 0.6    Lab Results  Component Value Date/Time   TSH 2.25 09/27/2019 10:17 AM   TSH 2.96 03/29/2019 02:27 PM     CBC Latest Ref Rng & Units 09/27/2019 03/29/2019 01/17/2019  WBC 4.0 - 10.5 K/uL 9.0 10.7(H) 11.4(H)  Hemoglobin 13.0 - 17.0 g/dL 12.9(L) 12.8(L) 13.2  Hematocrit 39.0 - 52.0 % 37.9(L) 37.9(L) 38.3(L)  Platelets 150.0 - 400.0 K/uL 233.0 277.0 283    Lab Results  Component Value Date/Time   VD25OH 31.98 03/11/2016 10:19 AM    Clinical ASCVD: No  The ASCVD Risk score (Arnett DK, et al., 2019) failed to calculate for the following reasons:   The valid total cholesterol range is 130 to 320 mg/dL      Social History   Tobacco Use  Smoking Status Never  Smokeless Tobacco Never   BP Readings from Last 3 Encounters:  11/13/20 118/80  07/15/20 140/66  05/08/20 122/78   Pulse Readings from Last 3 Encounters:  11/13/20 81  07/15/20 (!) 101  05/08/20 94   Wt Readings from Last 3 Encounters:  11/13/20 188 lb 9.6 oz (85.5 kg)  07/15/20 204 lb 9.6 oz (92.8 kg)  05/08/20 204 lb (92.5 kg)    Assessment: Review of patient past medical history, allergies, medications, health status, including review of consultants reports, laboratory and other test data, was performed as part of comprehensive evaluation and provision of chronic care management services.   SDOH:  (Social Determinants of  Health) assessments and interventions performed:  SDOH Interventions    Flowsheet Row Most Recent Value  SDOH Interventions   Financial Strain Interventions Other (Comment)  [manufacturer assistance]       CCM Care Plan  Allergies  Allergen Reactions   Azithromycin Rash    Medications Reviewed Today     Reviewed by De Hollingshead, RPH-CPP (Pharmacist) on 12/17/20 at [place] List Status: <None>   Medication Order Taking? Sig Documenting Provider Last Dose Status Informant  allopurinol (ZYLOPRIM) 100 MG tablet [id number] Yes Take 1 tablet (100 mg total) by mouth daily. Crecencio Mc, MD Taking Active   atorvastatin (LIPITOR) 20 MG tablet [id number] Yes TAKE 1 TABLET(20 MG) BY MOUTH  DAILY Crecencio Mc, MD Taking Active   Cyanocobalamin ([employer]S VITAMIN B-12) 5000 MCG SUBL [id number]  Place 5,000 mcg under the tongue daily. [provider]  Active   dapagliflozin propanediol (FARXIGA) 10 MG TABS tablet [id number]  Take 1 tablet (10 mg total) by mouth daily. Crecencio Mc, MD  Active   hydrochlorothiazide (HYDRODIURIL) 25 MG tablet [id number] Yes TAKE 1 TABLET(25 MG) BY MOUTH DAILY Crecencio Mc, MD Taking Active   losartan (COZAAR) 100 MG tablet [id number] Yes TAKE 1 TABLET(100 MG) BY MOUTH DAILY Crecencio Mc, MD Taking Active   metFORMIN (GLUCOPHAGE) 500 MG tablet [id number] Yes Take 1 tablet (500 mg total) by mouth 2 (two) times daily with a meal. TAKE 1 TABLET(850 MG) BY MOUTH TWICE DAILY WITH A MEAL Crecencio Mc, MD Taking Active   warfarin (COUMADIN) 3 MG tablet [id number] Yes TAKE 1 TABLET(3 MG) BY MOUTH DAILY Crecencio Mc, MD Taking Active             Patient Active Problem List   Diagnosis Date Noted   CKD stage 3 due to type 2 diabetes mellitus ([place]) 07/15/2020   Aortic atherosclerosis ([place]) 07/15/2020   B12 deficiency 04/24/2020   Acquired thrombophilia ([place]) 04/05/2020   Vertigo of central origin 03/26/2018   Cataract cortical, senile, bilateral 03/24/2018   Fatigue 03/24/2018   Type 2 diabetes mellitus with microalbuminuria ([place]) 07/02/2017   Left knee pain 03/22/2017   Erectile dysfunction 04/21/2016   Anticoagulation monitoring, INR range 2-3 09/10/2014   Encounter for preventive measure 03/13/2014   Hyperlipidemia    Polyarthritis of multiple sites 02/05/2014   Long term current use of anticoagulant therapy 12/08/2013   Hyperlipidemia associated with type 2 diabetes mellitus ([place]) 03/03/2012   Nephropathy, diabetic ([place]) 08/31/2011   Patent foramen ovale    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[place])    Hypertension 04/23/2011   Screening for colon cancer 04/22/2011   OSA (obstructive sleep apnea) 04/22/2011   Clotting disorder  ([place])    History of renal calculi    Gout, arthritis    Pulmonary nodule, right     Immunization History  Administered Date(s) Administered   Influenza Split 03/02/2012   Influenza, High Dose Seasonal PF 03/12/2014, 03/11/2015, 03/11/2016, 03/17/2017, 03/24/2018   Influenza,inj,Quad PF,6+ Mos 02/20/2013   Influenza,inj,quad, With Preservative 03/17/2017   Influenza-Unspecified 04/09/2019, 03/08/2020   PFIZER(Purple Top)SARS-COV-2 Vaccination 06/01/2019, 06/22/2019, 02/23/2020   Pneumococcal Conjugate-13 09/06/2013   Pneumococcal Polysaccharide-23 04/22/2011   Pneumococcal-Unspecified 02/23/2016   Tdap 04/29/2011   Zoster Recombinat (Shingrix) 07/08/2017, 09/28/2017   Zoster, Live 03/23/2014    Conditions to be addressed/monitored: HTN, HLD, and DMII  Care Plan : Medication Management  Updates made by De Hollingshead, RPH-CPP since 02/04/2021 12:00 AM  Problem: Diabetes, Hypertension, Hyperlipidemia      Long-Range Goal: Disease Progression Prevention   Start Date: 08/06/2020  This Visit's Progress: On track  Recent Progress: On track  Priority: High  Note:   Unable to independently afford treatment regimen Unable to achieve control of diabetes   Pharmacist Clinical Goal(s):  Over the next 90 days, patient will achieve control of diabetes as evidenced by A1c through collaboration with PharmD and provider.   Interventions: 1:1 collaboration with Crecencio Mc, MD regarding development and update of comprehensive plan of care as evidenced by provider attestation and co-signature Inter-disciplinary care team collaboration (see longitudinal plan of care) Comprehensive medication review performed; medication list updated in electronic medical record  Health Maintenance Yearly diabetic eye exam: due Yearly diabetic foot exam: up to date Yearly influenza vaccination: due - encourage yearly influenza vaccine Td/Tdap vaccination: due Pneumonia vaccination: up to  date COVID vaccinations: due - recommend bivalent booster Shingrix vaccinations: up to date Colonoscopy: up to date  Diabetes: Uncontrolled; current treatment: metformin 500 mg BID, Farxiga 10 mg daily Renal function appropriate to continue current metformin dose, as eGFR is >30. Per CPhT, [place] recommended that a [place] script be physically faxed to [facility] and Me program (they recently changed systems and e-script may not arrive)- [phone]. She informed turn around times for processing faxes is 72 hours. She advises to check back in 72 hours for an update on prescription processing and shipping.   Hypertension: Controlled per last office visit; current treatment: HCTZ 25 mg QAM, losartan 100 mg QAM Previously recommended to continue current regimen at this time.    Hyperlipidemia: Controlled per last lipid panel; current treatment: atorvastatin 20 mg daily   Previously recommended to continue current regimen at this time  Gout: Controlled per patient symptom report and uric acid; current regimen: allopurinol 100 mg daily - recently reduced by nephrology due to renal function (though there is not a maximum dose for allopurinol based on renal function once patient is titrated to goal uric acid and is tolerated) Uric acid remains at goal <6 Previously recommended to continue current regimen at this time  Clotting Disorder: Overdue for recheck; current regimen: warfarin 3 mg daily INR goal 2-3, managed by PCP Previously reviewed clinical history. Brachiocephalic artery thrombus requiring thrombectomy in 2011. Per chart review of Media tab, 07/2020 lab work by Dr. Hoy Register 07/2010 noted "No lupus anticoagulant detected. PTT-LAA results are consistent with a deficiency of one or more intrinsic pathway factors". Visit note from Dr. Alba Destine 07/2010 references a prior positive lupus inhibitor result, but references that "f/u lupus anticoagulant not detected, ANA positive". Cardiology worked up for patent  foramen ovale. 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  Given variability in lab results relating to lupus anticoagulant and advent of more recent data w/ DOAC, could consider referral back to hematology to discuss if future transition to [place] would be clinically appropriate. Would require conversation regarding cost/benefit of DOAC vs warfarin + regular testing. Can support with discussion regarding patient assistance.   Supplements: Vitamin B12 5000 mcg daily;    Patient Goals/Self-Care Activities Over the next 90 days, patient will:  - take medications as prescribed check glucose daily, document, and provide at future appointments check blood pressure periodically, document, and provide at future appointments collaborate with provider on medication access solutions  Follow Up Plan: Telephone follow up appointment with care management team member  scheduled for: ~ 6 weeks      Medication Assistance:  Wilder Glade obtained through [employer]  medication assistance program.  Enrollment ends 05/24/21  Patient's preferred pharmacy is:  [facility] DRUG STORE [place], [place] - [place] AT [facility] [place] [address] Phone: [phone] Fax: [place], [address] [place] [address] Phone: [phone] Fax: [phone]   Follow Up:  Patient agrees to Care Plan and Follow-up.  Plan: Telephone follow up appointment with care management team member scheduled for:  ~ 6 weeks  Catie Darnelle Maffucci, PharmD, [place], [place] Clinical Pharmacist [employer] at [employer] [phone]

## 2021-02-04 NOTE — Patient Instructions (Addendum)
Visit Information  PATIENT GOALS:  Goals Addressed               This Visit's Progress     Patient Stated     Medication Monitoring (pt-stated)        Patient Goals/Self-Care Activities Over the next 90 days, patient will:  - take medications as prescribed check glucose daily, document, and provide at future appointments check blood pressure periodically, document, and provide at future appointments collaborate with provider on medication access solutions        Patient verbalizes understanding of instructions provided today and agrees to view in MyChart.    Plan: Telephone follow up appointment with care management team member scheduled for:  ~ 6 weeks  Catie Feliz Beam, PharmD, [place], CPP Clinical Pharmacist [employer] at [employer] [phone]

## 2021-02-11 ENCOUNTER — Ambulatory Visit: Payer: Medicare Other | Admitting: Pharmacist

## 2021-02-11 DIAGNOSIS — I10 Essential (primary) hypertension: Secondary | ICD-10-CM

## 2021-02-11 DIAGNOSIS — E1129 Type 2 diabetes mellitus with other diabetic kidney complication: Secondary | ICD-10-CM

## 2021-02-11 DIAGNOSIS — E1122 Type 2 diabetes mellitus with diabetic chronic kidney disease: Secondary | ICD-10-CM

## 2021-02-11 DIAGNOSIS — R809 Proteinuria, unspecified: Secondary | ICD-10-CM

## 2021-02-11 DIAGNOSIS — E785 Hyperlipidemia, unspecified: Secondary | ICD-10-CM

## 2021-02-11 DIAGNOSIS — E1169 Type 2 diabetes mellitus with other specified complication: Secondary | ICD-10-CM

## 2021-02-11 NOTE — Chronic Care Management (AMB) (Signed)
Chronic Care Management Pharmacy Note  02/11/2021 Name:  Stephen Kelly MRN:  [id number] DOB:  [birth year]   Subjective: Stephen Kelly is an 80 y.o. year old male who is a primary patient of Tullo, Aris Everts, MD.  The CCM team was consulted for assistance with disease management and care coordination needs.    Care coordination  for  medication access  in response to provider referral for pharmacy case management and/or care coordination services.   Consent to Services:  The patient was given information about Chronic Care Management services, agreed to services, and gave verbal consent prior to initiation of services.  Please see initial visit note for detailed documentation.   Patient Care Team: Crecencio Mc, MD as PCP - General (Internal Medicine) De Hollingshead, RPH-CPP (Pharmacist)   Objective:  Lab Results  Component Value Date   CREATININE 1.83 (H) 08/14/2020   CREATININE 1.46 04/22/2020   CREATININE 1.57 (H) 01/18/2020    Lab Results  Component Value Date   HGBA1C 7.5 (H) 11/19/2020   Last diabetic Eye exam:  Lab Results  Component Value Date/Time   HMDIABEYEEXA No Retinopathy 11/23/2017 03:48 PM    Last diabetic Foot exam:  Lab Results  Component Value Date/Time   HMDIABFOOTEX NORMAL 03/12/2014 12:00 AM        Component Value Date/Time   CHOL 102 11/19/2020 0753   TRIG 242.0 (H) 11/19/2020 0753   HDL 28.90 (L) 11/19/2020 0753   CHOLHDL 4 11/19/2020 0753   VLDL 48.4 (H) 11/19/2020 0753   LDLCALC 17 03/17/2017 1106   LDLDIRECT 42.0 11/19/2020 0753    Hepatic Function Latest Ref Rng & Units 08/14/2020 04/22/2020 01/18/2020  Total Protein 6.0 - 8.3 g/dL 6.9 6.8 6.7  Albumin 3.5 - 5.2 g/dL 4.3 4.3 4.0  AST 0 - 37 U/L '13 13 12  ' ALT 0 - 53 U/L '12 13 11  ' Alk Phosphatase 39 - 117 U/L 51 44 45  Total Bilirubin 0.2 - 1.2 mg/dL 0.3 0.6 0.6    Lab Results  Component Value Date/Time   TSH 2.25 09/27/2019 10:17 AM   TSH 2.96 03/29/2019 02:27 PM     CBC Latest Ref Rng & Units 09/27/2019 03/29/2019 01/17/2019  WBC 4.0 - 10.5 K/uL 9.0 10.7(H) 11.4(H)  Hemoglobin 13.0 - 17.0 g/dL 12.9(L) 12.8(L) 13.2  Hematocrit 39.0 - 52.0 % 37.9(L) 37.9(L) 38.3(L)  Platelets 150.0 - 400.0 K/uL 233.0 277.0 283    Lab Results  Component Value Date/Time   VD25OH 31.98 03/11/2016 10:19 AM    Social History   Tobacco Use  Smoking Status Never  Smokeless Tobacco Never   BP Readings from Last 3 Encounters:  11/13/20 118/80  07/15/20 140/66  05/08/20 122/78   Pulse Readings from Last 3 Encounters:  11/13/20 81  07/15/20 (!) 101  05/08/20 94   Wt Readings from Last 3 Encounters:  11/13/20 188 lb 9.6 oz (85.5 kg)  07/15/20 204 lb 9.6 oz (92.8 kg)  05/08/20 204 lb (92.5 kg)    Assessment: Review of patient past medical history, allergies, medications, health status, including review of consultants reports, laboratory and other test data, was performed as part of comprehensive evaluation and provision of chronic care management services.   SDOH:  (Social Determinants of Health) assessments and interventions performed:    CCM Care Plan  Allergies  Allergen Reactions   Azithromycin Rash    Medications Reviewed Today     Reviewed by De Hollingshead, RPH-CPP (Pharmacist)  on 12/17/20 at [place] List Status: <None>   Medication Order Taking? Sig Documenting Provider Last Dose Status Informant  allopurinol (ZYLOPRIM) 100 MG tablet [id number] Yes Take 1 tablet (100 mg total) by mouth daily. Crecencio Mc, MD Taking Active   atorvastatin (LIPITOR) 20 MG tablet [id number] Yes TAKE 1 TABLET(20 MG) BY MOUTH DAILY Crecencio Mc, MD Taking Active   Cyanocobalamin ([employer]S VITAMIN B-12) 5000 MCG SUBL [id number]  Place 5,000 mcg under the tongue daily. [provider]  Active   dapagliflozin propanediol (FARXIGA) 10 MG TABS tablet [id number]  Take 1 tablet (10 mg total) by mouth daily. Crecencio Mc, MD  Active   hydrochlorothiazide  (HYDRODIURIL) 25 MG tablet [id number] Yes TAKE 1 TABLET(25 MG) BY MOUTH DAILY Crecencio Mc, MD Taking Active   losartan (COZAAR) 100 MG tablet [id number] Yes TAKE 1 TABLET(100 MG) BY MOUTH DAILY Crecencio Mc, MD Taking Active   metFORMIN (GLUCOPHAGE) 500 MG tablet [id number] Yes Take 1 tablet (500 mg total) by mouth 2 (two) times daily with a meal. TAKE 1 TABLET(850 MG) BY MOUTH TWICE DAILY WITH A MEAL Crecencio Mc, MD Taking Active   warfarin (COUMADIN) 3 MG tablet [id number] Yes TAKE 1 TABLET(3 MG) BY MOUTH DAILY Crecencio Mc, MD Taking Active             Patient Active Problem List   Diagnosis Date Noted   CKD stage 3 due to type 2 diabetes mellitus ([place]) 07/15/2020   Aortic atherosclerosis ([place]) 07/15/2020   B12 deficiency 04/24/2020   Acquired thrombophilia ([place]) 04/05/2020   Vertigo of central origin 03/26/2018   Cataract cortical, senile, bilateral 03/24/2018   Fatigue 03/24/2018   Type 2 diabetes mellitus with microalbuminuria ([place]) 07/02/2017   Left knee pain 03/22/2017   Erectile dysfunction 04/21/2016   Anticoagulation monitoring, INR range 2-3 09/10/2014   Encounter for preventive measure 03/13/2014   Hyperlipidemia    Polyarthritis of multiple sites 02/05/2014   Long term current use of anticoagulant therapy 12/08/2013   Hyperlipidemia associated with type 2 diabetes mellitus ([place]) 03/03/2012   Nephropathy, diabetic ([place]) 08/31/2011   Patent foramen ovale    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[place])    Hypertension 04/23/2011   Screening for colon cancer 04/22/2011   OSA (obstructive sleep apnea) 04/22/2011   Clotting disorder ([place])    History of renal calculi    Gout, arthritis    Pulmonary nodule, right     Immunization History  Administered Date(s) Administered   Influenza Split 03/02/2012   Influenza, High Dose Seasonal PF 03/12/2014, 03/11/2015, 03/11/2016, 03/17/2017, 03/24/2018   Influenza,inj,Quad PF,6+ Mos 02/20/2013   Influenza,inj,quad,  With Preservative 03/17/2017   Influenza-Unspecified 04/09/2019, 03/08/2020   PFIZER(Purple Top)SARS-COV-2 Vaccination 06/01/2019, 06/22/2019, 02/23/2020   Pneumococcal Conjugate-13 09/06/2013   Pneumococcal Polysaccharide-23 04/22/2011   Pneumococcal-Unspecified 02/23/2016   Tdap 04/29/2011   Zoster Recombinat (Shingrix) 07/08/2017, 09/28/2017   Zoster, Live 03/23/2014    Conditions to be addressed/monitored: HTN, HLD, and DMII  Care Plan : Medication Management  Updates made by De Hollingshead, RPH-CPP since 02/11/2021 12:00 AM     Problem: Diabetes, Hypertension, Hyperlipidemia      Long-Range Goal: Disease Progression Prevention   Start Date: 08/06/2020  Recent Progress: On track  Priority: High  Note:   Unable to independently afford treatment regimen Unable to achieve control of diabetes   Pharmacist Clinical Goal(s):  Over the next 90 days, patient will achieve control of diabetes as evidenced by  A1c through collaboration with PharmD and provider.   Interventions: 1:1 collaboration with Crecencio Mc, MD regarding development and update of comprehensive plan of care as evidenced by provider attestation and co-signature Inter-disciplinary care team collaboration (see longitudinal plan of care) Comprehensive medication review performed; medication list updated in electronic medical record  Health Maintenance Yearly diabetic eye exam: due Yearly diabetic foot exam: up to date Yearly influenza vaccination: due - encourage yearly influenza vaccine Td/Tdap vaccination: due Pneumonia vaccination: up to date COVID vaccinations: due - recommend bivalent booster Shingrix vaccinations: up to date Colonoscopy: up to date  Diabetes: Uncontrolled; current treatment: metformin 500 mg BID, Farxiga 10 mg daily Renal function appropriate to continue current metformin dose, as eGFR is >30. [place] to follow up on status of Farxiga 10 mg dose. They will process  as an early refill (as he just received the 5 mg dose) and ship out.   Hypertension: Controlled per last office visit; current treatment: HCTZ 25 mg QAM, losartan 100 mg QAM Previously recommended to continue current regimen at this time.    Hyperlipidemia: Controlled per last lipid panel; current treatment: atorvastatin 20 mg daily   Previously recommended to continue current regimen at this time  Gout: Controlled per patient symptom report and uric acid; current regimen: allopurinol 100 mg daily - recently reduced by nephrology due to renal function (though there is not a maximum dose for allopurinol based on renal function once patient is titrated to goal uric acid and is tolerated) Uric acid remains at goal <6 Previously recommended to continue current regimen at this time  Clotting Disorder: Overdue for recheck; current regimen: warfarin 3 mg daily INR goal 2-3, managed by PCP Previously reviewed clinical history. Brachiocephalic artery thrombus requiring thrombectomy in 2011. Per chart review of Media tab, 07/2020 lab work by Dr. Hoy Register 07/2010 noted "No lupus anticoagulant detected. PTT-LAA results are consistent with a deficiency of one or more intrinsic pathway factors". Visit note from Dr. Alba Destine 07/2010 references a prior positive lupus inhibitor result, but references that "f/u lupus anticoagulant not detected, ANA positive". Cardiology worked up for patent foramen ovale. 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  Given variability in lab results relating to lupus anticoagulant and advent of more recent data w/ DOAC, could consider referral back to hematology to discuss if future transition to [place] would be clinically appropriate. Would require conversation regarding cost/benefit of DOAC vs warfarin + regular testing. Can support with discussion regarding patient assistance.   Supplements: Vitamin  B12 5000 mcg daily;    Patient Goals/Self-Care Activities Over the next 90 days, patient will:  - take medications as prescribed check glucose daily, document, and provide at future appointments check blood pressure periodically, document, and provide at future appointments collaborate with provider on medication access solutions  Follow Up Plan: Telephone follow up appointment with care management team member scheduled for: ~ 6 weeks      Medication Assistance:  Wilder Glade obtained through [facility] medication assistance program.  Enrollment ends 05/24/21  Patient's preferred pharmacy is:  [facility] DR[facility] STORE #[id number] [place], [place] - [address] AT [facility] [address] [place] [address] Phone: [phone] Fax: [place], [address] [place] [address] Phone: [phone] Fax: [phone]   Follow Up:  Patient agrees to Care Plan and  Follow-up.   Plan: Telephone follow up appointment with care management team member scheduled for:  ~6 weeks  Catie Darnelle Maffucci, PharmD, [place], [place] Clinical Pharmacist [employer] at [employer] [phone]

## 2021-02-11 NOTE — Patient Instructions (Signed)
Visit Information  PATIENT GOALS:  Goals Addressed               This Visit's Progress     Patient Stated     Medication Monitoring (pt-stated)        Patient Goals/Self-Care Activities Over the next 90 days, patient will:  - take medications as prescribed check glucose daily, document, and provide at future appointments check blood pressure periodically, document, and provide at future appointments collaborate with provider on medication access solutions        Patient verbalizes understanding of instructions provided today and agrees to view in MyChart.    Plan: Telephone follow up appointment with care management team member scheduled for:  ~ 6 weeks  Catie Ryana Montecalvo, PharmD, BCACP, CPP Clinical Pharmacist [facility] at [place] [phone]   

## 2021-02-17 ENCOUNTER — Other Ambulatory Visit: Payer: Self-pay

## 2021-02-17 ENCOUNTER — Ambulatory Visit (INDEPENDENT_AMBULATORY_CARE_PROVIDER_SITE_OTHER): Payer: Medicare Other | Admitting: Internal Medicine

## 2021-02-17 ENCOUNTER — Encounter: Payer: Self-pay | Admitting: Internal Medicine

## 2021-02-17 VITALS — BP 116/68 | HR 79 | Temp 96.1°F | Ht 67.01 in | Wt 184.8 lb

## 2021-02-17 DIAGNOSIS — Z7901 Long term (current) use of anticoagulants: Secondary | ICD-10-CM | POA: Diagnosis not present

## 2021-02-17 DIAGNOSIS — E785 Hyperlipidemia, unspecified: Secondary | ICD-10-CM

## 2021-02-17 DIAGNOSIS — E1169 Type 2 diabetes mellitus with other specified complication: Secondary | ICD-10-CM

## 2021-02-17 DIAGNOSIS — N183 Chronic kidney disease, stage 3 unspecified: Secondary | ICD-10-CM

## 2021-02-17 DIAGNOSIS — E1122 Type 2 diabetes mellitus with diabetic chronic kidney disease: Secondary | ICD-10-CM | POA: Diagnosis not present

## 2021-02-17 DIAGNOSIS — M130 Polyarthritis, unspecified: Secondary | ICD-10-CM

## 2021-02-17 NOTE — Patient Instructions (Addendum)
If you use naprosyn, Aleve, Motrin or Advil it can harm your kidneys   You should use tylenol instead .  You can take up to 2000 mg of acetominophen (tylenol) every day safely  In divided doses (500 mg every 6 hours  Or 1000 mg every 12 hours.)   Your blood sugars are too high.  Your "fasting" sugar should be under 130 and your sugar after eating (2 hours ) should be 160 or less   Crackers ,  corn and potato salad, cereal, and bread are HIGH in carbohydrates     Return for blood work anytime after sept 28

## 2021-02-17 NOTE — Progress Notes (Signed)
Subjective:  Patient ID: Stephen Kelly, male    DOB: 1940/06/19  Age: 80 y.o. MRN: [id number]  CC: The primary encounter diagnosis was Anticoagulation monitoring, INR range 2-3. Diagnoses of Hyperlipidemia associated with type 2 diabetes mellitus (HCC), Polyarthritis, CKD stage 3 due to type 2 diabetes mellitus (HCC), and Polyarthritis of multiple sites were also pertinent to this visit.  HPI RAYLEE ADAMEC presents for  3 month follow up on type 2 DM, hypertesnion and hyperlipidemia  Chief Complaint  Patient presents with   Follow-up   Pain    Pt is having pain in his shoulders, back of knees and fingers   This visit occurred during the SARS-CoV-2 public health emergency.  Safety protocols were in place, including screening questions prior to the visit, additional usage of staff PPE, and extensive cleaning of exam room while observing appropriate contact time as indicated for disinfecting solutions.   1) Joint pain has been present for 2 weeks.  Worse in the am after sleeping,  but lasts 3-4 hours and never completely relieves.  Fingers are  stiff and swollen  are   Type 2 DM:  eye exam report received.  No retinopathy. Fasting today  162 which he states is average.  Also before bedtime 160 to 170. He has resumed  metformin and farxiga at 10 mg daily .  Intentionally losing weight.   Goal is 175.  Diet reviewed.   Outpatient Medications Prior to Visit  Medication Sig Dispense Refill   allopurinol (ZYLOPRIM) 100 MG tablet Take 1 tablet (100 mg total) by mouth daily. 30 tablet 6   atorvastatin (LIPITOR) 20 MG tablet TAKE 1 TABLET(20 MG) BY MOUTH DAILY 90 tablet 2   Cyanocobalamin ([employer]S VITAMIN B-12) 5000 MCG SUBL Place 5,000 mcg under the tongue daily.     dapagliflozin propanediol (FARXIGA) 10 MG TABS tablet Take 1 tablet (10 mg total) by mouth daily. 90 tablet 3   hydrochlorothiazide (HYDRODIURIL) 25 MG tablet TAKE 1 TABLET(25 MG) BY MOUTH DAILY 90 tablet 1   losartan (COZAAR) 100 MG tablet  TAKE 1 TABLET(100 MG) BY MOUTH DAILY 90 tablet 1   metFORMIN (GLUCOPHAGE) 500 MG tablet Take 1 tablet (500 mg total) by mouth 2 (two) times daily with a meal. TAKE 1 TABLET(850 MG) BY MOUTH TWICE DAILY WITH A MEAL 180 tablet 2   warfarin (COUMADIN) 3 MG tablet TAKE 1 TABLET(3 MG) BY MOUTH DAILY 90 tablet 1   No facility-administered medications prior to visit.    Review of Systems;  Patient denies headache, fevers, malaise, unintentional weight loss, skin rash, eye pain, sinus congestion and sinus pain, sore throat, dysphagia,  hemoptysis , cough, dyspnea, wheezing, chest pain, palpitations, orthopnea, edema, abdominal pain, nausea, melena, diarrhea, constipation, flank pain, dysuria, hematuria, urinary  Frequency, nocturia, numbness, tingling, seizures,  Focal weakness, Loss of consciousness,  Tremor, insomnia, depression, anxiety, and suicidal ideation.      Objective:  BP 116/68   Pulse 79   Temp (!) 96.1 F (35.6 C)   Ht 5' 7.01" (1.702 m)   Wt 184 lb 12.8 oz (83.8 kg)   SpO2 95%   BMI 28.94 kg/m   BP Readings from Last 3 Encounters:  02/17/21 116/68  11/13/20 118/80  07/15/20 140/66    Wt Readings from Last 3 Encounters:  02/17/21 184 lb 12.8 oz (83.8 kg)  11/13/20 188 lb 9.6 oz (85.5 kg)  07/15/20 204 lb 9.6 oz (92.8 kg)    General appearance: alert, cooperative  and appears stated age Ears: normal TM's and external ear canals both ears Throat: lips, mucosa, and tongue normal; teeth and gums normal Neck: no adenopathy, no carotid bruit, supple, symmetrical, trachea midline and thyroid not enlarged, symmetric, no tenderness/mass/nodules Back: symmetric, no curvature. ROM normal. No CVA tenderness. Lungs: clear to auscultation bilaterally Heart: regular rate and rhythm, S1, S2 normal, no murmur, click, rub or gallop Abdomen: soft, non-tender; bowel sounds normal; no masses,  no organomegaly Pulses: 2+ and symmetric Skin: Skin color, texture, turgor normal. No rashes or  lesions Lymph nodes: Cervical, supraclavicular, and axillary nodes normal.  Lab Results  Component Value Date   HGBA1C 7.5 (H) 11/19/2020   HGBA1C 7.5 (H) 08/14/2020   HGBA1C 8.0 (H) 04/22/2020    Lab Results  Component Value Date   CREATININE 1.83 (H) 08/14/2020   CREATININE 1.46 04/22/2020   CREATININE 1.57 (H) 01/18/2020    Lab Results  Component Value Date   WBC 9.0 09/27/2019   HGB 12.9 (L) 09/27/2019   HCT 37.9 (L) 09/27/2019   PLT 233.0 09/27/2019   GLUCOSE 169 (H) 08/14/2020   CHOL 102 11/19/2020   TRIG 242.0 (H) 11/19/2020   HDL 28.90 (L) 11/19/2020   LDLDIRECT 42.0 11/19/2020   LDLCALC 17 03/17/2017   ALT 12 08/14/2020   AST 13 08/14/2020   NA 136 08/14/2020   K 3.8 08/14/2020   CL 99 08/14/2020   CREATININE 1.83 (H) 08/14/2020   BUN 37 (H) 08/14/2020   CO2 27 08/14/2020   TSH 2.25 09/27/2019   PSA 2.12 11/19/2020   INR 2.1 (H) 11/19/2020   HGBA1C 7.5 (H) 11/19/2020   MICROALBUR 24.3 (H) 04/22/2020    US RENAL  Result Date: 11/04/2020 CLINICAL DATA:  Stage IIIB chronic kidney disease.  Proteinuria. EXAM: RENAL / URINARY TRACT ULTRASOUND COMPLETE COMPARISON:  None. FINDINGS: Right Kidney: Renal measurements: 10.0 x 5.2 x 4.6 cm = volume: 124 mL. Mild cortical thinning. Slightly increased echogenicity. No mass or hydronephrosis. Left Kidney: Renal measurements: 11.5 x 5.6 x 5.0 cm = volume: 168 mL. 1.3 cm upper pole cyst. No hydronephrosis. Echogenicity slightly increased. Bladder: Appears normal for degree of bladder distention. Other: None. IMPRESSION: Kidneys within normal limits in size. Slight increased echogenicity of the cortical tissue. No hydronephrosis or significant focal finding. 1.3 cm cyst upper pole left kidney. Electronically Signed   By: Paulina Fusi M.D.   On: 11/04/2020 14:52    Assessment & Plan:   Problem List Items Addressed This Visit       Unprioritized   Anticoagulation monitoring, INR range 2-3 - Primary    Inr  is therapeutic  on 3 mg daily .  He has been advised to continue current regimen and repeat PT/INR in one month  Lab Results  Component Value Date   INR 2.1 (H) 11/19/2020   INR 2.2 (H) 10/31/2020   INR 2.6 (H) 07/15/2020        Relevant Orders   Protime-INR   CKD stage 3 due to type 2 diabetes mellitus (HCC)    GFR had been stable for the last several years,  But became lower this year and he was referred to neprhology.  Metformin was resumed  and allopurinol dose was reduced   Lab Results  Component Value Date   CREATININE 1.83 (H) 08/14/2020   Lab Results  Component Value Date   MICROALBUR 24.3 (H) 04/22/2020   MICROALBUR 15.9 (H) 09/27/2019  Hyperlipidemia associated with type 2 diabetes mellitus (HCC)    His diabetes remains uncontrolled based on  Blood sugar reports.  Return in a few days for labs.  Continue metformin and farxiga.  LDL is  < 70  with 20 mg atorvastatin . LFTs are normal and he is tolerating therapy.    Lab Results  Component Value Date   CHOL 102 11/19/2020   HDL 28.90 (L) 11/19/2020   LDLCALC 17 03/17/2017   LDLDIRECT 42.0 11/19/2020   TRIG 242.0 (H) 11/19/2020   CHOLHDL 4 11/19/2020   Lab Results  Component Value Date   ALT 12 08/14/2020   AST 13 08/14/2020   ALKPHOS 51 08/14/2020   BILITOT 0.3 08/14/2020             Relevant Orders   Hemoglobin A1c   Comprehensive metabolic panel   Lipid panel   Microalbumin / creatinine urine ratio   Polyarthritis of multiple sites    He has sausage digiets.  Checking labs for signs of psoriatic arthritis       Other Visit Diagnoses     Polyarthritis       Relevant Orders   Sedimentation rate   ANA   Cyclic citrul peptide antibody, IgG       I provided 40 minutes of  face-to-face time during this encounter reviewing patient's current problems and past surgeries, labs and imaging studies, providing counseling on the above mentioned problems , and coordination  of care .   There are no  discontinued medications.  Follow-up: Return in about 3 months (around 05/19/2021).   Sherlene Shams, MD

## 2021-02-17 NOTE — Assessment & Plan Note (Signed)
He has sausage digiets.  Checking labs for signs of psoriatic arthritis

## 2021-02-17 NOTE — Assessment & Plan Note (Signed)
His diabetes remains uncontrolled based on  Blood sugar reports.  Return in a few days for labs.  Continue metformin and farxiga.  LDL is  < 70  with 20 mg atorvastatin . LFTs are normal and he is tolerating therapy.    Lab Results  Component Value Date   CHOL 102 11/19/2020   HDL 28.90 (L) 11/19/2020   LDLCALC 17 03/17/2017   LDLDIRECT 42.0 11/19/2020   TRIG 242.0 (H) 11/19/2020   CHOLHDL 4 11/19/2020   Lab Results  Component Value Date   ALT 12 08/14/2020   AST 13 08/14/2020   ALKPHOS 51 08/14/2020   BILITOT 0.3 08/14/2020

## 2021-02-17 NOTE — Assessment & Plan Note (Signed)
GFR had been stable for the last several years,  But became lower this year and he was referred to neprhology.  Metformin was resumed  and allopurinol dose was reduced   Lab Results  Component Value Date   CREATININE 1.83 (H) 08/14/2020   Lab Results  Component Value Date   MICROALBUR 24.3 (H) 04/22/2020   MICROALBUR 15.9 (H) 09/27/2019

## 2021-02-17 NOTE — Assessment & Plan Note (Signed)
Inr  is therapeutic on 3 mg daily .  He has been advised to continue current regimen and repeat PT/INR in one month  Lab Results  Component Value Date   INR 2.1 (H) 11/19/2020   INR 2.2 (H) 10/31/2020   INR 2.6 (H) 07/15/2020

## 2021-02-21 DIAGNOSIS — E1129 Type 2 diabetes mellitus with other diabetic kidney complication: Secondary | ICD-10-CM | POA: Diagnosis not present

## 2021-02-21 DIAGNOSIS — N183 Chronic kidney disease, stage 3 unspecified: Secondary | ICD-10-CM | POA: Diagnosis not present

## 2021-02-21 DIAGNOSIS — R809 Proteinuria, unspecified: Secondary | ICD-10-CM

## 2021-02-21 DIAGNOSIS — E1169 Type 2 diabetes mellitus with other specified complication: Secondary | ICD-10-CM

## 2021-02-21 DIAGNOSIS — E785 Hyperlipidemia, unspecified: Secondary | ICD-10-CM

## 2021-02-21 DIAGNOSIS — I10 Essential (primary) hypertension: Secondary | ICD-10-CM

## 2021-02-21 DIAGNOSIS — E1122 Type 2 diabetes mellitus with diabetic chronic kidney disease: Secondary | ICD-10-CM

## 2021-02-27 ENCOUNTER — Other Ambulatory Visit: Payer: Medicare Other

## 2021-03-03 ENCOUNTER — Ambulatory Visit (INDEPENDENT_AMBULATORY_CARE_PROVIDER_SITE_OTHER): Payer: Medicare Other | Admitting: Pharmacist

## 2021-03-03 DIAGNOSIS — E785 Hyperlipidemia, unspecified: Secondary | ICD-10-CM

## 2021-03-03 DIAGNOSIS — E1129 Type 2 diabetes mellitus with other diabetic kidney complication: Secondary | ICD-10-CM

## 2021-03-03 DIAGNOSIS — E1122 Type 2 diabetes mellitus with diabetic chronic kidney disease: Secondary | ICD-10-CM

## 2021-03-03 DIAGNOSIS — E1169 Type 2 diabetes mellitus with other specified complication: Secondary | ICD-10-CM

## 2021-03-03 NOTE — Patient Instructions (Signed)
Mr. Susman,   It was great talking with you today!  Check your blood sugars twice daily:  1) Fasting, first thing in the morning before breakfast and  2) 2 hours after your largest meal.   For a goal A1c of less than 7%, goal fasting readings are less than 130 and goal 2 hour after meal readings are less than 180.   We recommend you get the influenza vaccine for this season.   We recommend you get the updated bivalent COVID-19 booster, at least 2 months after any prior doses. You may consider delaying a booster dose by 3 months from a prior episode of COVID-19 per the CDC.   You can find pharmacies that have this formulation in stock at [email or web address].   Take care!  Catie Feliz Beam, PharmD  Visit Information  PATIENT GOALS:  Goals Addressed               This Visit's Progress     Patient Stated     Medication Monitoring (pt-stated)        Patient Goals/Self-Care Activities Over the next 90 days, patient will:  - take medications as prescribed check glucose daily, document, and provide at future appointments check blood pressure periodically, document, and provide at future appointments collaborate with provider on medication access solutions        Patient verbalizes understanding of instructions provided today and agrees to view in MyChart.    Plan: Telephone follow up appointment with care management team member scheduled for:  8 weeks  Catie Feliz Beam, PharmD, [place], CPP Clinical Pharmacist [employer] at [employer] [phone]

## 2021-03-03 NOTE — Chronic Care Management (AMB) (Signed)
Chronic Care Management Pharmacy Note  03/03/2021 Name:  Stephen Kelly MRN:  [id number] DOB:  07-Jun-1940  Subjective: NEIMAN ROOTS is an 80 y.o. year old male who is a primary patient of Tullo, Aris Everts, MD.  The CCM team was consulted for assistance with disease management and care coordination needs.    Engaged with patient by telephone for follow up visit in response to provider referral for pharmacy case management and/or care coordination services.   Consent to Services:  The patient was given information about Chronic Care Management services, agreed to services, and gave verbal consent prior to initiation of services.  Please see initial visit note for detailed documentation.   Patient Care Team: Crecencio Mc, MD as PCP - General (Internal Medicine) De Hollingshead, RPH-CPP (Pharmacist)   Objective:  Lab Results  Component Value Date   CREATININE 1.83 (H) 08/14/2020   CREATININE 1.46 04/22/2020   CREATININE 1.57 (H) 01/18/2020    Lab Results  Component Value Date   HGBA1C 7.5 (H) 11/19/2020   Last diabetic Eye exam:  Lab Results  Component Value Date/Time   HMDIABEYEEXA No Retinopathy 12/09/2020 12:00 AM    Last diabetic Foot exam:  Lab Results  Component Value Date/Time   HMDIABFOOTEX NORMAL 03/12/2014 12:00 AM        Component Value Date/Time   CHOL 102 11/19/2020 0753   TRIG 242.0 (H) 11/19/2020 0753   HDL 28.90 (L) 11/19/2020 0753   CHOLHDL 4 11/19/2020 0753   VLDL 48.4 (H) 11/19/2020 0753   LDLCALC 17 03/17/2017 1106   LDLDIRECT 42.0 11/19/2020 0753    Hepatic Function Latest Ref Rng & Units 08/14/2020 04/22/2020 01/18/2020  Total Protein 6.0 - 8.3 g/dL 6.9 6.8 6.7  Albumin 3.5 - 5.2 g/dL 4.3 4.3 4.0  AST 0 - 37 U/L $Remo'13 13 12  'Rmonz$ ALT 0 - 53 U/L $Remo'12 13 11  'xKRlH$ Alk Phosphatase 39 - 117 U/L 51 44 45  Total Bilirubin 0.2 - 1.2 mg/dL 0.3 0.6 0.6    Lab Results  Component Value Date/Time   TSH 2.25 09/27/2019 10:17 AM   TSH 2.96 03/29/2019 02:27  PM    CBC Latest Ref Rng & Units 09/27/2019 03/29/2019 01/17/2019  WBC 4.0 - 10.5 K/uL 9.0 10.7(H) 11.4(H)  Hemoglobin 13.0 - 17.0 g/dL 12.9(L) 12.8(L) 13.2  Hematocrit 39.0 - 52.0 % 37.9(L) 37.9(L) 38.3(L)  Platelets 150.0 - 400.0 K/uL 233.0 277.0 283    Lab Results  Component Value Date/Time   VD25OH 31.98 03/11/2016 10:19 AM     Social History   Tobacco Use  Smoking Status Never  Smokeless Tobacco Never   BP Readings from Last 3 Encounters:  02/17/21 116/68  11/13/20 118/80  07/15/20 140/66   Pulse Readings from Last 3 Encounters:  02/17/21 79  11/13/20 81  07/15/20 (!) 101   Wt Readings from Last 3 Encounters:  02/17/21 184 lb 12.8 oz (83.8 kg)  11/13/20 188 lb 9.6 oz (85.5 kg)  07/15/20 204 lb 9.6 oz (92.8 kg)    Assessment: Review of patient past medical history, allergies, medications, health status, including review of consultants reports, laboratory and other test data, was performed as part of comprehensive evaluation and provision of chronic care management services.   SDOH:  (Social Determinants of Health) assessments and interventions performed:  SDOH Interventions    Flowsheet Row Most Recent Value  SDOH Interventions   Financial Strain Interventions Other (Comment)  [manufacturer assistance]  CCM Care Plan  Allergies  Allergen Reactions   Azithromycin Rash    Medications Reviewed Today     Reviewed by Sherley Bounds, CMA (Certified Medical Assistant) on 02/17/21 at 0810  Med List Status: <None>   Medication Order Taking? Sig Documenting Provider Last Dose Status Informant  allopurinol (ZYLOPRIM) 100 MG tablet [id number] Yes Take 1 tablet (100 mg total) by mouth daily. Sherlene Shams, MD Taking Active   atorvastatin (LIPITOR) 20 MG tablet [id number] Yes TAKE 1 TABLET(20 MG) BY MOUTH DAILY Sherlene Shams, MD Taking Active   Cyanocobalamin ([employer]S VITAMIN B-12) 5000 MCG SUBL [id number] Yes Place 5,000 mcg under the tongue daily. [provider] Taking Active   dapagliflozin propanediol (FARXIGA) 10 MG TABS tablet [id number] Yes Take 1 tablet (10 mg total) by mouth daily. Sherlene Shams, MD Taking Active   hydrochlorothiazide (HYDRODIURIL) 25 MG tablet [id number] Yes TAKE 1 TABLET(25 MG) BY MOUTH DAILY Sherlene Shams, MD Taking Active   losartan (COZAAR) 100 MG tablet [id number] Yes TAKE 1 TABLET(100 MG) BY MOUTH DAILY Sherlene Shams, MD Taking Active   metFORMIN (GLUCOPHAGE) 500 MG tablet [id number] Yes Take 1 tablet (500 mg total) by mouth 2 (two) times daily with a meal. TAKE 1 TABLET(850 MG) BY MOUTH TWICE DAILY WITH A MEAL Sherlene Shams, MD Taking Active   warfarin (COUMADIN) 3 MG tablet [id number] Yes TAKE 1 TABLET(3 MG) BY MOUTH DAILY Sherlene Shams, MD Taking Active             Patient Active Problem List   Diagnosis Date Noted   CKD stage 3 due to type 2 diabetes mellitus (HCC) 07/15/2020   Aortic atherosclerosis (HCC) 07/15/2020   B12 deficiency 04/24/2020   Acquired thrombophilia (HCC) 04/05/2020   Vertigo of central origin 03/26/2018   Cataract cortical, senile, bilateral 03/24/2018   Fatigue 03/24/2018   Type 2 diabetes mellitus with microalbuminuria (HCC) 07/02/2017   Left knee pain 03/22/2017   Erectile dysfunction 04/21/2016   Anticoagulation monitoring, INR range 2-3 09/10/2014   Encounter for preventive measure 03/13/2014   Hyperlipidemia    Polyarthritis of multiple sites 02/05/2014   Long term current use of anticoagulant therapy 12/08/2013   Hyperlipidemia associated with type 2 diabetes mellitus (HCC) 03/03/2012   Nephropathy, diabetic (HCC) 08/31/2011   Patent foramen ovale    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HCC)    Hypertension 04/23/2011   Screening for colon cancer 04/22/2011   OSA (obstructive sleep apnea) 04/22/2011   Clotting disorder (HCC)    History of renal calculi    Gout, arthritis    Pulmonary nodule, right     Immunization History  Administered Date(s)  Administered   Influenza Split 03/02/2012   Influenza, High Dose Seasonal PF 03/12/2014, 03/11/2015, 03/11/2016, 03/17/2017, 03/24/2018   Influenza,inj,Quad PF,6+ Mos 02/20/2013   Influenza,inj,quad, With Preservative 03/17/2017   Influenza-Unspecified 04/09/2019, 03/08/2020   PFIZER(Purple Top)SARS-COV-2 Vaccination 06/01/2019, 06/22/2019, 02/23/2020   Pneumococcal Conjugate-13 09/06/2013   Pneumococcal Polysaccharide-23 04/22/2011   Pneumococcal-Unspecified 02/23/2016   Tdap 04/29/2011   Zoster Recombinat (Shingrix) 07/08/2017, 09/28/2017   Zoster, Live 03/23/2014    Conditions to be addressed/monitored: HTN, HLD, Hypertriglyceridemia, and DMII  Care Plan : Medication Management  Updates made by Lourena Simmonds, RPH-CPP since 03/03/2021 12:00 AM     Problem: Diabetes, Hypertension, Hyperlipidemia      Long-Range Goal: Disease Progression Prevention   Start Date: 08/06/2020  This Visit's Progress: On track  Recent Progress: On track  Priority: High  Note:   Unable to independently afford treatment regimen Unable to achieve control of diabetes   Pharmacist Clinical Goal(s):  Over the next 90 days, patient will achieve control of diabetes as evidenced by A1c through collaboration with PharmD and provider.   Interventions: 1:1 collaboration with Crecencio Mc, MD regarding development and update of comprehensive plan of care as evidenced by provider attestation and co-signature Inter-disciplinary care team collaboration (see longitudinal plan of care) Comprehensive medication review performed; medication list updated in electronic medical record  Health Maintenance Yearly diabetic eye exam: due Yearly diabetic foot exam: up to date Yearly influenza vaccination: due - encourage yearly influenza vaccine Td/Tdap vaccination: due Pneumonia vaccination: up to date COVID vaccinations: due - recommend bivalent booster Shingrix vaccinations: up to date Colonoscopy: up to  date   SDOH: Reports wife had a heart attack last week, is at [facility] right now s/p 3 vessel stenting. He has been at the hospital with her for the past few days, came home early this morning to sleep and is going back this afternoon.   Diabetes: Uncontrolled; current treatment: metformin 500 mg BID, Farxiga 10 mg daily Approved for Farxiga assistance through [place] through 05/24/21 Renal function appropriate to continue current metformin dose, as eGFR is >30. Current glucose readings: fastings 160-170s, not checking 2 hour post prandials Reviewed goal A1c, goal fasting, and goal 2 hour post prandial glucose. Encouraged periodic post prandial checks to more fully evaluate glucose control.  Recommended to continue current regimen at this time.   Hypertension: Controlled per last office visit; current treatment: HCTZ 25 mg QAM, losartan 100 mg QAM Previously recommended to continue current regimen at this time.    Hyperlipidemia: Controlled per last lipid panel; current treatment: atorvastatin 20 mg daily   Previously recommended to continue current regimen at this time  Gout: Controlled per patient symptom report and uric acid; current regimen: allopurinol 100 mg daily - recently reduced by nephrology due to renal function (though there is not a maximum dose for allopurinol based on renal function once patient is titrated to goal uric acid and is tolerated) Uric acid remains at goal <6 Previously recommended to continue current regimen at this time  Clotting Disorder, (hx lupus anticoagulant not detected, ANA positive): Overdue for recheck; current regimen: warfarin 3 mg daily INR goal 2-3, managed by PCP Previously recommended to continue current regimen at this time  Supplements: Vitamin B12 5000 mcg daily;    Patient Goals/Self-Care Activities Over the next 90 days, patient will:  - take medications as prescribed check glucose daily, document, and provide at future  appointments check blood pressure periodically, document, and provide at future appointments collaborate with provider on medication access solutions  Follow Up Plan: Telephone follow up appointment with care management team member scheduled for: ~ 8 weeks      Medication Assistance:  Wilder Glade obtained through [facility] medication assistance program.  Enrollment ends 05/24/21  Patient's preferred pharmacy is:  [id number] Lorina Rabon, [place] - [place] AT [facility] [address] [place] [address] Phone: [phone] Fax: [place], [place] [address] [place] [address] Phone: [phone] Fax: [phone]   Follow Up:  Patient agrees to Care Plan and Follow-up.  Plan: Telephone follow up appointment with care management team member scheduled for:  8 weeks  Catie Darnelle Maffucci, PharmD, [place], CPP Clinical Pharmacist [employer] at [place]  [place] [phone]

## 2021-03-07 ENCOUNTER — Other Ambulatory Visit: Payer: Self-pay | Admitting: Internal Medicine

## 2021-03-18 ENCOUNTER — Telehealth: Payer: Medicare Other

## 2021-03-24 ENCOUNTER — Other Ambulatory Visit (INDEPENDENT_AMBULATORY_CARE_PROVIDER_SITE_OTHER): Payer: Medicare Other

## 2021-03-24 ENCOUNTER — Other Ambulatory Visit: Payer: Self-pay

## 2021-03-24 DIAGNOSIS — E1169 Type 2 diabetes mellitus with other specified complication: Secondary | ICD-10-CM

## 2021-03-24 DIAGNOSIS — R809 Proteinuria, unspecified: Secondary | ICD-10-CM

## 2021-03-24 DIAGNOSIS — N183 Chronic kidney disease, stage 3 unspecified: Secondary | ICD-10-CM

## 2021-03-24 DIAGNOSIS — M130 Polyarthritis, unspecified: Secondary | ICD-10-CM | POA: Diagnosis not present

## 2021-03-24 DIAGNOSIS — E785 Hyperlipidemia, unspecified: Secondary | ICD-10-CM

## 2021-03-24 DIAGNOSIS — E1129 Type 2 diabetes mellitus with other diabetic kidney complication: Secondary | ICD-10-CM | POA: Diagnosis not present

## 2021-03-24 DIAGNOSIS — E1122 Type 2 diabetes mellitus with diabetic chronic kidney disease: Secondary | ICD-10-CM

## 2021-03-24 DIAGNOSIS — M2550 Pain in unspecified joint: Secondary | ICD-10-CM

## 2021-03-24 DIAGNOSIS — Z7901 Long term (current) use of anticoagulants: Secondary | ICD-10-CM | POA: Diagnosis not present

## 2021-03-24 LAB — COMPREHENSIVE METABOLIC PANEL
ALT: 7 U/L (ref 0–53)
AST: 9 U/L (ref 0–37)
Albumin: 4 g/dL (ref 3.5–5.2)
Alkaline Phosphatase: 39 U/L (ref 39–117)
BUN: 34 mg/dL — ABNORMAL HIGH (ref 6–23)
CO2: 25 mEq/L (ref 19–32)
Calcium: 9.7 mg/dL (ref 8.4–10.5)
Chloride: 102 mEq/L (ref 96–112)
Creatinine, Ser: 1.65 mg/dL — ABNORMAL HIGH (ref 0.40–1.50)
GFR: 39.21 mL/min — ABNORMAL LOW (ref >60.00)
Glucose, Bld: 134 mg/dL — ABNORMAL HIGH (ref 70–99)
Potassium: 3.9 mEq/L (ref 3.5–5.1)
Sodium: 138 mEq/L (ref 135–145)
Total Bilirubin: 0.7 mg/dL (ref 0.2–1.2)
Total Protein: 7.3 g/dL (ref 6.0–8.3)

## 2021-03-24 LAB — LIPID PANEL
Cholesterol: 114 mg/dL (ref 0–200)
HDL: 32.9 mg/dL — ABNORMAL LOW (ref >39.00)
LDL Cholesterol: 42 mg/dL (ref 0–99)
NonHDL: 81.06
Total CHOL/HDL Ratio: 3
Triglycerides: 195 mg/dL — ABNORMAL HIGH (ref 0.0–149.0)
VLDL: 39 mg/dL (ref 0.0–40.0)

## 2021-03-24 LAB — HEMOGLOBIN A1C: Hgb A1c MFr Bld: 7.4 % — ABNORMAL HIGH (ref 4.6–6.5)

## 2021-03-24 LAB — PROTIME-INR
INR: 1.8 ratio — ABNORMAL HIGH (ref 0.8–1.0)
Prothrombin Time: 19.2 s — ABNORMAL HIGH (ref 9.6–13.1)

## 2021-03-24 LAB — SEDIMENTATION RATE: Sed Rate: 39 mm/hr — ABNORMAL HIGH (ref 0–20)

## 2021-03-24 LAB — MICROALBUMIN / CREATININE URINE RATIO
Creatinine,U: 123.6 mg/dL
Microalb Creat Ratio: 3.7 mg/g (ref 0.0–30.0)
Microalb, Ur: 4.6 mg/dL — ABNORMAL HIGH (ref 0.0–1.9)

## 2021-03-26 ENCOUNTER — Ambulatory Visit (INDEPENDENT_AMBULATORY_CARE_PROVIDER_SITE_OTHER): Payer: Medicare Other

## 2021-03-26 ENCOUNTER — Other Ambulatory Visit: Payer: Self-pay

## 2021-03-26 DIAGNOSIS — Z23 Encounter for immunization: Secondary | ICD-10-CM | POA: Diagnosis not present

## 2021-03-26 LAB — ANTI-NUCLEAR AB-TITER (ANA TITER): ANA Titer 1: 1:80 {titer} — ABNORMAL HIGH

## 2021-03-26 LAB — CYCLIC CITRUL PEPTIDE ANTIBODY, IGG: Cyclic Citrullin Peptide Ab: 34 UNITS — ABNORMAL HIGH

## 2021-03-26 LAB — ANA: Anti Nuclear Antibody (ANA): POSITIVE — AB

## 2021-03-29 NOTE — Addendum Note (Signed)
Addended by: Sherlene Shams on: 03/29/2021 02:19 PM   Modules accepted: Orders

## 2021-03-29 NOTE — Assessment & Plan Note (Signed)
postiive ANA with a 1:80 titer,  Positive CCP and ESR . Referring to rheumatology

## 2021-04-01 ENCOUNTER — Ambulatory Visit: Payer: Medicare Other

## 2021-04-01 MED ORDER — WARFARIN SODIUM 1 MG PO TABS: ORAL_TABLET | ORAL | 1 refills | Status: AC

## 2021-04-01 NOTE — Assessment & Plan Note (Signed)
INR is low on 3 mg daily.  Increase to 4 mg alternating with 3 mg

## 2021-04-01 NOTE — Addendum Note (Signed)
Addended by: Sherlene Shams on: 04/01/2021 11:31 AM   Modules accepted: Orders

## 2021-04-09 DIAGNOSIS — D2261 Melanocytic nevi of right upper limb, including shoulder: Secondary | ICD-10-CM | POA: Diagnosis not present

## 2021-04-09 DIAGNOSIS — Z85828 Personal history of other malignant neoplasm of skin: Secondary | ICD-10-CM | POA: Diagnosis not present

## 2021-04-09 DIAGNOSIS — D2262 Melanocytic nevi of left upper limb, including shoulder: Secondary | ICD-10-CM | POA: Diagnosis not present

## 2021-04-09 DIAGNOSIS — D2271 Melanocytic nevi of right lower limb, including hip: Secondary | ICD-10-CM | POA: Diagnosis not present

## 2021-04-15 ENCOUNTER — Other Ambulatory Visit (INDEPENDENT_AMBULATORY_CARE_PROVIDER_SITE_OTHER): Payer: Medicare Other

## 2021-04-15 ENCOUNTER — Other Ambulatory Visit: Payer: Self-pay | Admitting: Internal Medicine

## 2021-04-15 ENCOUNTER — Other Ambulatory Visit: Payer: Self-pay

## 2021-04-15 DIAGNOSIS — Z7901 Long term (current) use of anticoagulants: Secondary | ICD-10-CM

## 2021-04-15 LAB — PROTIME-INR
INR: 3.5 ratio — ABNORMAL HIGH (ref 0.8–1.0)
Prothrombin Time: 35.5 s — ABNORMAL HIGH (ref 9.6–13.1)

## 2021-04-16 ENCOUNTER — Telehealth: Payer: Self-pay

## 2021-04-16 ENCOUNTER — Other Ambulatory Visit: Payer: Self-pay

## 2021-04-16 DIAGNOSIS — N1832 Chronic kidney disease, stage 3b: Secondary | ICD-10-CM | POA: Diagnosis not present

## 2021-04-16 DIAGNOSIS — R809 Proteinuria, unspecified: Secondary | ICD-10-CM | POA: Diagnosis not present

## 2021-04-16 DIAGNOSIS — M1000 Idiopathic gout, unspecified site: Secondary | ICD-10-CM | POA: Diagnosis not present

## 2021-04-16 DIAGNOSIS — E1129 Type 2 diabetes mellitus with other diabetic kidney complication: Secondary | ICD-10-CM | POA: Diagnosis not present

## 2021-04-16 DIAGNOSIS — Z7901 Long term (current) use of anticoagulants: Secondary | ICD-10-CM

## 2021-04-16 MED ORDER — WARFARIN SODIUM 3 MG PO TABS: ORAL_TABLET | ORAL | 1 refills | Status: AC

## 2021-04-16 NOTE — Telephone Encounter (Signed)
Protime- INR ordered for future labs

## 2021-04-22 ENCOUNTER — Other Ambulatory Visit (INDEPENDENT_AMBULATORY_CARE_PROVIDER_SITE_OTHER): Payer: Medicare Other

## 2021-04-22 ENCOUNTER — Ambulatory Visit (INDEPENDENT_AMBULATORY_CARE_PROVIDER_SITE_OTHER): Payer: Medicare Other | Admitting: Pharmacist

## 2021-04-22 ENCOUNTER — Telehealth: Payer: Self-pay | Admitting: Pharmacy Technician

## 2021-04-22 ENCOUNTER — Telehealth: Payer: Self-pay | Admitting: Internal Medicine

## 2021-04-22 ENCOUNTER — Other Ambulatory Visit: Payer: Self-pay

## 2021-04-22 DIAGNOSIS — R809 Proteinuria, unspecified: Secondary | ICD-10-CM

## 2021-04-22 DIAGNOSIS — Z596 Low income: Secondary | ICD-10-CM

## 2021-04-22 DIAGNOSIS — E1169 Type 2 diabetes mellitus with other specified complication: Secondary | ICD-10-CM

## 2021-04-22 DIAGNOSIS — E785 Hyperlipidemia, unspecified: Secondary | ICD-10-CM

## 2021-04-22 DIAGNOSIS — Z7901 Long term (current) use of anticoagulants: Secondary | ICD-10-CM

## 2021-04-22 DIAGNOSIS — E1122 Type 2 diabetes mellitus with diabetic chronic kidney disease: Secondary | ICD-10-CM

## 2021-04-22 DIAGNOSIS — E1129 Type 2 diabetes mellitus with other diabetic kidney complication: Secondary | ICD-10-CM

## 2021-04-22 LAB — PROTIME-INR
INR: 2.1 ratio — ABNORMAL HIGH (ref 0.8–1.0)
Prothrombin Time: 21.6 s — ABNORMAL HIGH (ref 9.6–13.1)

## 2021-04-22 MED ORDER — DAPAGLIFLOZIN PROPANEDIOL 10 MG PO TABS: 10.0000 mg | ORAL_TABLET | Freq: Every day | ORAL | 3 refills | Status: AC

## 2021-04-22 NOTE — Telephone Encounter (Signed)
Patient was in and wanted Catie to know he changed his address to [facility]. New address is updated. His medication is going to his old address and is requesting to go to [facility].

## 2021-04-22 NOTE — Telephone Encounter (Signed)
Advised patient to call [employer] to update address. See CCM documentation

## 2021-04-22 NOTE — Chronic Care Management (AMB) (Signed)
Chronic Care Management CCM Pharmacy Note  04/22/2021 Name:  Stephen Kelly MRN:  [id number] DOB:  04-Dec-1940  Summary: - Patient reports address changed. Attempted to provide this information to [employer] patient assistance, but wait time was too long.  Recommendations/Changes made from today's visit: - Updated script sent to [facility] for 2023.  - Advised patient to call [place] to updated address   Subjective: Stephen Kelly is an 80 y.o. year old male who is a primary patient of Stephen Kelly, Stephen Everts, MD.  The CCM team was consulted for assistance with disease management and care coordination needs.    Engaged with patient by telephone for  medication access follow up  for pharmacy case management and/or care coordination services.   Objective:  Medications Reviewed Today     Reviewed by Baron Hamper, CMA (Certified Medical Assistant) on 02/17/21 at [id number]  Med List Status: <None>   Medication Order Taking? Sig Documenting Provider Last Dose Status Informant  allopurinol (ZYLOPRIM) 100 MG tablet [id number] Yes Take 1 tablet (100 mg total) by mouth daily. Crecencio Mc, MD Taking Active   atorvastatin (LIPITOR) 20 MG tablet [id number] Yes TAKE 1 TABLET(20 MG) BY MOUTH DAILY Crecencio Mc, MD Taking Active   Cyanocobalamin ([facility] VITAMIN B-12) 5000 MCG SUBL [id number] Yes Place 5,000 mcg under the tongue daily. [provider] Taking Active   dapagliflozin propanediol (FARXIGA) 10 MG TABS tablet [id number] Yes Take 1 tablet (10 mg total) by mouth daily. Crecencio Mc, MD Taking Active   hydrochlorothiazide (HYDRODIURIL) 25 MG tablet [id number] Yes TAKE 1 TABLET(25 MG) BY MOUTH DAILY Crecencio Mc, MD Taking Active   losartan (COZAAR) 100 MG tablet [id number] Yes TAKE 1 TABLET(100 MG) BY MOUTH DAILY Crecencio Mc, MD Taking Active   metFORMIN (GLUCOPHAGE) 500 MG tablet [id number] Yes Take 1 tablet (500 mg total) by mouth 2 (two) times daily with a meal. TAKE 1 TABLET(850 MG)  BY MOUTH TWICE DAILY WITH A MEAL Crecencio Mc, MD Taking Active   warfarin (COUMADIN) 3 MG tablet [id number] Yes TAKE 1 TABLET(3 MG) BY MOUTH DAILY Crecencio Mc, MD Taking Active             Pertinent Labs:   Lab Results  Component Value Date   HGBA1C 7.4 (H) 03/24/2021   Lab Results  Component Value Date   CHOL 114 03/24/2021   HDL 32.90 (L) 03/24/2021   LDLCALC 42 03/24/2021   LDLDIRECT 42.0 11/19/2020   TRIG 195.0 (H) 03/24/2021   CHOLHDL 3 03/24/2021   Lab Results  Component Value Date   CREATININE 1.65 (H) 03/24/2021   BUN 34 (H) 03/24/2021   NA 138 03/24/2021   K 3.9 03/24/2021   CL 102 03/24/2021   CO2 25 03/24/2021    SDOH:  (Social Determinants of Health) assessments and interventions performed:  SDOH Interventions    Flowsheet Row Most Recent Value  SDOH Interventions   Financial Strain Interventions Other (Comment)  [manufacturer assistance]       CCM Care Plan  Review of patient past medical history, allergies, medications, health status, including review of consultants reports, laboratory and other test data, was performed as part of comprehensive evaluation and provision of chronic care management services.   Care Plan : Medication Management  Updates made by De Hollingshead, RPH-CPP since 04/22/2021 12:00 AM     Problem: Diabetes, Hypertension, Hyperlipidemia      Long-Range Goal: Disease Progression Prevention  Start Date: 08/06/2020  Recent Progress: On track  Priority: High  Note:   Unable to independently afford treatment regimen Unable to achieve control of diabetes   Pharmacist Clinical Goal(s):  Over the next 90 days, patient will achieve control of diabetes as evidenced by A1c through collaboration with PharmD and provider.   Interventions: 1:1 collaboration with Crecencio Mc, MD regarding development and update of comprehensive plan of care as evidenced by provider attestation and co-signature Inter-disciplinary  care team collaboration (see longitudinal plan of care) Comprehensive medication review performed; medication list updated in electronic medical record  Health Maintenance Yearly diabetic eye exam: due Yearly diabetic foot exam: up to date Yearly influenza vaccination: due - encourage yearly influenza vaccine Td/Tdap vaccination: due Pneumonia vaccination: up to date COVID vaccinations: due - recommend bivalent booster Shingrix vaccinations: up to date Colonoscopy: up to date   SDOH: Reports wife had a heart attack last week, is at [facility] right now s/p 3 vessel stenting. He has been at the hospital with her for the past few days, came home early this morning to sleep and is going back this afternoon.   Diabetes: Uncontrolled; current treatment: metformin 500 mg BID, Farxiga 10 mg daily Approved for Farxiga assistance through [place] through 05/24/21 Renal function appropriate to continue current metformin dose, as eGFR is >30. Called [place] update patient's address on file. Wait time was several hours. Advised patient to call [place] tomorrow at 8 am when they open to update address.   Hypertension: Controlled per last office visit; current treatment: HCTZ 25 mg QAM, losartan 100 mg QAM Previously recommended to continue current regimen at this time.    Hyperlipidemia: Controlled per last lipid panel; current treatment: atorvastatin 20 mg daily   Previously recommended to continue current regimen at this time  Gout: Controlled per patient symptom report and uric acid; current regimen: allopurinol 100 mg daily - recently reduced by nephrology due to renal function (though there is not a maximum dose for allopurinol based on renal function once patient is titrated to goal uric acid and is tolerated) Uric acid remains at goal <6 Previously recommended to continue current regimen at this time  Clotting Disorder, (hx lupus anticoagulant not detected, ANA positive): Overdue for  recheck; current regimen: warfarin 3 mg daily INR goal 2-3, managed by PCP Previously recommended to continue current regimen at this time  Supplements: Vitamin B12 5000 mcg daily;    Patient Goals/Self-Care Activities Over the next 90 days, patient will:  - take medications as prescribed check glucose daily, document, and provide at future appointments check blood pressure periodically, document, and provide at future appointments collaborate with provider on medication access solutions      Plan: Telephone follow up appointment with care management team member scheduled for:  3 days as previously scheduled  Catie Darnelle Maffucci, PharmD, [place], [place] Pharmacist [employer] at [employer] [phone]

## 2021-04-22 NOTE — Patient Instructions (Signed)
Visit Information  Following are the goals we discussed today:  Patient Goals/Self-Care Activities Over the next 90 days, patient will:  - take medications as prescribed check glucose daily, document, and provide at future appointments check blood pressure periodically, document, and provide at future appointments collaborate with provider on medication access solutions        Plan: Telephone follow up appointment with care management team member scheduled for:  3 days as previously scheduled   Catie Feliz Beam, PharmD, P[place]y [place], CPP Clinical Pharmacist [facility] at [facility] [phone]   Please call the care guide team at [phone] if you need to cancel or reschedule your appointment.   Patient verbalizes understanding of instructions provided today and agrees to view in MyChart.

## 2021-04-22 NOTE — Progress Notes (Signed)
[facility] Customer service manager [facility])                                            [facility] Quality Pharmacy Team    04/22/2021  Stephen Kelly [birth year] [id number]  FOR 2023 RE ENROLLMENT                                     Medication Assistance Referral  Referral From: [facility] Embedded RPh Catie T.   Medication/Company: [name] / [place] Patient application portion:  N/A patient should automatically be re enrolled for 2023 as patient was enrolled in 2022 and has Medicare D per program guidelines Provider application portion: Interoffice Mailed to Dr. Darrick Huntsman Provider address/fax verified via: Office website  Received provider portion(s) of patient assistance application(s) for [place]. Embedded PharmD escribed prescription to [facility] which is [place] pharmacy today.    Stephen Kelly P. Ashayla Subia, CPhT [facility] [employer]  [phone]

## 2021-04-23 DIAGNOSIS — N183 Chronic kidney disease, stage 3 unspecified: Secondary | ICD-10-CM | POA: Diagnosis not present

## 2021-04-23 DIAGNOSIS — E1122 Type 2 diabetes mellitus with diabetic chronic kidney disease: Secondary | ICD-10-CM

## 2021-04-23 DIAGNOSIS — R809 Proteinuria, unspecified: Secondary | ICD-10-CM

## 2021-04-23 DIAGNOSIS — E785 Hyperlipidemia, unspecified: Secondary | ICD-10-CM

## 2021-04-23 DIAGNOSIS — E1169 Type 2 diabetes mellitus with other specified complication: Secondary | ICD-10-CM | POA: Diagnosis not present

## 2021-04-23 DIAGNOSIS — Z7984 Long term (current) use of oral hypoglycemic drugs: Secondary | ICD-10-CM

## 2021-04-24 DIAGNOSIS — E1129 Type 2 diabetes mellitus with other diabetic kidney complication: Secondary | ICD-10-CM | POA: Diagnosis not present

## 2021-04-24 DIAGNOSIS — E1122 Type 2 diabetes mellitus with diabetic chronic kidney disease: Secondary | ICD-10-CM | POA: Diagnosis not present

## 2021-04-24 DIAGNOSIS — E782 Mixed hyperlipidemia: Secondary | ICD-10-CM | POA: Diagnosis not present

## 2021-04-24 DIAGNOSIS — I10 Essential (primary) hypertension: Secondary | ICD-10-CM | POA: Diagnosis not present

## 2021-04-25 ENCOUNTER — Telehealth: Payer: Medicare Other

## 2021-04-25 ENCOUNTER — Telehealth: Payer: Self-pay

## 2021-04-25 NOTE — Chronic Care Management (AMB) (Signed)
  Care Management   Note  04/25/2021 Name: Stephen Kelly MRN: [id number] DOB: [birth year]  Stephen Kelly is a 80 y.o. year old male who is a primary care patient of Darrick Huntsman, Mar Daring, MD and is actively engaged with the care management team. I reached out to Susa Simmonds by phone today to assist with re-scheduling a follow up visit with the Pharmacist  Follow up plan: Unsuccessful telephone outreach attempt made. A HIPAA compliant phone message was left for the patient providing contact information and requesting a return call.  The care management team will reach out to the patient again over the next 3 days.  If patient returns call to provider office, please advise to call Embedded Care Management Care Guide Penne Lash  at [phone]  Penne Lash, RMA Care Guide, Embedded Care Coordination [facility]  [place], [place] [address] Direct Dial: [phone] [email or web address]: [email or web address]

## 2021-05-01 NOTE — Chronic Care Management (AMB) (Signed)
  Care Management   Note  05/01/2021 Name: Stephen Kelly MRN: [id number] DOB: Aug 22, 1940  Stephen Kelly is a 80 y.o. year old male who is a primary care patient of Darrick Huntsman, Mar Daring, MD and is actively engaged with the care management team. I reached out to Susa Simmonds by phone today to assist with re-scheduling a follow up visit with the Pharmacist  Follow up plan: Unsuccessful telephone outreach attempt made. A HIPAA compliant phone message was left for the patient providing contact information and requesting a return call.  The care management team will reach out to the patient again over the next 5 days.  If patient returns call to provider office, please advise to call Embedded Care Management Care Guide Penne Lash  at [phone]  Penne Lash, RMA Care Guide, Embedded Care Coordination [facility]  [place], [place] [address] Direct Dial: [phone] [email or web address] Website: [email or web address]

## 2021-05-12 NOTE — Chronic Care Management (AMB) (Signed)
°  Care Management   Note  05/12/2021 Name: [name] MRN: [id number] DOB: March 21, 1941  Stephen Kelly is a 80 y.o. year old male who is a primary care patient of Darrick Huntsman, Mar Daring, MD and is actively engaged with the care management team. I reached out to Susa Simmonds by phone today to assist with re-scheduling a follow up visit with the Pharmacist  Follow up plan: Telephone appointment with care management team member scheduled for:06/23/2021  Penne Lash, RMA Care Guide, Embedded Care Coordination [facility]  [place], [place] [address] Direct Dial: [phone] [email or web address] Website: [email or web address]

## 2021-05-20 ENCOUNTER — Telehealth: Payer: Self-pay | Admitting: Internal Medicine

## 2021-05-20 ENCOUNTER — Other Ambulatory Visit: Payer: Self-pay | Admitting: *Deleted

## 2021-05-20 DIAGNOSIS — Z7901 Long term (current) use of anticoagulants: Secondary | ICD-10-CM

## 2021-05-20 NOTE — Telephone Encounter (Signed)
FYI

## 2021-05-20 NOTE — Telephone Encounter (Addendum)
Patient came into the office and was very upset that he had a lab scheduled on 05/19/2021 and that when he got to the office the building was closed. It looks like patient did not have a lab appointment scheduled on that day. He was advised that he has a 05/27/2021 INR check and a 05/30/2021 87m follow up with Dr Darrick Huntsman. Patient said ,forget it all, if you all can't get this right I will go else where. Patient was asked 2x, are you sure you want to cancel appointments and be removed from Dr Darrick Huntsman as her patient? Patient's replay was Yes.

## 2021-05-23 DIAGNOSIS — Z7960 Long term (current) use of unspecified immunomodulators and immunosuppressants: Secondary | ICD-10-CM | POA: Diagnosis not present

## 2021-05-23 DIAGNOSIS — R768 Other specified abnormal immunological findings in serum: Secondary | ICD-10-CM | POA: Insufficient documentation

## 2021-05-23 DIAGNOSIS — M0609 Rheumatoid arthritis without rheumatoid factor, multiple sites: Secondary | ICD-10-CM | POA: Diagnosis not present

## 2021-05-23 DIAGNOSIS — M06072 Rheumatoid arthritis without rheumatoid factor, left ankle and foot: Secondary | ICD-10-CM | POA: Diagnosis not present

## 2021-05-23 DIAGNOSIS — Z111 Encounter for screening for respiratory tuberculosis: Secondary | ICD-10-CM | POA: Diagnosis not present

## 2021-05-23 DIAGNOSIS — M19072 Primary osteoarthritis, left ankle and foot: Secondary | ICD-10-CM | POA: Diagnosis not present

## 2021-05-23 DIAGNOSIS — M1A09X0 Idiopathic chronic gout, multiple sites, without tophus (tophi): Secondary | ICD-10-CM | POA: Insufficient documentation

## 2021-05-23 DIAGNOSIS — M12871 Other specific arthropathies, not elsewhere classified, right ankle and foot: Secondary | ICD-10-CM | POA: Diagnosis not present

## 2021-05-23 DIAGNOSIS — M19041 Primary osteoarthritis, right hand: Secondary | ICD-10-CM | POA: Diagnosis not present

## 2021-05-27 ENCOUNTER — Other Ambulatory Visit: Payer: Medicare Other

## 2021-05-29 ENCOUNTER — Telehealth: Payer: Self-pay | Admitting: Pharmacy Technician

## 2021-05-29 DIAGNOSIS — Z596 Low income: Secondary | ICD-10-CM

## 2021-05-29 NOTE — Progress Notes (Signed)
[facility] Customer service manager [facility])                                            [facility] Quality Pharmacy Team    05/29/2021  Stephen Kelly 1940/09/08 [id number]  Care coordination call placed to [place] in regard to [name] application.  Spoke to [facility] who informs patient is re enrolled and APPROVED 05/25/21-05/24/22. She informs update prescription was received and medication will automatically be refilled and shipped out to his home based on last fill date in 2022.  Macario Shear P. Fin Hupp, CPhT [facility] [employer]  [phone]

## 2021-05-30 ENCOUNTER — Encounter: Payer: Self-pay | Admitting: Internal Medicine

## 2021-05-30 ENCOUNTER — Ambulatory Visit: Payer: Medicare Other | Admitting: Internal Medicine

## 2021-06-03 ENCOUNTER — Encounter: Payer: Self-pay | Admitting: Internal Medicine

## 2021-06-04 ENCOUNTER — Other Ambulatory Visit: Payer: Self-pay | Admitting: Internal Medicine

## 2021-06-05 ENCOUNTER — Telehealth: Payer: Self-pay | Admitting: Internal Medicine

## 2021-06-05 NOTE — Telephone Encounter (Signed)
Patient dismissed from practice 06/05/21 [facility]

## 2021-06-23 ENCOUNTER — Telehealth: Payer: Medicare Other

## 2021-07-03 ENCOUNTER — Ambulatory Visit (INDEPENDENT_AMBULATORY_CARE_PROVIDER_SITE_OTHER): Payer: Medicare Other | Admitting: Vascular Surgery

## 2021-07-03 ENCOUNTER — Other Ambulatory Visit: Payer: Self-pay

## 2021-07-03 ENCOUNTER — Other Ambulatory Visit (INDEPENDENT_AMBULATORY_CARE_PROVIDER_SITE_OTHER): Payer: Self-pay | Admitting: Vascular Surgery

## 2021-07-03 ENCOUNTER — Encounter (INDEPENDENT_AMBULATORY_CARE_PROVIDER_SITE_OTHER): Payer: Self-pay | Admitting: Vascular Surgery

## 2021-07-03 ENCOUNTER — Other Ambulatory Visit (INDEPENDENT_AMBULATORY_CARE_PROVIDER_SITE_OTHER): Payer: Medicare Other

## 2021-07-03 VITALS — BP 100/66 | HR 80 | Ht 69.0 in | Wt 164.0 lb

## 2021-07-03 DIAGNOSIS — I739 Peripheral vascular disease, unspecified: Secondary | ICD-10-CM | POA: Diagnosis not present

## 2021-07-03 DIAGNOSIS — R0989 Other specified symptoms and signs involving the circulatory and respiratory systems: Secondary | ICD-10-CM

## 2021-07-03 DIAGNOSIS — E1169 Type 2 diabetes mellitus with other specified complication: Secondary | ICD-10-CM

## 2021-07-03 DIAGNOSIS — M0609 Rheumatoid arthritis without rheumatoid factor, multiple sites: Secondary | ICD-10-CM

## 2021-07-03 DIAGNOSIS — I10 Essential (primary) hypertension: Secondary | ICD-10-CM | POA: Diagnosis not present

## 2021-07-03 DIAGNOSIS — R809 Proteinuria, unspecified: Secondary | ICD-10-CM

## 2021-07-03 DIAGNOSIS — E785 Hyperlipidemia, unspecified: Secondary | ICD-10-CM

## 2021-07-03 DIAGNOSIS — E1129 Type 2 diabetes mellitus with other diabetic kidney complication: Secondary | ICD-10-CM

## 2021-07-03 NOTE — Progress Notes (Signed)
MRN : [id number]  Stephen Kelly is a 81 y.o. (11/04/1940) male who presents with chief complaint of check circulation.  History of Present Illness:    The patient is seen for evaluation of his lower extremities and diminished pulses. Patient denies claudication.  He has moderate to severe arthritis and vascular calcifications were noted on several joint x-rays which prompted his referral.  The patient denies rest pain or dangling of an extremity off the side of the bed during the night for relief. No open wounds or sores at this time. No prior interventions or surgeries.  No history of back problems or DJD of the lumbar sacral spine.   The patient's blood pressure has been stable and relatively well controlled. The patient denies amaurosis fugax or recent TIA symptoms. There are no recent neurological changes noted. The patient denies history of DVT, PE or superficial thrombophlebitis. The patient denies recent episodes of angina or shortness of breath.    ABI's RT=[place] (Rt TBI=0.55)  and Lt=[place] (Lt TBI=0.44)  Current Meds  Medication Sig   allopurinol (ZYLOPRIM) 100 MG tablet Take 1 tablet (100 mg total) by mouth daily.   atorvastatin (LIPITOR) 20 MG tablet TAKE 1 TABLET(20 MG) BY MOUTH DAILY   Cyanocobalamin ([facility]S VITAMIN B-12) 5000 MCG SUBL Place 5,000 mcg under the tongue daily.   dapagliflozin propanediol (FARXIGA) 10 MG TABS tablet Take 1 tablet (10 mg total) by mouth daily.   hydrochlorothiazide (HYDRODIURIL) 25 MG tablet TAKE 1 TABLET(25 MG) BY MOUTH DAILY   losartan (COZAAR) 100 MG tablet TAKE 1 TABLET(100 MG) BY MOUTH DAILY   metFORMIN (GLUCOPHAGE) 500 MG tablet TAKE 1 TABLET BY MOUTH TWICE DAILY WITH A MEAL   warfarin (COUMADIN) 1 MG tablet Add to 3 mg every other day:  Current regimen 3 mg alternating with 4 mg daily   warfarin (COUMADIN) 3 MG tablet TAKE 1 TABLET(3 MG) BY MOUTH DAILY    Past Medical History:  Diagnosis Date   Clotting disorder ([place]) 2011   Lupus  Inhibitor positive by March 2012 studies   Gout, arthritis    managed with allopurinol   History of renal calculi 2001   history of lithotripsy ,  no stent   Hyperlipidemia    Hypertension    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[place])    per gitten's test March 2012   Patent foramen ovale    Pulmonary nodule, right 2011   unchanged, followed by gitten with serial ct    Past Surgical History:  Procedure Laterality Date   THROMBECTOMY     left brachicephalic artery    Social History Social History   Tobacco Use   Smoking status: Never   Smokeless tobacco: Never  Substance Use Topics   Alcohol use: No   Drug use: No    Family History Family History  Problem Relation Age of Onset   Heart disease Mother    Hypertension Mother    Diabetes Mother    Cancer Mother        ovarian   Mental illness Father        suicide vs foul play   Stroke Paternal Grandmother     Allergies  Allergen Reactions   Azithromycin Rash     REVIEW OF SYSTEMS (Negative unless checked)  Constitutional: [] Weight loss  [] Fever  [] Chills Cardiac: [] Chest pain   [] Chest pressure   [] Palpitations   [] Shortness of breath when laying flat   [] Shortness of breath with exertion. Vascular:  [] Pain  in legs with walking   [] Pain in legs at rest  [] History of DVT   [] Phlebitis   [] Swelling in legs   [] Varicose veins   [] Non-healing ulcers Pulmonary:   [] Uses home oxygen   [] Productive cough   [] Hemoptysis   [] Wheeze  [] COPD   [] Asthma Neurologic:  [] Dizziness   [] Seizures   [] History of stroke   [] History of TIA  [] Aphasia   [] Vissual changes   [] Weakness or numbness in arm   [] Weakness or numbness in leg Musculoskeletal:   [] Joint swelling   [x] Joint pain   [] Low back pain Hematologic:  [] Easy bruising  [] Easy bleeding   [] Hypercoagulable state   [] Anemic Gastrointestinal:  [] Diarrhea   [] Vomiting  [] Gastroesophageal reflux/heartburn   [] Difficulty swallowing. Genitourinary:  [] Chronic kidney disease    [] Difficult urination  [] Frequent urination   [] Blood in urine Skin:  [] Rashes   [] Ulcers  Psychological:  [] History of anxiety   []  History of major depression.  Physical Examination  Vitals:   07/03/21 0947  BP: 100/66  Pulse: 80  Weight: 164 lb (74.4 kg)  Height: 5\' 9"  (1.753 m)   Body mass index is 24.22 kg/m. Gen: WD/WN, NAD Head: [place]/AT, No temporalis wasting.  Ear/Nose/Throat: Hearing grossly intact, nares w/o erythema or drainage Eyes: PER, EOMI, sclera nonicteric.  Neck: Supple, no masses.  No bruit or JVD.  Pulmonary:  Good air movement, no audible wheezing, no use of accessory muscles.  Cardiac: RRR, normal S1, S2, harsh pan systolic Murmur. Vascular:  bilateral carotid bruits Vessel Right Left  Radial Palpable Palpable  Carotid Palpable Palpable  PT Not Palpable Not Palpable  DP Not Palpable Not Palpable  Gastrointestinal: soft, non-distended. No guarding/no peritoneal signs.  Musculoskeletal: M/S 5/5 throughout.  No visible deformity.  Neurologic: CN 2-12 intact. Pain and light touch intact in extremities.  Symmetrical.  Speech is fluent. Motor exam as listed above. Psychiatric: Judgment intact, Mood & affect appropriate for pt's clinical situation. Dermatologic: No rashes or ulcers noted.  No changes consistent with cellulitis.   CBC Lab Results  Component Value Date   WBC 9.0 09/27/2019   HGB 12.9 (L) 09/27/2019   HCT 37.9 (L) 09/27/2019   MCV 92.2 09/27/2019   PLT 233.0 09/27/2019    BMET    Component Value Date/Time   NA 138 03/24/2021 0752   K 3.9 03/24/2021 0752   CL 102 03/24/2021 0752   CO2 25 03/24/2021 0752   GLUCOSE 134 (H) 03/24/2021 0752   BUN 34 (H) 03/24/2021 0752   CREATININE 1.65 (H) 03/24/2021 0752   CREATININE 1.28 08/31/2011 1706   CALCIUM 9.7 03/24/2021 0752   GFRNONAA 33 (L) 01/17/2019 0936   GFRNONAA 54 (L) 08/31/2011 1115   GFRAA 39 (L) 01/17/2019 0936   GFRAA 62 08/31/2011 1115   CrCl cannot be calculated (Patient's  most recent lab result is older than the maximum 21 days allowed.).  COAG Lab Results  Component Value Date   INR 2.1 (H) 04/22/2021   INR 3.5 (H) 04/15/2021   INR 1.8 (H) 03/24/2021    Radiology No results found.   Assessment/Plan 1. PAD (peripheral artery disease) (HCC)  Recommend:  The patient has evidence of atherosclerosis of the lower extremities with claudication.  The patient does not voice lifestyle limiting changes at this point in time.  Noninvasive studies do not suggest clinically significant disease.  No invasive studies, angiography or surgery at this time The patient should continue walking and begin a more formal exercise  program.  The patient should continue antiplatelet therapy and aggressive treatment of the lipid abnormalities  No changes in the patient's medications at this time   - VAS [place] ABI WITH/WO TBI; Future  2. Bilateral carotid bruits Recommend:  Given the patient's asymptomatic subcritical stenosis no further invasive testing or surgery at this time.  Duplex ultrasound will be obtained given his bruits.  Continue antiplatelet therapy as prescribed Continue management of CAD, HTN and Hyperlipidemia Healthy heart diet,  encouraged exercise at least 4 times per week Follow up in 6 months with duplex ultrasound and physical exam.    - VAS US CAROTID; Future  3. Primary hypertension Continue antihypertensive medications as already ordered, these medications have been reviewed and there are no changes at this time.   4. Hyperlipidemia associated with type 2 diabetes mellitus ([place]) Continue statin as ordered and reviewed, no changes at this time   5. Type 2 diabetes mellitus with microalbuminuria, without long-term current use of insulin (HCC) Continue hypoglycemic medications as already ordered, these medications have been reviewed and there are no changes at this time.  Hgb A1C to be monitored as already arranged by primary service   6.  Rheumatoid arthritis of multiple sites with negative rheumatoid factor (HCC) Continue NSAID medications as already ordered, these medications have been reviewed and there are no changes at this time.  Continued activity and therapy was stressed.     Hortencia Pilar, MD  07/03/2021 9:55 AM

## 2021-07-08 DIAGNOSIS — R768 Other specified abnormal immunological findings in serum: Secondary | ICD-10-CM | POA: Diagnosis not present

## 2021-07-08 DIAGNOSIS — Z7960 Long term (current) use of unspecified immunomodulators and immunosuppressants: Secondary | ICD-10-CM | POA: Diagnosis not present

## 2021-07-08 DIAGNOSIS — M0609 Rheumatoid arthritis without rheumatoid factor, multiple sites: Secondary | ICD-10-CM | POA: Diagnosis not present

## 2021-07-08 DIAGNOSIS — M1A09X0 Idiopathic chronic gout, multiple sites, without tophus (tophi): Secondary | ICD-10-CM | POA: Diagnosis not present

## 2021-07-09 ENCOUNTER — Telehealth: Payer: Self-pay | Admitting: Internal Medicine

## 2021-07-09 NOTE — Telephone Encounter (Signed)
Patient dismissed from [facility] 06/03/21

## 2021-07-24 DIAGNOSIS — Z7960 Long term (current) use of unspecified immunomodulators and immunosuppressants: Secondary | ICD-10-CM | POA: Diagnosis not present

## 2021-07-24 DIAGNOSIS — M0609 Rheumatoid arthritis without rheumatoid factor, multiple sites: Secondary | ICD-10-CM | POA: Diagnosis not present

## 2021-08-28 DIAGNOSIS — E1129 Type 2 diabetes mellitus with other diabetic kidney complication: Secondary | ICD-10-CM | POA: Diagnosis not present

## 2021-08-28 DIAGNOSIS — E1169 Type 2 diabetes mellitus with other specified complication: Secondary | ICD-10-CM | POA: Diagnosis not present

## 2021-08-28 DIAGNOSIS — Z87442 Personal history of urinary calculi: Secondary | ICD-10-CM | POA: Diagnosis not present

## 2021-08-28 DIAGNOSIS — I10 Essential (primary) hypertension: Secondary | ICD-10-CM | POA: Diagnosis not present

## 2021-08-28 DIAGNOSIS — E1122 Type 2 diabetes mellitus with diabetic chronic kidney disease: Secondary | ICD-10-CM | POA: Diagnosis not present

## 2021-09-02 ENCOUNTER — Other Ambulatory Visit: Payer: Self-pay | Admitting: Internal Medicine

## 2021-09-17 DIAGNOSIS — Z7960 Long term (current) use of unspecified immunomodulators and immunosuppressants: Secondary | ICD-10-CM | POA: Diagnosis not present

## 2021-09-17 DIAGNOSIS — M1A09X0 Idiopathic chronic gout, multiple sites, without tophus (tophi): Secondary | ICD-10-CM | POA: Diagnosis not present

## 2021-09-17 DIAGNOSIS — M0609 Rheumatoid arthritis without rheumatoid factor, multiple sites: Secondary | ICD-10-CM | POA: Diagnosis not present

## 2021-10-16 DIAGNOSIS — R7303 Prediabetes: Secondary | ICD-10-CM | POA: Diagnosis not present

## 2021-10-16 DIAGNOSIS — M1A09X0 Idiopathic chronic gout, multiple sites, without tophus (tophi): Secondary | ICD-10-CM | POA: Diagnosis not present

## 2021-11-17 DIAGNOSIS — Z0000 Encounter for general adult medical examination without abnormal findings: Secondary | ICD-10-CM | POA: Diagnosis not present

## 2021-11-17 DIAGNOSIS — R011 Cardiac murmur, unspecified: Secondary | ICD-10-CM | POA: Diagnosis not present

## 2021-11-17 DIAGNOSIS — E7800 Pure hypercholesterolemia, unspecified: Secondary | ICD-10-CM | POA: Diagnosis not present

## 2021-11-17 DIAGNOSIS — E1122 Type 2 diabetes mellitus with diabetic chronic kidney disease: Secondary | ICD-10-CM | POA: Diagnosis not present

## 2021-12-25 DIAGNOSIS — R011 Cardiac murmur, unspecified: Secondary | ICD-10-CM | POA: Diagnosis not present

## 2021-12-29 ENCOUNTER — Encounter (INDEPENDENT_AMBULATORY_CARE_PROVIDER_SITE_OTHER): Payer: Medicare Other

## 2021-12-29 ENCOUNTER — Ambulatory Visit (INDEPENDENT_AMBULATORY_CARE_PROVIDER_SITE_OTHER): Payer: Medicare Other | Admitting: Vascular Surgery

## 2021-12-31 DIAGNOSIS — R801 Persistent proteinuria, unspecified: Secondary | ICD-10-CM | POA: Diagnosis not present

## 2021-12-31 DIAGNOSIS — I10 Essential (primary) hypertension: Secondary | ICD-10-CM | POA: Diagnosis not present

## 2021-12-31 DIAGNOSIS — E1122 Type 2 diabetes mellitus with diabetic chronic kidney disease: Secondary | ICD-10-CM | POA: Diagnosis not present

## 2021-12-31 DIAGNOSIS — E1129 Type 2 diabetes mellitus with other diabetic kidney complication: Secondary | ICD-10-CM | POA: Diagnosis not present

## 2022-01-07 DIAGNOSIS — E782 Mixed hyperlipidemia: Secondary | ICD-10-CM | POA: Diagnosis not present

## 2022-01-07 DIAGNOSIS — R809 Proteinuria, unspecified: Secondary | ICD-10-CM | POA: Diagnosis not present

## 2022-01-07 DIAGNOSIS — M0549 Rheumatoid myopathy with rheumatoid arthritis of multiple sites: Secondary | ICD-10-CM | POA: Diagnosis not present

## 2022-01-07 DIAGNOSIS — N1832 Chronic kidney disease, stage 3b: Secondary | ICD-10-CM | POA: Diagnosis not present

## 2022-01-08 DIAGNOSIS — Z85828 Personal history of other malignant neoplasm of skin: Secondary | ICD-10-CM | POA: Diagnosis not present

## 2022-01-08 DIAGNOSIS — D2262 Melanocytic nevi of left upper limb, including shoulder: Secondary | ICD-10-CM | POA: Diagnosis not present

## 2022-01-08 DIAGNOSIS — D2272 Melanocytic nevi of left lower limb, including hip: Secondary | ICD-10-CM | POA: Diagnosis not present

## 2022-01-08 DIAGNOSIS — D225 Melanocytic nevi of trunk: Secondary | ICD-10-CM | POA: Diagnosis not present

## 2022-01-08 DIAGNOSIS — D0462 Carcinoma in situ of skin of left upper limb, including shoulder: Secondary | ICD-10-CM | POA: Diagnosis not present

## 2022-01-20 DIAGNOSIS — M0609 Rheumatoid arthritis without rheumatoid factor, multiple sites: Secondary | ICD-10-CM | POA: Diagnosis not present

## 2022-01-20 DIAGNOSIS — M1A09X0 Idiopathic chronic gout, multiple sites, without tophus (tophi): Secondary | ICD-10-CM | POA: Diagnosis not present

## 2022-01-20 DIAGNOSIS — Z7960 Long term (current) use of unspecified immunomodulators and immunosuppressants: Secondary | ICD-10-CM | POA: Diagnosis not present

## 2022-01-28 DIAGNOSIS — E7800 Pure hypercholesterolemia, unspecified: Secondary | ICD-10-CM | POA: Diagnosis not present

## 2022-01-28 DIAGNOSIS — I350 Nonrheumatic aortic (valve) stenosis: Secondary | ICD-10-CM | POA: Diagnosis not present

## 2022-01-28 DIAGNOSIS — E1122 Type 2 diabetes mellitus with diabetic chronic kidney disease: Secondary | ICD-10-CM | POA: Diagnosis not present

## 2022-01-28 DIAGNOSIS — I739 Peripheral vascular disease, unspecified: Secondary | ICD-10-CM | POA: Diagnosis not present

## 2022-02-10 DIAGNOSIS — I350 Nonrheumatic aortic (valve) stenosis: Secondary | ICD-10-CM | POA: Diagnosis not present

## 2022-02-10 DIAGNOSIS — R9431 Abnormal electrocardiogram [ECG] [EKG]: Secondary | ICD-10-CM | POA: Diagnosis not present

## 2022-02-18 DIAGNOSIS — Z23 Encounter for immunization: Secondary | ICD-10-CM | POA: Diagnosis not present

## 2022-02-19 DIAGNOSIS — C44729 Squamous cell carcinoma of skin of left lower limb, including hip: Secondary | ICD-10-CM | POA: Diagnosis not present

## 2022-02-26 DIAGNOSIS — L089 Local infection of the skin and subcutaneous tissue, unspecified: Secondary | ICD-10-CM | POA: Diagnosis not present

## 2022-02-26 DIAGNOSIS — C44629 Squamous cell carcinoma of skin of left upper limb, including shoulder: Secondary | ICD-10-CM | POA: Diagnosis not present

## 2022-03-05 DIAGNOSIS — I350 Nonrheumatic aortic (valve) stenosis: Secondary | ICD-10-CM | POA: Diagnosis not present

## 2022-03-05 DIAGNOSIS — E7800 Pure hypercholesterolemia, unspecified: Secondary | ICD-10-CM | POA: Diagnosis not present

## 2022-03-19 DIAGNOSIS — E1122 Type 2 diabetes mellitus with diabetic chronic kidney disease: Secondary | ICD-10-CM | POA: Diagnosis not present

## 2022-03-19 DIAGNOSIS — M0609 Rheumatoid arthritis without rheumatoid factor, multiple sites: Secondary | ICD-10-CM | POA: Diagnosis not present

## 2022-03-19 DIAGNOSIS — E1151 Type 2 diabetes mellitus with diabetic peripheral angiopathy without gangrene: Secondary | ICD-10-CM | POA: Diagnosis not present

## 2022-03-19 DIAGNOSIS — E7800 Pure hypercholesterolemia, unspecified: Secondary | ICD-10-CM | POA: Diagnosis not present

## 2022-05-06 DIAGNOSIS — E782 Mixed hyperlipidemia: Secondary | ICD-10-CM | POA: Diagnosis not present

## 2022-05-06 DIAGNOSIS — E1169 Type 2 diabetes mellitus with other specified complication: Secondary | ICD-10-CM | POA: Diagnosis not present

## 2022-05-06 DIAGNOSIS — M0549 Rheumatoid myopathy with rheumatoid arthritis of multiple sites: Secondary | ICD-10-CM | POA: Diagnosis not present

## 2022-05-06 DIAGNOSIS — R809 Proteinuria, unspecified: Secondary | ICD-10-CM | POA: Diagnosis not present

## 2022-05-13 DIAGNOSIS — N1832 Chronic kidney disease, stage 3b: Secondary | ICD-10-CM | POA: Diagnosis not present

## 2022-05-13 DIAGNOSIS — D631 Anemia in chronic kidney disease: Secondary | ICD-10-CM | POA: Diagnosis not present

## 2022-05-13 DIAGNOSIS — E782 Mixed hyperlipidemia: Secondary | ICD-10-CM | POA: Diagnosis not present

## 2022-05-13 DIAGNOSIS — R809 Proteinuria, unspecified: Secondary | ICD-10-CM | POA: Diagnosis not present

## 2022-07-20 DIAGNOSIS — N1832 Chronic kidney disease, stage 3b: Secondary | ICD-10-CM | POA: Diagnosis not present

## 2022-07-20 DIAGNOSIS — M0609 Rheumatoid arthritis without rheumatoid factor, multiple sites: Secondary | ICD-10-CM | POA: Diagnosis not present

## 2022-07-20 DIAGNOSIS — E7800 Pure hypercholesterolemia, unspecified: Secondary | ICD-10-CM | POA: Diagnosis not present

## 2022-07-20 DIAGNOSIS — E1122 Type 2 diabetes mellitus with diabetic chronic kidney disease: Secondary | ICD-10-CM | POA: Diagnosis not present

## 2022-07-21 DIAGNOSIS — L570 Actinic keratosis: Secondary | ICD-10-CM | POA: Diagnosis not present

## 2022-07-21 DIAGNOSIS — C44629 Squamous cell carcinoma of skin of left upper limb, including shoulder: Secondary | ICD-10-CM | POA: Diagnosis not present

## 2022-07-21 DIAGNOSIS — D2261 Melanocytic nevi of right upper limb, including shoulder: Secondary | ICD-10-CM | POA: Diagnosis not present

## 2022-07-21 DIAGNOSIS — X32XXXA Exposure to sunlight, initial encounter: Secondary | ICD-10-CM | POA: Diagnosis not present

## 2022-07-21 DIAGNOSIS — C44222 Squamous cell carcinoma of skin of right ear and external auricular canal: Secondary | ICD-10-CM | POA: Diagnosis not present

## 2022-07-21 DIAGNOSIS — Z85828 Personal history of other malignant neoplasm of skin: Secondary | ICD-10-CM | POA: Diagnosis not present

## 2022-07-21 DIAGNOSIS — D2262 Melanocytic nevi of left upper limb, including shoulder: Secondary | ICD-10-CM | POA: Diagnosis not present

## 2022-07-21 DIAGNOSIS — D2271 Melanocytic nevi of right lower limb, including hip: Secondary | ICD-10-CM | POA: Diagnosis not present

## 2022-07-21 DIAGNOSIS — D485 Neoplasm of uncertain behavior of skin: Secondary | ICD-10-CM | POA: Diagnosis not present

## 2022-07-30 DIAGNOSIS — C44222 Squamous cell carcinoma of skin of right ear and external auricular canal: Secondary | ICD-10-CM | POA: Diagnosis not present

## 2022-09-01 DIAGNOSIS — E1129 Type 2 diabetes mellitus with other diabetic kidney complication: Secondary | ICD-10-CM | POA: Diagnosis not present

## 2022-09-01 DIAGNOSIS — E1169 Type 2 diabetes mellitus with other specified complication: Secondary | ICD-10-CM | POA: Diagnosis not present

## 2022-09-01 DIAGNOSIS — E1122 Type 2 diabetes mellitus with diabetic chronic kidney disease: Secondary | ICD-10-CM | POA: Diagnosis not present

## 2022-09-01 DIAGNOSIS — N1832 Chronic kidney disease, stage 3b: Secondary | ICD-10-CM | POA: Diagnosis not present

## 2022-09-08 DIAGNOSIS — E875 Hyperkalemia: Secondary | ICD-10-CM | POA: Diagnosis not present

## 2022-09-08 DIAGNOSIS — E1122 Type 2 diabetes mellitus with diabetic chronic kidney disease: Secondary | ICD-10-CM | POA: Diagnosis not present

## 2022-09-08 DIAGNOSIS — I10 Essential (primary) hypertension: Secondary | ICD-10-CM | POA: Diagnosis not present

## 2022-09-08 DIAGNOSIS — D631 Anemia in chronic kidney disease: Secondary | ICD-10-CM | POA: Diagnosis not present

## 2022-09-29 DIAGNOSIS — D2362 Other benign neoplasm of skin of left upper limb, including shoulder: Secondary | ICD-10-CM | POA: Diagnosis not present

## 2022-09-29 DIAGNOSIS — C44629 Squamous cell carcinoma of skin of left upper limb, including shoulder: Secondary | ICD-10-CM | POA: Diagnosis not present

## 2022-11-04 DIAGNOSIS — E7800 Pure hypercholesterolemia, unspecified: Secondary | ICD-10-CM | POA: Diagnosis not present

## 2022-11-04 DIAGNOSIS — E1122 Type 2 diabetes mellitus with diabetic chronic kidney disease: Secondary | ICD-10-CM | POA: Diagnosis not present

## 2022-11-04 DIAGNOSIS — D84821 Immunodeficiency due to drugs: Secondary | ICD-10-CM | POA: Diagnosis not present

## 2022-11-04 DIAGNOSIS — I10 Essential (primary) hypertension: Secondary | ICD-10-CM | POA: Diagnosis not present
